# Patient Record
Sex: Female | Born: 1980 | Race: White | Hispanic: No | Smoking: Never smoker
Health system: Southern US, Community
[De-identification: ages and names within clinical notes are randomized; demographics above are authoritative.]

## PROBLEM LIST (undated history)

## (undated) DIAGNOSIS — E785 Hyperlipidemia, unspecified: Secondary | ICD-10-CM

## (undated) DIAGNOSIS — F419 Anxiety disorder, unspecified: Secondary | ICD-10-CM

## (undated) DIAGNOSIS — K469 Unspecified abdominal hernia without obstruction or gangrene: Secondary | ICD-10-CM

## (undated) DIAGNOSIS — G43909 Migraine, unspecified, not intractable, without status migrainosus: Secondary | ICD-10-CM

## (undated) DIAGNOSIS — F32A Depression, unspecified: Secondary | ICD-10-CM

## (undated) DIAGNOSIS — N200 Calculus of kidney: Secondary | ICD-10-CM

## (undated) DIAGNOSIS — F4001 Agoraphobia with panic disorder: Secondary | ICD-10-CM

## (undated) HISTORY — DX: Anxiety disorder, unspecified: F41.9

## (undated) HISTORY — DX: Unspecified abdominal hernia without obstruction or gangrene: K46.9

## (undated) HISTORY — PX: DIAGNOSTIC LAPAROSCOPY: SUR761

## (undated) HISTORY — DX: Calculus of kidney: N20.0

## (undated) HISTORY — DX: Migraine, unspecified, not intractable, without status migrainosus: G43.909

## (undated) HISTORY — DX: Depression, unspecified: F32.A

## (undated) HISTORY — DX: Agoraphobia with panic disorder: F40.01

## (undated) HISTORY — DX: Hyperlipidemia, unspecified: E78.5

---
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
## 2005-05-20 HISTORY — PX: TONSILLECTOMY AND ADENOIDECTOMY: SUR1326

## 2006-12-01 ENCOUNTER — Ambulatory Visit: Payer: Self-pay | Admitting: Urology

## 2010-09-05 ENCOUNTER — Other Ambulatory Visit (HOSPITAL_COMMUNITY): Payer: Self-pay | Admitting: General Surgery

## 2010-09-10 ENCOUNTER — Encounter: Payer: Federal, State, Local not specified - PPO | Attending: General Surgery | Admitting: *Deleted

## 2010-09-10 DIAGNOSIS — Z713 Dietary counseling and surveillance: Secondary | ICD-10-CM | POA: Insufficient documentation

## 2010-09-10 DIAGNOSIS — Z01818 Encounter for other preprocedural examination: Secondary | ICD-10-CM | POA: Insufficient documentation

## 2010-09-17 ENCOUNTER — Ambulatory Visit (HOSPITAL_COMMUNITY)
Admission: RE | Admit: 2010-09-17 | Discharge: 2010-09-17 | Disposition: A | Discharge status: Home / Self Care | Payer: BC Managed Care – PPO | Source: Ambulatory Visit | Attending: General Surgery | Admitting: General Surgery

## 2010-09-17 DIAGNOSIS — K7689 Other specified diseases of liver: Secondary | ICD-10-CM | POA: Insufficient documentation

## 2010-09-17 DIAGNOSIS — Z01810 Encounter for preprocedural cardiovascular examination: Secondary | ICD-10-CM | POA: Insufficient documentation

## 2010-09-17 DIAGNOSIS — Z01818 Encounter for other preprocedural examination: Secondary | ICD-10-CM | POA: Insufficient documentation

## 2010-10-19 ENCOUNTER — Encounter (INDEPENDENT_AMBULATORY_CARE_PROVIDER_SITE_OTHER): Payer: Self-pay | Admitting: General Surgery

## 2010-12-27 ENCOUNTER — Encounter: Payer: BC Managed Care – PPO | Attending: General Surgery

## 2010-12-27 DIAGNOSIS — Z01818 Encounter for other preprocedural examination: Secondary | ICD-10-CM | POA: Insufficient documentation

## 2010-12-27 DIAGNOSIS — Z713 Dietary counseling and surveillance: Secondary | ICD-10-CM | POA: Insufficient documentation

## 2010-12-27 NOTE — Progress Notes (Signed)
  Pre-Operative Nutrition Class  Patient was seen on 12/27/2010 for Pre-Operative Nutrition education at the [facility].   Surgery date: 01/08/11 Surgery type: LAGB  Weight today: 283.2 lbs Weight change: 7.6 lbs gain Total weight lost: N/A BMI: 45.7  Samples given per MNT protocol: Bariatric Advantage Multivitamin Lot # [id number] Exp: 12/12  Bariatric Advantage Calcium Citrate Lot # [id number] Exp: 9/13  Celebrate Vitamins Multivitamin Lot # [id number] Exp: 9/13  Celebrate VitaminsCalcium Citrate Lot #[id number] Exp: 4/13  The following the learning objective met by the patient during this course:   Identifies Pre-Op Dietary Goals and will begin 2 weeks pre-operatively   Identifies appropriate sources of fluids and proteins   States protein recommendations and appropriate sources pre and post-operatively  Identifies Post-Operative Dietary Goals and will follow for 2 weeks post-operatively  Identifies appropriate multivitamin and calcium sources  Describes the need for physical activity post-operatively and will follow MD recommendations  States when to call healthcare provider regarding medication questions or post-operative complications  Follow-Up Plan: Patient will follow-up at [facility] 2 weeks post operatively for diet advancement per MD.

## 2010-12-28 ENCOUNTER — Encounter (INDEPENDENT_AMBULATORY_CARE_PROVIDER_SITE_OTHER): Payer: Self-pay | Admitting: General Surgery

## 2010-12-28 ENCOUNTER — Ambulatory Visit (INDEPENDENT_AMBULATORY_CARE_PROVIDER_SITE_OTHER): Payer: BC Managed Care – PPO | Admitting: General Surgery

## 2010-12-28 VITALS — BP 110/86 | HR 80 | Ht 66.0 in | Wt 285.0 lb

## 2010-12-28 NOTE — Progress Notes (Signed)
Kristen [name] is a 29 y.o. female.    Chief Complaint  Patient presents with  . Bariatric Pre-op    HPI HPI 30 year old morbidly obese Caucasian female comes in today for her preoperative visit for lap band surgery. The patient is currently scheduled for surgery on August 21. I last saw her on September 05, 2010.  She denies any new hospitalizations or surgeries. She did see her urologist and was found to have an additional kidney stone that has not required any additional treatment. She just started her preoperative diet yesterday.  Past medical, surgical, family, and social histories are reviewed and unchanged.  Past Medical History  Diagnosis Date  . Kidney stone   . Hernia   . Wears glasses   . Anxiety   . Obesity, Class III, BMI 40-49.9 (morbid obesity)   . Migraine headache   . Dyslipidemia     not requiring meds  . Fatty liver disease, nonalcoholic     Past Surgical History  Procedure Date  . Hernia repair 1985  . Tonsillectomy and adenoidectomy 2007  . Intrauterine device insertion 2011    Family History  Problem Relation Age of Onset  . Hypertension Father   . Stroke Father   . Hyperlipidemia Father   . Migraines Mother   . Cancer Mother     cervical  . Hyperlipidemia Mother   . Bipolar disorder Sister   . Migraines Sister     Social History History  Substance Use Topics  . Smoking status: Never Smoker   . Smokeless tobacco: Not on file  . Alcohol Use: Yes     "socially"    No Known Allergies  Current Outpatient Prescriptions  Medication Sig Dispense Refill  . ALPRAZolam (XANAX) 1 MG tablet Take 1 mg by mouth at bedtime as needed.        Marland Kitchen amitriptyline (ELAVIL) 25 MG tablet Take 50 mg by mouth at bedtime.       Marland Kitchen levonorgestrel (MIRENA) 20 MCG/24HR IUD 1 each by Intrauterine route once.          Review of Systems Review of Systems  Constitutional: Negative for fever, chills and weight loss.  HENT: Negative for nosebleeds and sore throat.     Eyes: Negative for blurred vision and double vision.  Respiratory: Negative for shortness of breath.   Cardiovascular: Negative for chest pain, orthopnea, leg swelling and PND.  Gastrointestinal: Negative for nausea, vomiting, diarrhea, constipation, blood in stool and melena.  Genitourinary: Negative for dysuria and hematuria.       H/o kidney stones  Musculoskeletal: Positive for joint pain (some occass wrist pain).  Skin: Negative for itching and rash.  Neurological: Positive for headaches (occass migraines).  Endo/Heme/Allergies: Negative.   Psychiatric/Behavioral: Negative.     Physical Exam Physical Exam  Vitals reviewed. Constitutional: She is oriented to person, place, and time. She appears well-developed and well-nourished.       obese  HENT:  Head: Normocephalic and atraumatic.  Eyes: Conjunctivae are normal. No scleral icterus.  Neck: Normal range of motion. Neck supple. No thyromegaly present.  Cardiovascular: Normal rate, regular rhythm and normal heart sounds.   Respiratory: Effort normal and breath sounds normal. She has no wheezes.  GI: Soft. Bowel sounds are normal. She exhibits no distension and no mass. There is no tenderness. There is no guarding.       Obese; waistline below umbilicus  Musculoskeletal: Normal range of motion.  Neurological: She is alert and oriented to person,  place, and time. She exhibits abnormal muscle tone.  Skin: Skin is warm and dry. No rash noted.  Psychiatric: She has a normal mood and affect. Her behavior is normal. Judgment and thought content normal.     Blood pressure 110/86, pulse 80, height 5\' 6"  (1.676 m), weight 285 lb (129.275 kg).  DATA REVIEWED: UPPER GI SERIES WITH KUB 09/05/2010  Technique: Routine upper GI series was performed with thin and  high density barium.  Comparison: None.  Findings: The KUB reveals a normal stool and bowel gas pattern.  There is no gross organomegaly. The osseous structures are   unremarkable.  The esophagus, stomach, duodenal bulb, and remainder of the C-loop  have a normal appearance with no evidence of stricture, fixed  filling defect, ulceration.   IMPRESSION:  Normal upper GI.  ABDOMINAL ULTRASOUND COMPLETE 09/05/10 Comparison: None.  Findings:  Gallbladder: No gallstones, gallbladder wall thickening, or  pericholecystic fluid.  Common Bile Duct: Within normal limits in caliber. Measures 3 mm.  Liver: No focal mass lesion identified. There is diffuse fatty  infiltration of the liver.  IVC: Appears normal.  Pancreas: No abnormality identified.  Spleen: Within normal limits in size and echotexture.  Right kidney: Normal in size and parenchymal echogenicity. No  evidence of mass or hydronephrosis.  Left kidney: Normal in size and parenchymal echogenicity. No  evidence of mass or hydronephrosis.  Abdominal Aorta: No aneurysm identified.   IMPRESSION: There is diffuse fatty infiltration the liver.   LABS from 09/06/10; ldl 138, ast49, alt 95, cholesterol 203 otherwise normal.     Assessment/Plan 30 year old morbidly obese Caucasian female with a BMI of 46 his comorbidities include dyslipidemia not requiring medications, bilateral wrist pain, fatty liver infiltration, and migraine headaches who is currently scheduled for laparoscopic adjustable gastric band placement on August 21.  I reviewed her upper GI and abdominal ultrasound with the patient.  I encouraged her to be compliant with her preoperative diet.  We discussed the risk and benefits of surgery again. We also discussed the typical postoperative course.  Mary Sella. Andrey Campanile, MD  Atilano Ina 12/28/2010, 6:06 PM

## 2010-12-28 NOTE — Patient Instructions (Signed)
Follow your preop diet as outlined by the nutritionist.  Try to exercise over the next week.

## 2011-01-01 ENCOUNTER — Encounter (HOSPITAL_COMMUNITY): Payer: BC Managed Care – PPO

## 2011-01-01 ENCOUNTER — Other Ambulatory Visit (INDEPENDENT_AMBULATORY_CARE_PROVIDER_SITE_OTHER): Payer: Self-pay | Admitting: General Surgery

## 2011-01-01 LAB — CBC
MCH: 31.3 pg (ref 26.0–34.0)
MCHC: 35.5 g/dL (ref 30.0–36.0)
MCV: 88.3 fL (ref 78.0–100.0)
Platelets: 288 10*3/uL (ref 150–400)

## 2011-01-01 LAB — DIFFERENTIAL
Basophils Relative: 0 % (ref 0–1)
Eosinophils Absolute: 0.1 10*3/uL (ref 0.0–0.7)
Eosinophils Relative: 1 % (ref 0–5)
Monocytes Relative: 8 % (ref 3–12)
Neutrophils Relative %: 53 % (ref 43–77)

## 2011-01-01 LAB — SURGICAL PCR SCREEN
MRSA, PCR: NEGATIVE
Staphylococcus aureus: POSITIVE — AB

## 2011-01-01 LAB — COMPREHENSIVE METABOLIC PANEL
ALT: 134 U/L — ABNORMAL HIGH (ref 0–35)
Calcium: 9.7 mg/dL (ref 8.4–10.5)
Creatinine, Ser: 0.48 mg/dL — ABNORMAL LOW (ref 0.50–1.10)
GFR calc Af Amer: >60 mL/min (ref >60)
GFR calc non Af Amer: >60 mL/min (ref >60)
Glucose, Bld: 86 mg/dL (ref 70–99)
Sodium: 137 mEq/L (ref 135–145)
Total Protein: 7.4 g/dL (ref 6.0–8.3)

## 2011-01-01 LAB — HCG, SERUM, QUALITATIVE: Preg, Serum: NEGATIVE

## 2011-01-07 ENCOUNTER — Telehealth (INDEPENDENT_AMBULATORY_CARE_PROVIDER_SITE_OTHER): Payer: Self-pay | Admitting: General Surgery

## 2011-01-07 NOTE — Telephone Encounter (Signed)
Message copied by Liliana Cline on Mon Jan 07, 2011  2:58 PM ------      Message from: Andrey Campanile, ERIC M      Created: Mon Jan 07, 2011  2:29 PM       Labs ok for surgery

## 2011-01-07 NOTE — Telephone Encounter (Signed)
Labs ok for surgery faxed to [facility].

## 2011-01-08 ENCOUNTER — Ambulatory Visit (HOSPITAL_COMMUNITY)
Admission: RE | Admit: 2011-01-08 | Discharge: 2011-01-09 | Disposition: A | Discharge status: Home / Self Care | Payer: BC Managed Care – PPO | Source: Ambulatory Visit | Attending: General Surgery | Admitting: General Surgery

## 2011-01-08 ENCOUNTER — Ambulatory Visit (HOSPITAL_COMMUNITY): Payer: BC Managed Care – PPO

## 2011-01-08 DIAGNOSIS — Z79899 Other long term (current) drug therapy: Secondary | ICD-10-CM | POA: Insufficient documentation

## 2011-01-08 DIAGNOSIS — E785 Hyperlipidemia, unspecified: Secondary | ICD-10-CM | POA: Insufficient documentation

## 2011-01-08 DIAGNOSIS — M25539 Pain in unspecified wrist: Secondary | ICD-10-CM | POA: Insufficient documentation

## 2011-01-08 DIAGNOSIS — Z6841 Body Mass Index (BMI) 40.0 and over, adult: Secondary | ICD-10-CM

## 2011-01-08 DIAGNOSIS — Z01812 Encounter for preprocedural laboratory examination: Secondary | ICD-10-CM | POA: Insufficient documentation

## 2011-01-08 DIAGNOSIS — K7689 Other specified diseases of liver: Secondary | ICD-10-CM | POA: Insufficient documentation

## 2011-01-09 NOTE — Op Note (Signed)
Kristen, [name] NO.:  [id number]  MEDICAL RECORD NO.:  [id number]  LOCATION:  1524                         FACILITY:  [facility]  PHYSICIAN:  Mary Sella. Andrey Campanile, MD     DATE OF BIRTH:  08/17/1980  DATE OF PROCEDURE:  01/08/2011 DATE OF DISCHARGE:                              OPERATIVE REPORT   PREOPERATIVE DIAGNOSES: 1. Morbid obesity, body mass index 46. 2. Fatty liver disease. 3. Dyslipidemia. 4. Bilateral wrist pain.  POSTOPERATIVE DIAGNOSES: 1. Morbid obesity, body mass index 46. 2. Fatty liver disease. 3. Dyslipidemia. 4. Bilateral wrist pain.  PROCEDURE:  Laparoscopic adjustable gastric band placement.  SURGEON:  Gaynelle Adu, MD  ASSISTANT SURGEON:  Sandria Bales. Ezzard Standing, M.D.  ANESTHESIA:  General plus 30 mL Exparel.  FINDINGS:  We used an AP Standard band.  There was no hiatal hernia.  It was placed using the pars flaccida technique.  ESTIMATED BLOOD LOSS:  Minimal.  DRAINS:  None.  INDICATIONS FOR PROCEDURE:  The patient is a 30 year old morbidly obese Caucasian female, who has been unsuccessful at sustained weight loss. Her comorbidities include fatty liver disease, dyslipidemia, bilateral wrist pain, and migraine headaches.  We discussed the risks and benefits of surgery including but not limited to bleeding, infection, injury to surrounding structure, blood clot formation, failure to lose weight, band slippage, band erosion, gastroesophageal reflux disease, nutritional and vitamin deficiencies, and port complications.  She elects to proceed to the surgery.  DESCRIPTION OF PROCEDURE:  After obtaining informed consent and the patient receiving 5000 units of subcutaneous heparin, the patient was taken to the operating room, placed supine on the operating table. Sequential compression device was placed.  General endotracheal anesthesia was established.  Her abdomen was prepped and draped in usual standard surgical fashion.  She received 2 g  of cefoxitin.  A surgical time-out was performed.  Her abdomen was prepped and draped in usual standard surgical fashion. I gained entry to her abdominal cavity using Optiview technique in the left upper quadrant.  A 1 cm incision was made 2 fingerbreadths below the left subcostal margin with a #11 blade and using a 5-mm camera with a 5 mm trocar advanced through all layers of the abdominal wall on direct visualization until I gained entry to the abdominal cavity. Pneumoperitoneum was smoothly established up to a patient pressure of 15 mmHg.  There was no evidence of injury to surrounding structures.  I placed an 11 mm trocar slightly to the left, the umbilicus, 15 mm trocar 2 fingerbreadths below the right subcostal margin, a 5 mm trocar in the subxiphoid position, and a 10 mm trocar in the right midabdomen, all under direct visualization.  The patient was then placed in reverse Trendelenburg.  The subxiphoid trocar was removed and Nathanson liver retractor was placed to lift up the left lobe of the liver and this was secured to the Iron Intern.  We identified the gastroesophageal junction and visualized the stomach.  There was no aberrant anatomy.  Then using Bovie  hook electrocautery, I incised the angle of His.  Then taking the band passer, I had gently dissected downward at the angle of His to expose  some of the left crus.  I did had  Anesthesia, passed the calibration tube into the stomach.  We inflated the balloon with 15 cc of air and pulled back.  On it, there was very good resistance at the GE junction.  The calibration tube was deflated and re-advanced into the stomach, 10 cc of air was placed into the calibration balloon, and the calibration balloon and tube was pulled back and there was very good resistance at the GE junction.  There was no evidence of hiatal hernia. The balloon was let down and the calibration tubing was pulled back in to the esophagus.  I incised the  gastrohepatic ligament with a Bovie electrocautery.  I identified the inferior vena cava and the anterior bridging fat.  A small amount of cautery was used to incise the fat at the level of the bridging fat.  Then using the band passer, I passed the band passer in retrogastric fashion and the pars flaccida technique toward the angle of His.  It passed quite easily.  The AP Standard band had been prepared according to package directions.  The band was inserted into the abdominal cavity.  The band tubing was threaded through the end of the band passer and brought back behind the stomach. We then advanced the calibration tubing back into the stomach.  I threaded the end of the band tubing through the buckle and cinched down the band until it was secured with a black tab locked into position. The calibration tubing was removed from the patient's stomach and discarded.  I then placed 3 gastro-gastric imbrication sutures in order to help prevent band slippage.  This was done with 3 interrupted 2-0 Ethibond sutures.  The band tubing was then brought out to the right midabdominal trocar.  The [facility] liver retractor was removed under direct visualization.  There was no evidence of injury to surrounding structures.  It should be noted that the patient did have gross evidence of fatty replacement of her liver.  The pneumoperitoneum was released and remaining trocars were removed.  The band tubing was trimmed at the end and connected to the port.  A 1 inch piece of Vicryl mesh had been sutured to the back of the port in 4 corners.  Once the tubing and the port were connected, the excess band tubing was threaded back into the abdominal cavity.  I enlarged the skin incision and created a pocket in the subcutaneous tissue out lateral from the incision.  The port was well seated in the subcutaneous tissue.  I then close that wound in 2 layers, first with interrupted inverted 2-0 Vicryl and then a  4-0 Monocryl in a subcuticular fashion.  The remaining skin incisions were closed with 4-0 Monocryl in a subcuticular fashion.  Prior to closing the skin incisions, 30 cc of Exparel was injected around all trocar sites.  Dermabond was applied. The patient was extubated and taken to the recovery room in stable addition.  All needle, instrument, and sponge counts were correct x2. There are no immediate complications.     Mary Sella. Andrey Campanile, MD     EMW/MEDQ  D:  01/08/2011  T:  01/08/2011  Job:  [id number]  cc:   [name], MD Fax: [phone]  Electronically Signed by Gaynelle Adu M.D. on 01/09/2011 09:17:54 AM

## 2011-01-22 ENCOUNTER — Encounter: Payer: BC Managed Care – PPO | Attending: General Surgery

## 2011-01-22 DIAGNOSIS — Z09 Encounter for follow-up examination after completed treatment for conditions other than malignant neoplasm: Secondary | ICD-10-CM | POA: Insufficient documentation

## 2011-01-22 DIAGNOSIS — Z713 Dietary counseling and surveillance: Secondary | ICD-10-CM | POA: Insufficient documentation

## 2011-01-22 DIAGNOSIS — Z9884 Bariatric surgery status: Secondary | ICD-10-CM | POA: Insufficient documentation

## 2011-01-22 NOTE — Progress Notes (Signed)
  2 Week Post-Operative Nutrition Class  Patient was seen on 01/22/2011 for Post-Operative Nutrition education at the [facility].   Surgery date: 01/08/11 Surgery type: LAGB  Weight today: 267.1lb Weight change: 16.1 Total weight lost: 16.1 BMI: 43.1  The following the learning objective met the patient during this course:   Identifies Phase 3A (Soft, High Proteins) Dietary Goals and will begin from 2 weeks post-operatively to 2 months post-operatively   Identifies appropriate sources of fluids and proteins   States protein recommendations and appropriate sources post-operatively  Identifies the need for appropriate texture modifications, mastication, and bite sizes when consuming solids  Identifies appropriate multivitamin and calcium sources post-operatively  Describes the need for physical activity post-operatively and will follow MD recommendations  States when to call healthcare provider regarding medication questions or post-operative complications  Handouts given during class include:  Phase 3A: Soft, High Protein Diet Handout  Band Fill Guidelines Handout  Follow-Up Plan: Patient will follow-up at [facility] in 6 weeks for 2 months post-op nutrition visit for diet advancement per MD.

## 2011-01-22 NOTE — Patient Instructions (Signed)
Patient to follow Phase 3A-Soft, High Protein Diet and follow-up at [facility] in 6 weeks for 2 months post-op nutrition visit for diet advancement. 

## 2011-01-25 ENCOUNTER — Encounter (INDEPENDENT_AMBULATORY_CARE_PROVIDER_SITE_OTHER): Payer: Self-pay | Admitting: General Surgery

## 2011-01-25 ENCOUNTER — Ambulatory Visit (INDEPENDENT_AMBULATORY_CARE_PROVIDER_SITE_OTHER): Payer: BC Managed Care – PPO | Admitting: General Surgery

## 2011-01-25 VITALS — BP 114/76 | Ht 66.0 in | Wt 269.6 lb

## 2011-01-25 DIAGNOSIS — Z9884 Bariatric surgery status: Secondary | ICD-10-CM

## 2011-01-25 DIAGNOSIS — Z09 Encounter for follow-up examination after completed treatment for conditions other than malignant neoplasm: Secondary | ICD-10-CM

## 2011-01-25 NOTE — Progress Notes (Signed)
Chief complaint: Postop  Procedure: Status post laparoscopic adjustable gastric band placement with AP standard band on January 08, 2011  History of Present Ilness: 30 year old female presents today for her first postoperative appointment. She denies any fevers or chills. She states that she has some soreness at her incision sites. She is a longer taking any narcotics. She is returned to work. She denies any nausea, vomiting, regurgitation, or reflux. She has been advanced to solid proteins.  She denies any diarrhea or constipation. She is walking every day for about 27 minutes at a time.  Physical Exam: BP 114/76  Ht 5\' 6"  (1.676 m)  Wt 269 lb 9.6 oz (122.29 kg)  BMI 43.51 kg/m2  Well-developed well-nourished morbidly obese Caucasian female in no apparent distress Pulmonary-lungs are clear to auscultation Cardiac-regular rate and rhythm Abdomen-soft, nondistended, well-healed trocar incisions. No signs of incisional hernia. She has a little bit of bruising around her port trocar site. There is no cellulitis. Skin-no jaundice or edema   Assessment and Plan: Status post laparoscopic adjustable gastric band placement-doing great.  She has lost 15.4 pounds in the 2-1/2 weeks since surgery. I congratulated her on her weight loss. I encouraged her to continue to exercise on a daily basis. I also encouraged her to do high intensity interval fast pace walking.  I will see her in 2 weeks when we will do her first adjustment

## 2011-01-25 NOTE — Patient Instructions (Signed)
Try to increase exercise intensity.  Walk everyday with short bursts of high intensity for 30 sec then resume normal pace for 1-2 minutes & repeat short interval burst of high intensity

## 2011-02-11 ENCOUNTER — Other Ambulatory Visit (INDEPENDENT_AMBULATORY_CARE_PROVIDER_SITE_OTHER): Payer: Self-pay | Admitting: General Surgery

## 2011-02-15 ENCOUNTER — Encounter (INDEPENDENT_AMBULATORY_CARE_PROVIDER_SITE_OTHER): Payer: Self-pay | Admitting: General Surgery

## 2011-02-15 ENCOUNTER — Ambulatory Visit (INDEPENDENT_AMBULATORY_CARE_PROVIDER_SITE_OTHER): Payer: BC Managed Care – PPO | Admitting: General Surgery

## 2011-02-15 ENCOUNTER — Encounter (INDEPENDENT_AMBULATORY_CARE_PROVIDER_SITE_OTHER): Payer: BC Managed Care – PPO | Admitting: General Surgery

## 2011-02-15 VITALS — BP 132/90 | HR 80 | Temp 96.4°F | Ht 66.0 in | Wt 268.6 lb

## 2011-02-15 DIAGNOSIS — Z09 Encounter for follow-up examination after completed treatment for conditions other than malignant neoplasm: Secondary | ICD-10-CM

## 2011-02-15 DIAGNOSIS — Z9884 Bariatric surgery status: Secondary | ICD-10-CM

## 2011-02-15 NOTE — Patient Instructions (Signed)

## 2011-02-15 NOTE — Progress Notes (Signed)
Chief complaint: Postop  Procedure: Status post laparoscopic adjustable gastric band placement with AP standard band on January 08, 2011  History of Present Ilness: 30 year old female presents today for her second postoperative appointment. She denies any fevers or chills.  She denies any nausea, vomiting, regurgitation, or reflux. She has been advanced to solid proteins.  She denies any diarrhea or constipation. She is walking every day for about 27 minutes at a time. She has some stress at [employer] has been bought out.   Physical Exam: BP 132/90  Pulse 80  Temp(Src) 96.4 F (35.8 C) (Temporal)  Ht 5\' 6"  (1.676 m)  Wt 268 lb 9.6 oz (121.836 kg)  BMI 43.35 kg/m2  Well-developed well-nourished morbidly obese Caucasian female in no apparent distress Pulmonary-lungs are clear to auscultation Cardiac-regular rate and rhythm Abdomen-soft, nondistended, well-healed trocar incisions. No signs of incisional hernia. Port in the RUQ slightly above the incision. There is no cellulitis. Skin-no jaundice or edema   Assessment and Plan: Status post laparoscopic adjustable gastric band placement-doing great.  She has lost 16.4 pounds since surgery. . She has lost 1 pound since her last visit. I encouraged her to continue to exercise on a daily basis. I also encouraged her to do high intensity interval fast pace walking.  We did an adjustment today. Her abdominal wall was prepped with ChloraPrep. Then using a 22-gauge Huber needle aspirated her port in the right upper quadrant and injected 1 cc of saline. The patient was able to tolerate sips of water.  She was given a copy of our post-LAP-BAND adjustment instructions

## 2011-03-05 ENCOUNTER — Encounter: Payer: BC Managed Care – PPO | Attending: General Surgery | Admitting: *Deleted

## 2011-03-05 ENCOUNTER — Encounter: Payer: Self-pay | Admitting: *Deleted

## 2011-03-05 DIAGNOSIS — Z01818 Encounter for other preprocedural examination: Secondary | ICD-10-CM | POA: Insufficient documentation

## 2011-03-05 DIAGNOSIS — Z713 Dietary counseling and surveillance: Secondary | ICD-10-CM | POA: Insufficient documentation

## 2011-03-05 NOTE — Progress Notes (Signed)
  Follow-up visit: 8 Weeks Post-Operative LAGB Surgery  Medical Nutrition Therapy:  Appt start time: 1722 end time:  1755.  Assessment:  Primary concerns today: post-operative bariatric surgery nutrition management. Avalyn reports that she has not felt the restriction from her last fill. She is able to eat larger portions and admits to having high carbohydrate meals. Discussed avoiding potato soups and mashed potatoes and focusing on foods higher in protein and fiber.  Weight today: 267.0 lbs Weight change: 0.1 lbs Total weight lost: 16.2 lbs BMI: 43.2% Weight goal: 175 lbs  24-hr recall:  B (AM): Atkin's Shake Snk (10  AM): medium apple OR graham crackers (2 large squares)   L (12-1 PM): Meat (Grilled chicken or hamburger patty, 3 oz) OR Protein shake Snk (PM): N/A  D (6-8 PM): Grilled meat (chicken, beef, fish), 2 cup potatoes or corn OR Potato Soups Snk (PM): N/A  Fluid intake: water, crystal light, protein shake = 64-70oz Estimated total protein intake: 70g  Medications: No Supplementation: Taking regularly  Using straws: No Drinking while eating: No Hair loss: No Carbonated beverages: No N/V/D/C: None report Last Lap-Band fill: 1 fill on 9/28 per Dr. Andrey Campanile  Recent physical activity:  Started exercising 40 minutes, 6 times/week  Progress Towards Goal(s):  In progress.  Handouts given during visit include:  Phase 3B - High Protein + Non-starchy Vegetable   Nutritional Diagnosis:  [place]-3.3 Overweight/obesity As related to recent LAGB surgery.  As evidenced by pt attempting to follow LAGB guidelines for continued weight loss.    Intervention:  Nutrition education.  Monitoring/Evaluation:  Dietary intake, exercise, lap band fills, and body weight. Follow up in 1-2 months for 3-4 month post-op visit.

## 2011-03-08 ENCOUNTER — Other Ambulatory Visit (INDEPENDENT_AMBULATORY_CARE_PROVIDER_SITE_OTHER): Payer: Self-pay | Admitting: General Surgery

## 2011-03-14 ENCOUNTER — Ambulatory Visit (INDEPENDENT_AMBULATORY_CARE_PROVIDER_SITE_OTHER): Payer: BC Managed Care – PPO | Admitting: General Surgery

## 2011-03-14 ENCOUNTER — Encounter (INDEPENDENT_AMBULATORY_CARE_PROVIDER_SITE_OTHER): Payer: Self-pay | Admitting: General Surgery

## 2011-03-14 VITALS — BP 106/78 | HR 60 | Temp 97.2°F | Resp 20 | Ht 66.0 in | Wt 266.2 lb

## 2011-03-14 DIAGNOSIS — Z4651 Encounter for fitting and adjustment of gastric lap band: Secondary | ICD-10-CM

## 2011-03-14 DIAGNOSIS — Z09 Encounter for follow-up examination after completed treatment for conditions other than malignant neoplasm: Secondary | ICD-10-CM

## 2011-03-14 NOTE — Patient Instructions (Signed)

## 2011-04-09 NOTE — Progress Notes (Signed)
Chief complaint: Postop  Procedure: Status post laparoscopic adjustable gastric band placement with AP standard band on January 08, 2011  History of Present Ilness: 30 year old female presents today for her third  postoperative appointment. She denies any fevers or chills.  She denies any nausea, vomiting, regurgitation, or reflux. She has been advanced to solid proteins.  She denies any diarrhea or constipation. She hasn't been exercising that much. She still has some stress at [employer] has been bought out.    Physical Exam: BP 106/78  Pulse 60  Temp(Src) 97.2 F (36.2 C) (Temporal)  Resp 20  Ht 5\' 6"  (1.676 m)  Wt 266 lb 4 oz (120.77 kg)  BMI 42.97 kg/m2  Well-developed well-nourished morbidly obese Caucasian female in no apparent distress Pulmonary-lungs are clear to auscultation Cardiac-regular rate and rhythm Abdomen-soft, nondistended, well-healed trocar incisions. No signs of incisional hernia. Port in the RUQ slightly above the incision. There is no cellulitis. Skin-no jaundice or edema   Assessment and Plan: Status post laparoscopic adjustable gastric band placement-doing great, without much restriction yet.  She has lost 18.6 pounds since surgery. . She has lost 2 pounds since her last visit. I encouraged to increase her exercise on a daily basis. I also encouraged her to do high intensity interval fast pace walking.  We did an adjustment today. Her abdominal wall was prepped with ChloraPrep. Then using a 22-gauge Huber needle aspirated her port in the right upper quadrant and injected 1 cc of saline giving her a total predicted volume of 5 cc. The patient was able to tolerate sips of water.  She was given a copy of our post-LAP-BAND adjustment instructions  F/u 4 weeks.   Mary Sella. Andrey Campanile, MD, FACS General, Bariatric, & Minimally Invasive Surgery [facility], [place]

## 2011-04-17 ENCOUNTER — Ambulatory Visit: Payer: BC Managed Care – PPO | Admitting: *Deleted

## 2011-04-18 ENCOUNTER — Encounter (INDEPENDENT_AMBULATORY_CARE_PROVIDER_SITE_OTHER): Payer: Self-pay | Admitting: General Surgery

## 2011-04-18 ENCOUNTER — Ambulatory Visit (INDEPENDENT_AMBULATORY_CARE_PROVIDER_SITE_OTHER): Payer: BC Managed Care – PPO | Admitting: General Surgery

## 2011-04-18 VITALS — BP 116/86 | HR 80 | Temp 97.6°F | Resp 16 | Ht 66.0 in | Wt 262.0 lb

## 2011-04-18 DIAGNOSIS — E669 Obesity, unspecified: Secondary | ICD-10-CM

## 2011-04-18 NOTE — Progress Notes (Signed)
Chief complaint: Here for adjustment  Procedure: Status post laparoscopic adjustable gastric band placement with AP standard band on January 08, 2011  History of Present Ilness: 30 year old female presents today for her fourth postoperative appointment. She denies any fevers or chills.  She denies any nausea, vomiting, regurgitation, or reflux.  She denies any diarrhea or constipation. She hasn't been exercising that much. Stress at work-is still a significant issue; however, she has been walking a lot more at work. She states she can not eat as much as she previously could.  She can tell a difference in the amount she can eat after her last adjustment  Physical Exam: BP 116/86  Pulse 80  Temp(Src) 97.6 F (36.4 C) (Temporal)  Resp 16  Ht 5\' 6"  (1.676 m)  Wt 262 lb (118.842 kg)  BMI 42.29 kg/m2  See lapband flow sheet  Well-developed well-nourished morbidly obese Caucasian female in no apparent distress Pulmonary-lungs are clear to auscultation Cardiac-regular rate and rhythm Abdomen-soft, nondistended, well-healed trocar incisions. No signs of incisional hernia. Port in the RUQ slightly above the incision. There is no cellulitis. Skin-no jaundice or edema   Assessment and Plan: Status post laparoscopic adjustable gastric band placement-doing great, without much restriction yet.  She has lost 23 pounds since surgery. . She has lost 4.4 pounds since her last visit. I encouraged to increase her exercise on a daily basis. I also encouraged her to do high intensity interval fast pace walking.  After obtaining verbal consent, the abdominal wall was prepped with Chloraprep. The port was accessed with a Huber needle and 0.5 cc of saline was added to give the patient an expected fill volume of 5.5 cc.  The patient was able to tolerate sips of water. Marland Kitchen  She was given a copy of our post-LAP-BAND adjustment instructions  F/u 4 weeks.   Mary Sella. Andrey Campanile, MD, FACS General, Bariatric, &  Minimally Invasive Surgery [facility], [place]

## 2011-04-18 NOTE — Patient Instructions (Signed)

## 2011-05-09 ENCOUNTER — Ambulatory Visit (INDEPENDENT_AMBULATORY_CARE_PROVIDER_SITE_OTHER): Payer: BC Managed Care – PPO | Admitting: General Surgery

## 2011-05-09 ENCOUNTER — Encounter (INDEPENDENT_AMBULATORY_CARE_PROVIDER_SITE_OTHER): Payer: Self-pay | Admitting: General Surgery

## 2011-05-09 VITALS — BP 124/86 | HR 92 | Temp 98.6°F | Resp 18 | Ht 66.0 in | Wt 254.0 lb

## 2011-05-09 DIAGNOSIS — Z4651 Encounter for fitting and adjustment of gastric lap band: Secondary | ICD-10-CM | POA: Insufficient documentation

## 2011-05-09 NOTE — Patient Instructions (Signed)

## 2011-05-09 NOTE — Progress Notes (Signed)
Chief complaint: Here for adjustment  Procedure: Status post laparoscopic adjustable gastric band placement with AP standard band on January 08, 2011  History of Present Ilness: 30 year old female presents today for her fifth postoperative appointment. She denies any fevers or chills.  She denies any nausea, vomiting, or reflux.  She denies any diarrhea or constipation. She has been exercising everyday while at work by walking around the building as a stress reliever. She reports 2 episodes of regurgitation and that was with steak that she didn't chew thoroughly. Stress at work-is still a significant issue; however, she has been walking a lot more at work. She states she can not eat as much as she previously could.  She couldn't tell a big difference in the amount she could eat after her last adjustment.   Physical Exam: BP 124/86  Pulse 92  Temp(Src) 98.6 F (37 C) (Temporal)  Resp 18  Ht 5\' 6"  (1.676 m)  Wt 254 lb (115.214 kg)  BMI 41.00 kg/m2  See lapband flow sheet  Well-developed well-nourished morbidly obese Caucasian female in no apparent distress Pulmonary-lungs are clear to auscultation Cardiac-regular rate and rhythm Abdomen-soft, nondistended, well-healed trocar incisions. No signs of incisional hernia. Port in the RUQ slightly above the incision. There is no cellulitis. Skin-no jaundice or edema   Assessment and Plan: Status post laparoscopic adjustable gastric band placement-doing great, without much restriction yet.  She has lost 31 pounds since surgery. She has lost 8 pounds since her last visit. I congratulated her on increasing her exercise and encouraged her to keep up with it.   After obtaining verbal consent, the abdominal wall was prepped with Chloraprep. The port was accessed with a Huber needle and 0.5 cc of saline was added to give the patient an expected fill volume of 6 cc.  The patient was able to tolerate sips of water. Marland Kitchen  She was given a copy of our  post-LAP-BAND adjustment instructions  F/u 4 weeks.   Mary Sella. Andrey Campanile, MD, FACS General, Bariatric, & Minimally Invasive Surgery [facility], [place]

## 2011-05-27 ENCOUNTER — Encounter (INDEPENDENT_AMBULATORY_CARE_PROVIDER_SITE_OTHER): Payer: Self-pay | Admitting: General Surgery

## 2011-05-27 ENCOUNTER — Ambulatory Visit (INDEPENDENT_AMBULATORY_CARE_PROVIDER_SITE_OTHER): Payer: BC Managed Care – PPO | Admitting: General Surgery

## 2011-05-27 VITALS — BP 126/80 | HR 68 | Temp 97.4°F | Resp 18 | Ht 66.0 in | Wt 246.2 lb

## 2011-05-27 DIAGNOSIS — Z4651 Encounter for fitting and adjustment of gastric lap band: Secondary | ICD-10-CM

## 2011-05-27 NOTE — Patient Instructions (Signed)

## 2011-05-27 NOTE — Progress Notes (Signed)
Chief complaint: I think my band is too tight  History of present illness: 31 year old Caucasian female status post laparoscopic adjustable gastric band placement in August 2012 comes in complaining of regurgitation. I last saw her on December 20. At that time we did an adjustment and added 0.5 cc to give her an expected volume of 6 cc. She states since she was last seen she has only been able to tolerate liquids. She has not been able to solids without regurgitation. At most she can only get down one protein shake a day. She denies any fevers or chills. She denies any lightheadedness or dizziness.  BP 126/80  Pulse 68  Temp(Src) 97.4 F (36.3 C) (Temporal)  Resp 18  Ht 5\' 6"  (1.676 m)  Wt 246 lb 3.2 oz (111.676 kg)  BMI 39.74 kg/m2  See LapBand flowsheet  She has lost 7.8 pounds since her last visit on December 20  Gen: alert, NAD, non-toxic appearing HEENT: normocephalic, atraumatic; pupils equal, no scleral icterus, neck supple, no lymphadenopathy Pulm: Lungs clear to auscultation, symmetric chest rise CV: regular rate and rhythm Abd: soft, nontender, nondistended. Well-healed trocar sites. No incisional hernia. Port is in right mid-abdomen Ext: no edema, normal, symmetric strength Neuro: nonfocal, sensation grossly intact Psych: appropriate, judgment normal  Assessment and plan: 31 year old Caucasian female status post laparoscopic adjustable gastric band-band to tight  After obtaining verbal consent, the abdominal wall was prepped with Chloraprep. The port was accessed with a Huber needle and 0.3 cc of saline was removed to give the patient an expected fill volume of 5.7 cc.  The patient was able to tolerate sips of water.  Keep appt for 1/28.  Kristen [name]. Andrey Campanile, MD, FACS General, Bariatric, & Minimally Invasive Surgery [facility], [place]

## 2011-06-17 ENCOUNTER — Encounter (INDEPENDENT_AMBULATORY_CARE_PROVIDER_SITE_OTHER): Payer: BC Managed Care – PPO | Admitting: General Surgery

## 2011-07-12 ENCOUNTER — Encounter (INDEPENDENT_AMBULATORY_CARE_PROVIDER_SITE_OTHER): Payer: BC Managed Care – PPO | Admitting: General Surgery

## 2011-08-14 ENCOUNTER — Encounter (INDEPENDENT_AMBULATORY_CARE_PROVIDER_SITE_OTHER): Payer: Self-pay | Admitting: General Surgery

## 2011-08-14 ENCOUNTER — Ambulatory Visit (INDEPENDENT_AMBULATORY_CARE_PROVIDER_SITE_OTHER): Payer: BC Managed Care – PPO | Admitting: General Surgery

## 2011-08-14 VITALS — BP 128/88 | HR 92 | Temp 97.0°F | Resp 16 | Ht 66.0 in | Wt 228.0 lb

## 2011-08-14 DIAGNOSIS — E669 Obesity, unspecified: Secondary | ICD-10-CM

## 2011-08-14 DIAGNOSIS — Z9884 Bariatric surgery status: Secondary | ICD-10-CM

## 2011-08-14 NOTE — Progress Notes (Signed)
CC:  Lap band followup  HPI: 31 year old obese Caucasian female comes in for long-term followup. She underwent laparoscopic adjustment gastric band surgery on January 08, 2011. I last saw her on January 7. At that time she was having some significant regurgitation and we removed 0.3 cc from her band. Since that time she reports significant improvement. She is now able to eat solid food. She denies any heartburn or reflux. She denies any wheezing or morning cough. She denies any diarrhea or constipation. She states that she will still have some occasional regurgitation. It will only occur if she eats too rapidly and doesn't chew thoroughly. Since she was last seen, she has quit her job. She states that she has been exercising very little if at all  PMHx, PSHx, SOCHx, FAMHx, ALL reviewed and unchanged  PE: BP 128/88  Pulse 92  Temp(Src) 97 F (36.1 C) (Temporal)  Resp 16  Ht 5\' 6"  (1.676 m)  Wt 228 lb (103.42 kg)  BMI 36.80 kg/m2 BP 128/88  Pulse 92  Temp(Src) 97 F (36.1 C) (Temporal)  Resp 16  Ht 5\' 6"  (1.676 m)  Wt 228 lb (103.42 kg)  BMI 36.80 kg/m2  See LapBand flowsheet  Gen: alert, NAD, non-toxic appearing HEENT: normocephalic, atraumatic; pupils equal, no scleral icterus, neck supple, no lymphadenopathy Pulm: Lungs clear to auscultation, symmetric chest rise CV: regular rate and rhythm Abd: soft, nontender, nondistended. Well-healed trocar sites. No incisional hernia. Port is in right mid-abdomen Ext: no edema, normal, symmetric strength Neuro: nonfocal, sensation grossly intact Psych: appropriate, judgment normal  A/P: 31 year old Caucasian female status post laparoscopic adjustable gastric band surgery  Total weight loss since surgery 57 pounds Weight loss since last visit 18.2 pounds  I congratulated her weight loss. We did not do an adjustment today. I advised her that in order to maximize her weight loss she needed to start exercising more frequently and  regularly. We discussed the belt program. I made a referral to the belt program  Followup 4-6 weeks  Mary Sella. Andrey Campanile, MD, FACS General, Bariatric, & Minimally Invasive Surgery [facility], [place]

## 2011-08-14 NOTE — Patient Instructions (Signed)
Start exercising to help with your weight loss

## 2011-09-18 ENCOUNTER — Encounter (INDEPENDENT_AMBULATORY_CARE_PROVIDER_SITE_OTHER): Payer: BC Managed Care – PPO | Admitting: General Surgery

## 2011-10-21 ENCOUNTER — Emergency Department: Payer: Self-pay | Admitting: *Deleted

## 2011-10-21 ENCOUNTER — Ambulatory Visit: Payer: Self-pay | Admitting: Urology

## 2011-10-21 LAB — CBC
HCT: 40.9 % (ref 35.0–47.0)
MCH: 31.7 pg (ref 26.0–34.0)
MCV: 94 fL (ref 80–100)
RBC: 4.36 10*6/uL (ref 3.80–5.20)
RDW: 12.8 % (ref 11.5–14.5)

## 2011-10-21 LAB — URINALYSIS, COMPLETE
Bilirubin,UR: NEGATIVE
Glucose,UR: NEGATIVE mg/dL (ref 0–75)
Leukocyte Esterase: NEGATIVE
Nitrite: POSITIVE
Ph: 5 (ref 4.5–8.0)
Protein: 100
Specific Gravity: 1.025 (ref 1.003–1.030)
WBC UR: 27 /HPF (ref 0–5)

## 2011-10-21 LAB — BASIC METABOLIC PANEL
BUN: 9 mg/dL (ref 7–18)
Calcium, Total: 9.3 mg/dL (ref 8.5–10.1)
Chloride: 105 mmol/L (ref 98–107)
Glucose: 118 mg/dL — ABNORMAL HIGH (ref 65–99)
Osmolality: 275 (ref 275–301)
Sodium: 138 mmol/L (ref 136–145)

## 2011-10-23 LAB — URINE CULTURE

## 2011-11-29 ENCOUNTER — Encounter (INDEPENDENT_AMBULATORY_CARE_PROVIDER_SITE_OTHER): Payer: Self-pay | Admitting: General Surgery

## 2011-11-29 ENCOUNTER — Ambulatory Visit (INDEPENDENT_AMBULATORY_CARE_PROVIDER_SITE_OTHER): Payer: BC Managed Care – PPO | Admitting: General Surgery

## 2011-11-29 VITALS — BP 98/74 | HR 68 | Temp 98.4°F | Resp 14 | Ht 66.0 in | Wt 191.0 lb

## 2011-11-29 DIAGNOSIS — Z4651 Encounter for fitting and adjustment of gastric lap band: Secondary | ICD-10-CM

## 2011-11-29 NOTE — Progress Notes (Signed)
Subjective:     Patient ID: Kristen [name], female   DOB: 1981/03/22, 31 y.o.   MRN: [id number]  HPI 31 year old Caucasian female comes in for long-term followup after undergoing laparoscopic adjustable gastric band placement in August 2012.  She had an AP standard band placed. She was last seen in the office on March 27. At that time she did not have an adjustment performed. She had not been exercising at that time as well. We referred her to the BELT program. Since then she has completed the on line version of the belt program. She states that she really enjoyed the program and is very pleased that she participated in the program. She states that he's been exercising on regular basis. However for about the past 2 months she has had intolerance to solid foods. She  Has been regurgitating solid foods. She is mainly taking in liquids as well as protein shakes and soups. And thick mashed potatoes also causes a problem. She denies any morning cough or reflux. She also complains of some hair thinning. She reports daily bowel movements.  Review of Systems     Objective:   Physical Exam BP 98/74  Pulse 68  Temp 98.4 F (36.9 C) (Temporal)  Resp 14  Ht 5\' 6"  (1.676 m)  Wt 191 lb (86.637 kg)  BMI 30.83 kg/m2 BP 98/74  Pulse 68  Temp 98.4 F (36.9 C) (Temporal)  Resp 14  Ht 5\' 6"  (1.676 m)  Wt 191 lb (86.637 kg)  BMI 30.83 kg/m2  See LapBand flowsheet  Gen: alert, NAD, non-toxic appearing HEENT: normocephalic, atraumatic; pupils equal, no scleral icterus, neck supple, no lymphadenopathy Pulm: Lungs clear to auscultation, symmetric chest rise CV: regular rate and rhythm Abd: soft, nontender, nondistended. Well-healed trocar sites. No incisional hernia. Port is in right mid-abdomen Ext: no edema, normal, symmetric strength Neuro: nonfocal, sensation grossly intact Psych: appropriate, judgment normal     Assessment:     Obesity Status post laparoscopic adjustable gastric band placement    Plan:     Total weight loss to date - 94 pounds  Since she is having regurgitation to solid foods, I have recommended removing some fluid from her band. She is very concerned about the amount that I plan to withdraw.    After obtaining verbal consent, the abdominal wall was prepped with Chloraprep. The port was accessed with a Huber needle and 0.15 cc of saline was removed to give the patient an expected fill volume of 5.5 cc.  The patient was able to tolerate sips of water.  She was encouraged to increase her protein intake because of hair thinning. She was also encouraged to continue with her daily exercising. I encouraged her to continue with her daily multivitamin. She was instructed to call the office if she has ongoing intolerance to solid foods. Followup 3 months  Mary Sella. Andrey Campanile, MD, FACS General, Bariatric, & Minimally Invasive Surgery [facility], [place]

## 2011-11-29 NOTE — Patient Instructions (Signed)
1. Stay on liquids for the next 1 day as you adapt to your new fill volume.  Then resume your previous diet. 2. Decreasing your carbohydrate intake will hasten you weight loss.  Rely more on proteins for your meals.  Avoid condiments that contain sweets such as Honey Mustard and sugary salad dressings.   3. Stay in the "green zone".  If you are regurgitating with meals, having night time reflux, and find yourself eating soft comfort foods (mashed potatoes, potato chips)...realize that you are developing "maladaptive eating".  You will not lose weight this way and may regain weight.  The GREEN ZONE is eating smaller portions and not regurgitating.  Hence we may need to withdraw fluid from your band. 4. Build exercise into your daily routine.  Walking is the best way to start but do something every day if you can.   5.  CONTINUE WITH YOUR EXERCISE REGIMEN 6. CALL IF YOU ARE STILL REGURGITATING 7. Increase the protein in your diet (protein shakes, chicken, fish, tuna, etc)

## 2012-02-13 ENCOUNTER — Encounter (INDEPENDENT_AMBULATORY_CARE_PROVIDER_SITE_OTHER): Payer: Self-pay | Admitting: General Surgery

## 2012-04-22 ENCOUNTER — Ambulatory Visit (INDEPENDENT_AMBULATORY_CARE_PROVIDER_SITE_OTHER): Payer: BC Managed Care – PPO | Admitting: General Surgery

## 2012-04-22 ENCOUNTER — Encounter (INDEPENDENT_AMBULATORY_CARE_PROVIDER_SITE_OTHER): Payer: Self-pay | Admitting: General Surgery

## 2012-04-22 VITALS — BP 100/56 | HR 92 | Temp 97.4°F | Resp 20 | Ht 66.0 in | Wt 158.2 lb

## 2012-04-22 DIAGNOSIS — Z4651 Encounter for fitting and adjustment of gastric lap band: Secondary | ICD-10-CM

## 2012-04-22 DIAGNOSIS — R1310 Dysphagia, unspecified: Secondary | ICD-10-CM

## 2012-04-22 NOTE — Progress Notes (Signed)
Subjective:     Patient ID: Kristen [name], female   DOB: 1981/05/18, 31 y.o.   MRN: [id number]  HPI 31 year old Caucasian female comes in for long-term followup after undergoing laparoscopic adjustable gastric band placement in August 2012.  She had an AP standard band placed. She was last seen in the office in [place]. At that time she had a small amount removed due to solid food intolerance. Despite her band adjustment, she continues to have intolerance to solids and now liquids. She got admitted to [facility] because of a kidney stone since her last appt and reports issues with taking her pills during that admission. She is walking three times a week and working out about twice a week. 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 She is taking her daily multivitamin She denies any morning cough or reflux. She also complains of some hair thinning. She reports daily bowel movements.  Review of Systems 8 point ROS negative except for above  PMHx, PSHx, SOCHx, FAMHx, ALL reviewed and unchanged     Objective:   Physical Exam BP 100/56  Pulse 92  Temp 97.4 F (36.3 C) (Temporal)  Resp 20  Ht 5\' 6"  (1.676 m)  Wt 158 lb 3.2 oz (71.759 kg)  BMI 25.53 kg/m2  See LapBand flowsheet  Gen: alert, NAD, non-toxic appearing HEENT: normocephalic, atraumatic; pupils equal, no scleral icterus, neck supple, no lymphadenopathy Pulm: Lungs clear to auscultation, symmetric chest rise CV: regular rate and rhythm Abd: soft, nontender, nondistended. Well-healed trocar sites. No incisional hernia. Port is in right mid-abdomen Ext: no edema, normal, symmetric strength Neuro: nonfocal, sensation grossly intact Psych: appropriate, judgment normal     Assessment:     Status post laparoscopic adjustable gastric band placement Food intolerance    Plan:     Total weight loss to date - 126. 4 pounds!  However she is still having regurgitation to solid foods, I have recommended removing some  fluid from her band. She is very concerned about the amount that I plan to withdraw.    After obtaining verbal consent, the abdominal wall was prepped with Chloraprep. The port was accessed with a Huber needle and 0.5 cc of saline was removed to give the patient an expected fill volume of 5 cc.  The patient was able to tolerate sips of water.  She was encouraged to increase her protein intake because of hair thinning. She was also encouraged to continue with her daily exercising. I encouraged her to continue with her daily multivitamin. I advised her to take biotin for hair thinning. She was instructed to call the office if she has ongoing intolerance to solid foods. I explained the importance of [place] getting her tolerate solids - if not it could lead to pouch dilation or esophageal dilation. I also want to check an UGI.  Followup 3 months  Mary Sella. Andrey Campanile, MD, FACS General, Bariatric, & Minimally Invasive Surgery [facility], [place]

## 2012-04-22 NOTE — Patient Instructions (Signed)
1. Stay on liquids for the next 2 days as you adapt to your new fill volume.  Then resume your previous diet. 2. Decreasing your carbohydrate intake will hasten you weight loss.  Rely more on proteins for your meals.  Avoid condiments that contain sweets such as Honey Mustard and sugary salad dressings.   3. Stay in the "green zone".  If you are regurgitating with meals, having night time reflux, and find yourself eating soft comfort foods (mashed potatoes, potato chips)...realize that you are developing "maladaptive eating".  You will not lose weight this way and may regain weight.  The GREEN ZONE is eating smaller portions and not regurgitating.  Hence we may need to withdraw fluid from your band. 4. Build exercise into your daily routine.  Walking is the best way to start but do something every day if you can.   5. Call if you are still having food and liquid intolerance. Also call if you have no restriction

## 2012-04-23 ENCOUNTER — Other Ambulatory Visit (HOSPITAL_COMMUNITY)
Admission: RE | Admit: 2012-04-23 | Discharge: 2012-04-23 | Disposition: A | Discharge status: Home / Self Care | Payer: BC Managed Care – PPO | Source: Ambulatory Visit | Attending: Gynecology | Admitting: Gynecology

## 2012-04-23 ENCOUNTER — Ambulatory Visit (INDEPENDENT_AMBULATORY_CARE_PROVIDER_SITE_OTHER): Payer: BC Managed Care – PPO | Admitting: Gynecology

## 2012-04-23 ENCOUNTER — Encounter: Payer: Self-pay | Admitting: Gynecology

## 2012-04-23 VITALS — Ht 65.25 in | Wt 160.0 lb

## 2012-04-23 DIAGNOSIS — Z01419 Encounter for gynecological examination (general) (routine) without abnormal findings: Secondary | ICD-10-CM

## 2012-04-23 DIAGNOSIS — R350 Frequency of micturition: Secondary | ICD-10-CM

## 2012-04-23 DIAGNOSIS — Z131 Encounter for screening for diabetes mellitus: Secondary | ICD-10-CM

## 2012-04-23 DIAGNOSIS — Z13220 Encounter for screening for lipoid disorders: Secondary | ICD-10-CM

## 2012-04-23 DIAGNOSIS — Z1151 Encounter for screening for human papillomavirus (HPV): Secondary | ICD-10-CM | POA: Insufficient documentation

## 2012-04-23 DIAGNOSIS — T839XXA Unspecified complication of genitourinary prosthetic device, implant and graft, initial encounter: Secondary | ICD-10-CM

## 2012-04-23 LAB — COMPREHENSIVE METABOLIC PANEL
Albumin: 4.4 g/dL (ref 3.5–5.2)
BUN: 10 mg/dL (ref 6–23)
CO2: 29 mEq/L (ref 19–32)
Calcium: 9.9 mg/dL (ref 8.4–10.5)
Glucose, Bld: 82 mg/dL (ref 70–99)
Potassium: 4.4 mEq/L (ref 3.5–5.3)
Sodium: 140 mEq/L (ref 135–145)
Total Protein: 6.8 g/dL (ref 6.0–8.3)

## 2012-04-23 LAB — CBC WITH DIFFERENTIAL/PLATELET
Eosinophils Absolute: 0.2 10*3/uL (ref 0.0–0.7)
HCT: 41.2 % (ref 36.0–46.0)
Hemoglobin: 14.2 g/dL (ref 12.0–15.0)
Lymphs Abs: 2.5 10*3/uL (ref 0.7–4.0)
MCH: 32 pg (ref 26.0–34.0)
Monocytes Absolute: 0.5 10*3/uL (ref 0.1–1.0)
Monocytes Relative: 8 % (ref 3–12)
Neutro Abs: 3.6 10*3/uL (ref 1.7–7.7)
Neutrophils Relative %: 53 % (ref 43–77)
RBC: 4.44 MIL/uL (ref 3.87–5.11)

## 2012-04-23 LAB — URINALYSIS W MICROSCOPIC + REFLEX CULTURE
Bilirubin Urine: NEGATIVE
Leukocytes, UA: NEGATIVE
Protein, ur: NEGATIVE mg/dL
Urobilinogen, UA: >8 mg/dL — ABNORMAL HIGH (ref 0.0–1.0)

## 2012-04-23 LAB — LIPID PANEL
Cholesterol: 174 mg/dL (ref 0–200)
HDL: 45 mg/dL (ref >39)
Triglycerides: 82 mg/dL (ref <150)

## 2012-04-23 NOTE — Patient Instructions (Signed)
Follow up for ultrasound as scheduled 

## 2012-04-23 NOTE — Addendum Note (Signed)
Addended by: Richardson Chiquito on: 04/23/2012 02:54 PM   Modules accepted: Orders

## 2012-04-23 NOTE — Progress Notes (Signed)
Kristen [name] 22-Feb-1981 [id number]        31 y.o.  G0P0 new patient for annual exam.    Past medical history,surgical history, medications, allergies, family history and social history were all reviewed and documented in the EPIC chart. ROS:  Was performed and pertinent positives and negatives are included in the history.  Exam: Sherrilyn Rist assistant Filed Vitals:   04/23/12 1401  Height: 5' 5.25" (1.657 m)  Weight: 160 lb (72.576 kg)   General appearance  Normal Skin grossly normal Head/Neck normal with no cervical or supraclavicular adenopathy thyroid normal Lungs  clear Cardiac RR, without RMG Abdominal  soft, nontender, without masses, organomegaly or hernia. LAP-BAND port site right upper quadrant. Breasts  examined lying and sitting without masses, retractions, discharge or axillary adenopathy. Pelvic  Ext/BUS/vagina  normal mild vaginismus with speculum and digital exam  Cervix  normal IUD string not visualized grossly or with colposcopy and endocervical speculum.  Uterus  anteverted, normal size, shape and contour, midline and mobile nontender   Adnexa  Without masses or tenderness    Anus and perineum  normal   Rectovaginal  normal sphincter tone without palpated masses or tenderness.    Assessment/Plan:  31 y.o. G0P0 female for annual exam.   1. Mirena IUD June 2010. Light every other month menses. IUD string not visualized. Check ultrasound now for placement. 2. Mild vaginismus. Patient knows it is uncomfortable with intercourse and exams. No history of abuse. Various strategies reviewed to include dilators. Patient will try. 3. Pap smear 2011. Pap/HPV done today. No history of abnormal Pap smears previously. Plan every 5 year screening if normal. 4. Mild frequency x2 days. Urinalysis is normal. Recommend pushing fluids. If symptoms persist she'll call me may consider short course of antibiotic. If they clear then we'll follow. 5. Breast health. SBE monthly  reviewed. 6. Health maintenance. CBC comprehensive metabolic panel lipid profile ordered.  Follow up for ultrasound otherwise annually.    Dara Lords MD, 2:37 PM 04/23/2012

## 2012-05-11 ENCOUNTER — Ambulatory Visit
Admission: RE | Admit: 2012-05-11 | Discharge: 2012-05-11 | Disposition: A | Discharge status: Home / Self Care | Payer: BC Managed Care – PPO | Source: Ambulatory Visit | Attending: General Surgery | Admitting: General Surgery

## 2012-05-11 DIAGNOSIS — R1310 Dysphagia, unspecified: Secondary | ICD-10-CM

## 2012-05-15 ENCOUNTER — Ambulatory Visit (INDEPENDENT_AMBULATORY_CARE_PROVIDER_SITE_OTHER): Payer: BC Managed Care – PPO

## 2012-05-15 ENCOUNTER — Ambulatory Visit (INDEPENDENT_AMBULATORY_CARE_PROVIDER_SITE_OTHER): Payer: BC Managed Care – PPO | Admitting: Gynecology

## 2012-05-15 ENCOUNTER — Encounter: Payer: Self-pay | Admitting: Gynecology

## 2012-05-15 DIAGNOSIS — N83209 Unspecified ovarian cyst, unspecified side: Secondary | ICD-10-CM

## 2012-05-15 DIAGNOSIS — Z30431 Encounter for routine checking of intrauterine contraceptive device: Secondary | ICD-10-CM

## 2012-05-15 DIAGNOSIS — IMO0002 Reserved for concepts with insufficient information to code with codable children: Secondary | ICD-10-CM

## 2012-05-15 DIAGNOSIS — T839XXA Unspecified complication of genitourinary prosthetic device, implant and graft, initial encounter: Secondary | ICD-10-CM

## 2012-05-15 NOTE — Progress Notes (Signed)
Patient follows up for ultrasound for IUD placement. String was not visualized her recent annual exam.  Ultrasound shows uterus normal size and echotexture. Endometrial echo 3.8 mm. IUD within the endometrial cavity in the proper location. Right ovaries normal left ovary with echo-free thin-walled vascular cyst with 2 septa overall size 54 x 37 x 43 mm. Possibly smaller cysts side-by-side. Cul-de-sac negative.  Assessment and plan: 1. Ultrasound for IUD placement. IUD in proper location with string within the lower uterine segment. Continue to follow. 2. Left ovarian cyst simple/vascular. Several septum versus several cysts side-by-side. Patient is asymptomatic. Review possibilities to include physiologic versus pathologic. Plan repeat ultrasound in 2-3 months. Various scenarios to include resolution, persistence or enlargement discussed with possible surgery. Patient's comfortable with observation and we ultrasound in 2-3 months.

## 2012-05-15 NOTE — Patient Instructions (Signed)
Follow up for ultrasound in 2-3 months 

## 2012-06-03 ENCOUNTER — Ambulatory Visit (INDEPENDENT_AMBULATORY_CARE_PROVIDER_SITE_OTHER): Payer: BC Managed Care – PPO | Admitting: General Surgery

## 2012-06-03 ENCOUNTER — Encounter (INDEPENDENT_AMBULATORY_CARE_PROVIDER_SITE_OTHER): Payer: Self-pay | Admitting: General Surgery

## 2012-06-03 VITALS — BP 108/62 | HR 72 | Resp 16 | Ht 66.0 in | Wt 159.0 lb

## 2012-06-03 DIAGNOSIS — Z4651 Encounter for fitting and adjustment of gastric lap band: Secondary | ICD-10-CM

## 2012-06-03 NOTE — Progress Notes (Signed)
Subjective:     Patient ID: Kristen [name], female   DOB: 1981/04/14, 32 y.o.   MRN: [id number]  HPI  32 year old Caucasian female comes in for followup after undergoing laparoscopic adjustable gastric band placement in August 2012. I last saw her on December 4. At that time she was having some continued food intolerance. We removed half a cc of fluid from her band.  She states that since that time she is unable to tolerate more solids however she still has an intolerance to rice, steak and fish. She states that she still realizes how important exercise and proper food choices are to maintaining her weight. She denies any reflux or indigestion. She denies any morning cough. She denies any abdominal pain. She states that her job is going well Review of Systems     Objective:   Physical Exam BP 108/62  Pulse 72  Resp 16  Ht 5\' 6"  (1.676 m)  Wt 159 lb (72.122 kg)  BMI 25.66 kg/m2  LMP 04/13/2012  See LapBand flowsheet  Gen: alert, NAD, non-toxic appearing HEENT: normocephalic, atraumatic; pupils equal, no scleral icterus, neck supple, no lymphadenopathy Pulm: Lungs clear to auscultation, symmetric chest rise CV: regular rate and rhythm Abd: soft, nontender, nondistended. Well-healed trocar sites. No incisional hernia. Port is in right mid-abdomen Ext: no edema, normal, symmetric strength Neuro: nonfocal, sensation grossly intact Psych: appropriate, judgment normal     Assessment:     Status post laparoscopic adjustable gastric band placement    Plan:     She has essentially maintained her weight since her last visit. She gained 0.8 pounds. We reviewed her upper GI that we got a few weeks ago. Her upper GI is normal. There is no signs of pouch dilatation, esophageal dilatation or band slippage. However she is still having more intolerance to solid foods than I would like. It is expected for some patients have intolerance to certain foods however she states that she has to eat solids  while standing up. I recommended removing some additional fluid from her band.  After obtaining verbal consent, the abdominal wall was prepped with Chloraprep. The port was accessed with a Huber needle and 0.25cc of saline was removedto give the patient an expected fill volume of 4.75 cc.  The patient was able to tolerate sips of water.She was reminded to stay on a liquid diet for the next 24 hours.  We also rediscussed the importance of daily exercise as well as proper food choices in order to maintain her weight. Her total weight loss to date has been approximately 125.6 pounds since surgery. F/u 3 months.   Mary Sella. Andrey Campanile, MD, FACS General, Bariatric, & Minimally Invasive Surgery [facility], [place]

## 2012-06-03 NOTE — Patient Instructions (Signed)
1. Stay on liquids for the next 1- 2 days as you adapt to your new fill volume.  Then resume your previous diet. 2. Decreasing your carbohydrate intake will hasten you weight loss.  Rely more on proteins for your meals.  Avoid condiments that contain sweets such as Honey Mustard and sugary salad dressings.   3. Stay in the "green zone".  If you are regurgitating with meals, having night time reflux, and find yourself eating soft comfort foods (mashed potatoes, potato chips)...realize that you are developing "maladaptive eating".  You will not lose weight this way and may regain weight.  The GREEN ZONE is eating smaller portions and not regurgitating.  Hence we may need to withdraw fluid from your band. 4. Build exercise into your daily routine.  Walking is the best way to start but do something every day if you can.    

## 2012-07-04 ENCOUNTER — Other Ambulatory Visit: Payer: Self-pay

## 2012-07-22 ENCOUNTER — Ambulatory Visit: Payer: BC Managed Care – PPO | Admitting: Gynecology

## 2012-07-22 ENCOUNTER — Other Ambulatory Visit: Payer: BC Managed Care – PPO

## 2012-08-31 ENCOUNTER — Ambulatory Visit (INDEPENDENT_AMBULATORY_CARE_PROVIDER_SITE_OTHER): Payer: BC Managed Care – PPO | Admitting: Gynecology

## 2012-08-31 ENCOUNTER — Ambulatory Visit (INDEPENDENT_AMBULATORY_CARE_PROVIDER_SITE_OTHER): Payer: BC Managed Care – PPO

## 2012-08-31 ENCOUNTER — Encounter: Payer: Self-pay | Admitting: Gynecology

## 2012-08-31 DIAGNOSIS — Z30431 Encounter for routine checking of intrauterine contraceptive device: Secondary | ICD-10-CM

## 2012-08-31 DIAGNOSIS — R188 Other ascites: Secondary | ICD-10-CM

## 2012-08-31 DIAGNOSIS — N8300 Follicular cyst of ovary, unspecified side: Secondary | ICD-10-CM

## 2012-08-31 DIAGNOSIS — N83209 Unspecified ovarian cyst, unspecified side: Secondary | ICD-10-CM

## 2012-08-31 NOTE — Progress Notes (Signed)
Patient presents for follow up ultrasound.  Had ultrasound in December for IUD location which showed normal location but had a 54 mm echo-free thin-walled vascular cyst on her left ovary. Was asked to come back for followup to make sure this resolves.  Ultrasound shows normal uterus with normal echotexture. Endometrial echo 4.5 mm. IUD in normal location. Right ovary and left ovary are normal with physiologic changes. Prior cystic area gone.   Assessment and plan: Prior left ovarian cyst now resolved. She did have an episode of pain on the left which is now gone. Patient will keep a calendar as long as doing well and will follow up in December for her annual exam. If any issues sooner than will represent at that time.

## 2012-08-31 NOTE — Patient Instructions (Signed)
Follow up in December for annual exam, sooner as needed

## 2012-09-07 ENCOUNTER — Encounter (INDEPENDENT_AMBULATORY_CARE_PROVIDER_SITE_OTHER): Payer: Self-pay | Admitting: General Surgery

## 2012-09-07 ENCOUNTER — Ambulatory Visit (INDEPENDENT_AMBULATORY_CARE_PROVIDER_SITE_OTHER): Payer: BC Managed Care – PPO | Admitting: General Surgery

## 2012-09-07 VITALS — BP 102/60 | HR 72 | Temp 97.2°F | Resp 16 | Ht 66.0 in | Wt 155.8 lb

## 2012-09-07 DIAGNOSIS — Z4651 Encounter for fitting and adjustment of gastric lap band: Secondary | ICD-10-CM

## 2012-09-07 MED ORDER — LIDOCAINE-PRILOCAINE 2.5-2.5 % EX KIT: PACK | Freq: Once | CUTANEOUS | Status: DC

## 2012-09-07 NOTE — Progress Notes (Signed)
Subjective:     Patient ID: Kristen [name], female   DOB: [birth year], 32 y.o.   MRN: [id number]  HPI 32 year old Caucasian female comes in for long-term followup. She underwent placement of an AP standard laparoscopic adjustable gastric band in August 2012. She was last seen in the office in January 2014. At that time she was still having episodes of intolerance to both solids and some liquids. We removed some fluid from her band at that time. She states that she is still having ongoing intolerance to solids and liquids especially she's been down to 90. Sometimes she has no problems and sometimes she does. She states that she is chewing very thoroughly and waiting 30-40 seconds between taking the next bite. She states occasionally the feel like the liquid to service it's in her upper chest. She has had some intermittent regurgitation. She is starting to exercise a little bit more now that the weather has gotten better.  Review of Systems     Objective:   Physical Exam BP 102/60  Pulse 72  Temp(Src) 97.2 F (36.2 C) (Temporal)  Resp 16  Ht 5\' 6"  (1.676 m)  Wt 155 lb 12.8 oz (70.67 kg)  BMI 25.16 kg/m2  See LapBand flowsheet  Gen: alert, NAD, non-toxic appearing HEENT: normocephalic, atraumatic; pupils equal, no scleral icterus, neck supple, no lymphadenopathy Pulm: Lungs clear to auscultation, symmetric chest rise CV: regular rate and rhythm Abd: soft, nontender, nondistended. Well-healed trocar sites. No incisional hernia. Port is in right mid-abdomen Ext: no edema, normal, symmetric strength Neuro: nonfocal, sensation grossly intact Psych: appropriate, judgment normal     Assessment:     S/p LAGB     Plan:     It appears her LapBand is still too tight. I recommended removing some additional fluid today.  After obtaining verbal consent, the abdominal wall was prepped with Chloraprep. The port was accessed with a Huber needle and 0.5 cc of saline was removed to give the patient  an expected fill volume of 4.25 cc.  The patient was able to tolerate sips of water.  She was instructed to stay on liquids for the next 24-48 hours.  She has maintained her weight since her last visit. Her weight today is 155.8 pounds. Her weight at her last appointment in January 2014 was 159 pounds. Her preoperative weight was 285 pounds.  We discussed the importance of incorporating exercise on a daily basis. I encouraged her to get a pedometer and track her daily step count for a few days and then sent weekly goals of increasing her step count as a way of incorporating exercise.  I also encouraged her to get an over-the-counter antireflux medication. I told her to contact the office if she would prefer a prescription.  She is also given a prescription for lidocaine ointment to apply to her skin prior to her future appointment.   F/u 6 weeks  Mary Sella. Andrey Campanile, MD, FACS General, Bariatric, & Minimally Invasive Surgery [facility], [place]

## 2012-09-07 NOTE — Patient Instructions (Addendum)
1. Stay on liquids for the next 1- 2 days as you adapt to your new fill volume.  Then resume your previous diet. 2. Decreasing your carbohydrate intake will hasten you weight loss.  Rely more on proteins for your meals.  Avoid condiments that contain sweets such as Honey Mustard and sugary salad dressings.   3. Stay in the "green zone".  If you are regurgitating with meals, having night time reflux, and find yourself eating soft comfort foods (mashed potatoes, potato chips)...realize that you are developing "maladaptive eating".  You will not lose weight this way and may regain weight.  The GREEN ZONE is eating smaller portions and not regurgitating.  Hence we may need to withdraw fluid from your band. 4. Build exercise into your daily routine.  Walking is the best way to start but do something every day if you can.  5. Consider getting a pedometer and tracking your daily step count for a few days and then setting weekly goals to increase your daily step count 6 Get an anti-reflux medication and start taking on a daily basis - i.e. Prilosec. Call if you would prefer a prescription 7. Apply the skin numbing ointment about 20-30 minutes prior to appt - Rx sent electronically to pharmacy

## 2012-09-09 ENCOUNTER — Encounter (INDEPENDENT_AMBULATORY_CARE_PROVIDER_SITE_OTHER): Payer: BC Managed Care – PPO | Admitting: General Surgery

## 2012-09-22 ENCOUNTER — Ambulatory Visit (INDEPENDENT_AMBULATORY_CARE_PROVIDER_SITE_OTHER): Payer: BC Managed Care – PPO | Admitting: General Surgery

## 2012-09-22 ENCOUNTER — Telehealth (INDEPENDENT_AMBULATORY_CARE_PROVIDER_SITE_OTHER): Payer: Self-pay

## 2012-09-22 ENCOUNTER — Encounter (INDEPENDENT_AMBULATORY_CARE_PROVIDER_SITE_OTHER): Payer: Self-pay | Admitting: General Surgery

## 2012-09-22 VITALS — BP 100/62 | HR 70 | Resp 18 | Ht 66.0 in | Wt 155.6 lb

## 2012-09-22 DIAGNOSIS — Z4651 Encounter for fitting and adjustment of gastric lap band: Secondary | ICD-10-CM

## 2012-09-22 NOTE — Progress Notes (Signed)
Subjective:     Patient ID: Kristen [name], female   DOB: 29-Nov-1980, 32 y.o.   MRN: [id number]  HPI 32 year old Caucasian female comes in for short interval followup. I last saw her on April 21. At that time we removed half a cc of fluid from her lap band because of ongoing intermittent episodes of food intolerance. She states since that time she has gotten significant improvement. However she is still having problems with intermittent episodes of intolerance to solids primarily when sitting down. If she is eating and standing she doesn't really have any problems. She believes that were almost at the correct amount of fluid in her band. She is increasing her exercise. She is now doing jumping jacks and crunches. She was able to order a pedometer through her [employer]. She is having a bowel movement every other day. She is taking her multivitamins.  Review of Systems     Objective:   Physical Exam BP 100/62  Pulse 70  Resp 18  Ht 5\' 6"  (1.676 m)  Wt 155 lb 9.6 oz (70.58 kg)  BMI 25.13 kg/m2  See LapBand flowsheet  Gen: alert, NAD, non-toxic appearing HEENT: normocephalic, atraumatic; pupils equal, no scleral icterus,  Abd: soft, nontender, nondistended. Well-healed trocar sites. No incisional hernia. Port is in right mid-abdomen Psych: appropriate, judgment normal     Assessment:     S/p LAGB     Plan:     We are almost there.   After obtaining verbal consent, the abdominal wall was prepped with Chloraprep. The port was accessed with a Huber needle and 0.25 cc of saline was removed to give the patient an expected fill volume of 4 cc.  The patient was able to tolerate sips of water.  She was instructed to stay on liquids for the next 24 hours. She was instructed to call if she had ongoing intolerance to solids.  She was instructed to keep up with the exercises. She is maintaining her weight loss. She has lost around 130 pounds. Followup 6-8 weeks  Mary Sella.  Andrey Campanile, MD, FACS General, Bariatric, & Minimally Invasive Surgery [facility], [place]

## 2012-09-22 NOTE — Telephone Encounter (Signed)
LMOM giving pt her next appt with Dr Andrey Campanile for 11/11/12 arrive at 11:30 for 11:45.

## 2012-09-22 NOTE — Patient Instructions (Signed)

## 2012-10-22 ENCOUNTER — Encounter (INDEPENDENT_AMBULATORY_CARE_PROVIDER_SITE_OTHER): Payer: BC Managed Care – PPO | Admitting: General Surgery

## 2012-11-11 ENCOUNTER — Encounter (INDEPENDENT_AMBULATORY_CARE_PROVIDER_SITE_OTHER): Payer: Self-pay | Admitting: General Surgery

## 2012-11-11 ENCOUNTER — Ambulatory Visit (INDEPENDENT_AMBULATORY_CARE_PROVIDER_SITE_OTHER): Payer: BC Managed Care – PPO | Admitting: General Surgery

## 2012-11-11 VITALS — BP 128/72 | HR 70 | Temp 97.2°F | Resp 16 | Ht 66.0 in | Wt 149.0 lb

## 2012-11-11 DIAGNOSIS — Z4651 Encounter for fitting and adjustment of gastric lap band: Secondary | ICD-10-CM

## 2012-11-11 NOTE — Progress Notes (Signed)
Subjective:     Patient ID: Kristen [name], female   DOB: 06/22/1980, 32 y.o.   MRN: [id number]  HPI 32 year old Caucasian female comes in for short interval followup. I last saw her on May 6. At that time we removed 0.25 cc of fluid from her lap band because of ongoing intermittent episodes of food intolerance. She states since that time she has gotten significant improvement. However she is still having problems with intermittent episodes of intolerance to solids primarily when sitting down. If she is eating and standing she doesn't really have any problems. She believes that were almost at the correct amount of fluid in her band. She is increasing her exercise. She is now doing jumping jacks and crunches. She denies reflux, regurgitation, or night time cough. She is having a bowel movement every other day. She is taking her multivitamins.  Review of Systems     Objective:   Physical Exam BP 128/72  Pulse 70  Temp(Src) 97.2 F (36.2 C)  Resp 16  Ht 5\' 6"  (1.676 m)  Wt 149 lb (67.586 kg)  BMI 24.06 kg/m2  See LapBand flowsheet  Gen: alert, NAD, non-toxic appearing HEENT: normocephalic, atraumatic; pupils equal, no scleral icterus,  Abd: soft, nontender, nondistended. Well-healed trocar sites. No incisional hernia. Port is in right mid-abdomen Psych: appropriate, judgment normal     Assessment:     S/p LAGB     Plan:     We are almost there. She has lost an additional 6 pounds since her last visit.   After obtaining verbal consent, the abdominal wall was prepped with Chloraprep. The port was accessed with a Huber needle and 0.2 cc of saline was removed to give the patient an expected fill volume of 3.8 cc.  The patient was able to tolerate sips of water.  She was instructed to stay on liquids for the next 24 hours. She was instructed to call if she had ongoing intolerance to solids.  She was instructed to keep up with the exercises. She is maintaining her weight loss. She has  lost around 136 pounds. Followup 12 weeks  Mary Sella. Andrey Campanile, MD, FACS General, Bariatric, & Minimally Invasive Surgery [facility], [place]

## 2012-11-11 NOTE — Patient Instructions (Signed)
1. Stay on liquids for the next 1- 2 days as you adapt to your new fill volume.  Then resume your previous diet. 2. Decreasing your carbohydrate intake will hasten you weight loss.  Rely more on proteins for your meals.  Avoid condiments that contain sweets such as Honey Mustard and sugary salad dressings.   3. Stay in the "green zone".  If you are regurgitating with meals, having night time reflux, and find yourself eating soft comfort foods (mashed potatoes, potato chips)...realize that you are developing "maladaptive eating".  You will not lose weight this way and may regain weight.  The GREEN ZONE is eating smaller portions and not regurgitating.  Hence we may need to withdraw fluid from your band. 4. Build exercise into your daily routine.  Walking is the best way to start but do something every day if you can.    

## 2012-12-18 ENCOUNTER — Encounter (INDEPENDENT_AMBULATORY_CARE_PROVIDER_SITE_OTHER): Payer: Self-pay | Admitting: General Surgery

## 2012-12-18 ENCOUNTER — Ambulatory Visit (INDEPENDENT_AMBULATORY_CARE_PROVIDER_SITE_OTHER): Payer: BC Managed Care – PPO | Admitting: General Surgery

## 2012-12-18 ENCOUNTER — Telehealth (INDEPENDENT_AMBULATORY_CARE_PROVIDER_SITE_OTHER): Payer: Self-pay

## 2012-12-18 VITALS — BP 108/64 | HR 64 | Temp 97.6°F | Resp 14 | Ht 66.0 in | Wt 142.4 lb

## 2012-12-18 DIAGNOSIS — Z8639 Personal history of other endocrine, nutritional and metabolic disease: Secondary | ICD-10-CM

## 2012-12-18 DIAGNOSIS — Z87898 Personal history of other specified conditions: Secondary | ICD-10-CM

## 2012-12-18 DIAGNOSIS — Z4651 Encounter for fitting and adjustment of gastric lap band: Secondary | ICD-10-CM

## 2012-12-18 MED ORDER — ESOMEPRAZOLE MAGNESIUM 40 MG PO PACK: 40.0000 mg | PACK | Freq: Every day | ORAL | Status: DC

## 2012-12-18 NOTE — Patient Instructions (Signed)

## 2012-12-18 NOTE — Progress Notes (Signed)
Patient was seen earlier today in the clinic in 0.5 cc of saline was removed from her laparoscopic adjustable gastric band. She  Called back later this afternoon complaining of the same symptoms she had prior to appointment specifically of difficulty with solids and liquids while sitting down despite being on liquids Since her appointment this morning. She was advised to come back to the office.  Her symptoms are unchanged from her appointment earlier today.  After obtaining verbal consent, the abdominal wall was prepped with Chloraprep. The port was accessed with a Huber needle and 1.2 cc of saline was removed to give the patient an expected fill volume of 1.4 cc.  The patient was able to tolerate sips of water.  She was instructed To stay on liquids for the next 48 hours. Followup 4-6 weeks. Continue Nexium. Call with any additional problems or concerns   Mary Sella. Andrey Campanile, MD, FACS General, Bariatric, & Minimally Invasive Surgery [facility], [place]

## 2012-12-18 NOTE — Telephone Encounter (Signed)
Patient notified to come over to office now.

## 2012-12-18 NOTE — Telephone Encounter (Signed)
The pt called in unable to swallow pills and liquids.  She wants to be seen again today.

## 2012-12-18 NOTE — Progress Notes (Signed)
Subjective:     Patient ID: Kristen [name], female   DOB: [birth year], 32 y.o.   MRN: [id number]  HPI 32 year old Caucasian female comes in for short interval followup. I last saw her on June 25th. At that time we removed 0.2 cc of fluid from her lap band because of ongoing intermittent episodes of food intolerance. She states since that time she has not had significant improvement. She is still having problems with intermittent episodes of intolerance to solids primarily when sitting down. If she is eating and standing she doesn't really have any problems. She believes that were almost at the correct amount of fluid in her band. She is increasing her exercise.  She complains of reflux but denies regurgitation, or night time cough. She is having a bowel movement every other day. She is taking her multivitamins.  Review of Systems     Objective:   Physical Exam BP 108/64  Pulse 64  Temp(Src) 97.6 F (36.4 C) (Temporal)  Resp 14  Ht 5\' 6"  (1.676 m)  Wt 142 lb 6.4 oz (64.592 kg)  BMI 22.99 kg/m2  See LapBand flowsheet  Gen: alert, NAD, non-toxic appearing HEENT: normocephalic, atraumatic; pupils equal, no scleral icterus,  Abd: soft, nontender, nondistended. Well-healed trocar sites. No incisional hernia. Port is in right mid-abdomen Psych: appropriate, judgment normal     Assessment:     S/p LAGB     Plan:     She has lost an additional 6.6 pounds since her last visit. Total weight loss 142.6 pounds. Last UGI 12/13 - normal  Because of on-going po intolerance I recommended an adjustment. After obtaining verbal consent, the abdominal wall was prepped with Chloraprep. The port was accessed with a Huber needle and 0.5 cc of saline was removed to give the patient an expected fill volume of 3.3 cc.  The patient was able to tolerate sips of water.  She was instructed to stay on liquids for the next 24 hours. She was instructed to call if she had ongoing intolerance to solids.  She was  instructed to keep up with the exercises.. Followup 8 weeks or sooner if symptoms persist.   Mary Sella. Andrey Campanile, MD, FACS General, Bariatric, & Minimally Invasive Surgery [facility], [place]

## 2012-12-18 NOTE — Patient Instructions (Signed)
1. Stay on liquids for the next 2 days as you adapt to your new fill volume.  Then resume your previous diet. 2. Decreasing your carbohydrate intake will hasten you weight loss.  Rely more on proteins for your meals.  Avoid condiments that contain sweets such as Honey Mustard and sugary salad dressings.   3. Stay in the "green zone".  If you are regurgitating with meals, having night time reflux, and find yourself eating soft comfort foods (mashed potatoes, potato chips)...realize that you are developing "maladaptive eating".  You will not lose weight this way and may regain weight.  The GREEN ZONE is eating smaller portions and not regurgitating.  Hence we may need to withdraw fluid from your band. 4. Build exercise into your daily routine.  Walking is the best way to start but do something every day if you can.    Eating techniques 20-20-20 (30-30-30) 20 chews, 20 seconds between bites of food, 20 minutes to eat; sometimes you may need 30 chews, 30 seconds etc Use your nondominant hand to eat with Use a child/infant size utensil Try not to eat while watching TV  If still having problems let me know

## 2012-12-22 ENCOUNTER — Telehealth (INDEPENDENT_AMBULATORY_CARE_PROVIDER_SITE_OTHER): Payer: Self-pay | Admitting: *Deleted

## 2012-12-22 NOTE — Telephone Encounter (Signed)
Message left on patients voicemail with appt time for this Friday.  Encouraged patient to give [place] a call back if her symptoms become worse between now and then.

## 2012-12-22 NOTE — Telephone Encounter (Signed)
Friday AM 8:45 AM - be at clinic at 0830 for check in. Advise pt that if gets worse needs to go to [facility] ED or come in to urgent office or my clinic in AM

## 2012-12-22 NOTE — Telephone Encounter (Signed)
Patient called in to report that she is still having difficulty keeping down liquids and asking for an appt on Friday.  Patient states she is unable to come in any other day.  Explained to patient that at this time Dr. Andrey Campanile doesn't have any appts open on Friday but this RN will send him a message to see if he can fit her into the Friday schedule.  Explained we will give her a call back as soon as we know.  Patient states understanding and agreeable at this time.

## 2012-12-23 ENCOUNTER — Encounter (INDEPENDENT_AMBULATORY_CARE_PROVIDER_SITE_OTHER): Payer: Self-pay | Admitting: General Surgery

## 2012-12-25 ENCOUNTER — Ambulatory Visit
Admission: RE | Admit: 2012-12-25 | Discharge: 2012-12-25 | Disposition: A | Discharge status: Home / Self Care | Payer: BC Managed Care – PPO | Source: Ambulatory Visit | Attending: General Surgery | Admitting: General Surgery

## 2012-12-25 ENCOUNTER — Encounter (HOSPITAL_COMMUNITY): Payer: Self-pay | Admitting: *Deleted

## 2012-12-25 ENCOUNTER — Ambulatory Visit (INDEPENDENT_AMBULATORY_CARE_PROVIDER_SITE_OTHER): Payer: BC Managed Care – PPO | Admitting: General Surgery

## 2012-12-25 ENCOUNTER — Inpatient Hospital Stay (HOSPITAL_COMMUNITY)
Admission: AD | Admit: 2012-12-25 | Discharge: 2012-12-27 | DRG: 477 | Disposition: A | Discharge status: Home / Self Care | Payer: BC Managed Care – PPO | Source: Ambulatory Visit | Attending: General Surgery | Admitting: General Surgery

## 2012-12-25 VITALS — BP 122/76 | HR 105 | Temp 99.0°F | Resp 18 | Ht 66.0 in | Wt 144.8 lb

## 2012-12-25 DIAGNOSIS — Y838 Other surgical procedures as the cause of abnormal reaction of the patient, or of later complication, without mention of misadventure at the time of the procedure: Secondary | ICD-10-CM | POA: Diagnosis present

## 2012-12-25 DIAGNOSIS — IMO0001 Reserved for inherently not codable concepts without codable children: Secondary | ICD-10-CM

## 2012-12-25 DIAGNOSIS — R6883 Chills (without fever): Secondary | ICD-10-CM

## 2012-12-25 DIAGNOSIS — F411 Generalized anxiety disorder: Secondary | ICD-10-CM | POA: Diagnosis present

## 2012-12-25 DIAGNOSIS — D72829 Elevated white blood cell count, unspecified: Secondary | ICD-10-CM | POA: Diagnosis present

## 2012-12-25 DIAGNOSIS — Z9884 Bariatric surgery status: Secondary | ICD-10-CM

## 2012-12-25 DIAGNOSIS — K7689 Other specified diseases of liver: Secondary | ICD-10-CM | POA: Diagnosis present

## 2012-12-25 DIAGNOSIS — R1110 Vomiting, unspecified: Secondary | ICD-10-CM

## 2012-12-25 DIAGNOSIS — K9509 Other complications of gastric band procedure: Principal | ICD-10-CM | POA: Diagnosis present

## 2012-12-25 DIAGNOSIS — N760 Acute vaginitis: Secondary | ICD-10-CM | POA: Diagnosis present

## 2012-12-25 DIAGNOSIS — N200 Calculus of kidney: Secondary | ICD-10-CM | POA: Diagnosis present

## 2012-12-25 LAB — COMPREHENSIVE METABOLIC PANEL
ALT: 33 U/L (ref 0–35)
BUN: 8 mg/dL (ref 6–23)
CO2: 28 mEq/L (ref 19–32)
Calcium: 10.1 mg/dL (ref 8.4–10.5)
Chloride: 105 mEq/L (ref 96–112)
Creat: 0.5 mg/dL (ref 0.50–1.10)
Glucose, Bld: 76 mg/dL (ref 70–99)

## 2012-12-25 LAB — CBC WITH DIFFERENTIAL/PLATELET
HCT: 39.8 % (ref 36.0–46.0)
Hemoglobin: 13.4 g/dL (ref 12.0–15.0)
Lymphocytes Relative: 15 % (ref 12–46)
Lymphs Abs: 2.1 10*3/uL (ref 0.7–4.0)
Monocytes Absolute: 0.6 10*3/uL (ref 0.1–1.0)
Neutro Abs: 11.6 10*3/uL — ABNORMAL HIGH (ref 1.7–7.7)
RBC: 4.19 MIL/uL (ref 3.87–5.11)
RDW: 12 % (ref 11.5–15.5)
WBC: 14.4 10*3/uL — ABNORMAL HIGH (ref 4.0–10.5)

## 2012-12-25 MED ORDER — KCL IN DEXTROSE-NACL 20-5-0.45 MEQ/L-%-% IV SOLN
INTRAVENOUS | Status: DC
  Administered 2012-12-25 – 2012-12-26 (×2): via INTRAVENOUS
  Filled 2012-12-25 (×4): qty 1000

## 2012-12-25 MED ORDER — HEPARIN SODIUM (PORCINE) 5000 UNIT/ML IJ SOLN
5000.0000 [IU] | Freq: Once | INTRAMUSCULAR | Status: AC
  Administered 2012-12-25: 5000 [IU] via SUBCUTANEOUS
  Filled 2012-12-25: qty 1

## 2012-12-25 MED ORDER — PROMETHAZINE HCL 25 MG/ML IJ SOLN: 12.5000 mg | Freq: Four times a day (QID) | INTRAMUSCULAR | Status: DC | PRN

## 2012-12-25 MED ORDER — ONDANSETRON HCL 4 MG/2ML IJ SOLN: 4.0000 mg | Freq: Four times a day (QID) | INTRAMUSCULAR | Status: DC | PRN

## 2012-12-25 MED ORDER — LACTATED RINGERS IV BOLUS (SEPSIS)
1000.0000 mL | Freq: Once | INTRAVENOUS | Status: AC
  Administered 2012-12-25: 1000 mL via INTRAVENOUS

## 2012-12-25 MED ORDER — PANTOPRAZOLE SODIUM 40 MG IV SOLR
40.0000 mg | Freq: Every day | INTRAVENOUS | Status: DC
  Administered 2012-12-25 – 2012-12-26 (×2): 40 mg via INTRAVENOUS
  Filled 2012-12-25 (×3): qty 40

## 2012-12-25 MED ORDER — MORPHINE SULFATE 2 MG/ML IJ SOLN
1.0000 mg | INTRAMUSCULAR | Status: DC | PRN
  Administered 2012-12-25 – 2012-12-26 (×3): 2 mg via INTRAVENOUS
  Filled 2012-12-25 (×3): qty 1

## 2012-12-25 NOTE — Addendum Note (Signed)
Addended by: Andrey Campanile, Alexee Delsanto M on: 12/25/2012 01:22 PM   Modules accepted: Orders

## 2012-12-25 NOTE — H&P (Signed)
Kristen [name] is an 32 y.o. female.      THIS NOTE WAS COMPLETED BASED ON MY EXAM AND HISTORY OF THE PT PERFORMED THIS AM IN THE OFFICE.   Chief Complaint: abd pain HPI: 32 year old Caucasian female status post laparoscopic adjustable gastric band in August 2012 who was last seen in the office just on August 1 comes in with continued complaints of intolerance to solids and now liquids. We removed some fluid at her last appointment and she did well with liquids. She states a few days ago she started having some intermittent trouble with liquids even while standing. She states last night she developed chills and some right upper quadrant pain. She denies any cough, melena, hematochezia, hematemesis. She has only been regurgitating what she has been taking in by mouth. She also complains of some epigastric pain as well when she takes a deep breath in. She denies any dysuria she denies any sputum production. She denies any fevers.   Past Medical History  Diagnosis Date  . Kidney stone   . Hernia   . Wears glasses   . Anxiety   . Obesity, Class III, BMI 40-49.9 (morbid obesity)   . Migraine headache   . Dyslipidemia     not requiring meds  . Fatty liver disease, nonalcoholic     Past Surgical History  Procedure Laterality Date  . Hernia repair  1985  . Tonsillectomy and adenoidectomy  2007  . Intrauterine device insertion  10/2008    Mirena  . Laparoscopic gastric banding  01/08/11    Dr Andrey Campanile, AP standard    Family History  Problem Relation Age of Onset  . Hypertension Father   . Stroke Father   . Hyperlipidemia Father   . Migraines Mother   . Cancer Mother     cervical  . Hyperlipidemia Mother   . Bipolar disorder Sister   . Migraines Sister   . Breast cancer Paternal Grandmother 79   Social History:  reports that she has never smoked. She has never used smokeless tobacco. She reports that  drinks alcohol. She reports that she does not use illicit drugs.  Allergies: No Known  Allergies   (Not in a hospital admission)  Results for orders placed in visit on 12/25/12 (from the past 48 hour(s))  COMPREHENSIVE METABOLIC PANEL     Status: Abnormal   Collection Time    12/25/12  9:44 AM      Result Value Range   Sodium 141  135 - 145 mEq/L   Potassium 3.8  3.5 - 5.3 mEq/L   Chloride 105  96 - 112 mEq/L   CO2 28  19 - 32 mEq/L   Glucose, Bld 76  70 - 99 mg/dL   BUN 8  6 - 23 mg/dL   Creat 8.11  9.14 - 7.82 mg/dL   Total Bilirubin 1.4 (*) 0.3 - 1.2 mg/dL   Alkaline Phosphatase 56  39 - 117 U/L   AST 28  0 - 37 U/L   ALT 33  0 - 35 U/L   Total Protein 6.7  6.0 - 8.3 g/dL   Albumin 4.2  3.5 - 5.2 g/dL   Calcium 95.6  8.4 - 21.3 mg/dL  CBC WITH DIFFERENTIAL     Status: Abnormal   Collection Time    12/25/12  9:44 AM      Result Value Range   WBC 14.4 (*) 4.0 - 10.5 K/uL   RBC 4.19  3.87 - 5.11  MIL/uL   Hemoglobin 13.4  12.0 - 15.0 g/dL   HCT 40.9  81.1 - 91.4 %   MCV 95.0  78.0 - 100.0 fL   MCH 32.0  26.0 - 34.0 pg   MCHC 33.7  30.0 - 36.0 g/dL   RDW 78.2  95.6 - 21.3 %   Platelets 241  150 - 400 K/uL   Neutrophils Relative % 81 (*) 43 - 77 %   Neutro Abs 11.6 (*) 1.7 - 7.7 K/uL   Lymphocytes Relative 15  12 - 46 %   Lymphs Abs 2.1  0.7 - 4.0 K/uL   Monocytes Relative 4  3 - 12 %   Monocytes Absolute 0.6  0.1 - 1.0 K/uL   Smear Review Criteria for review not met     Dg Abd Acute W/chest  12/25/2012   *RADIOLOGY REPORT*  Clinical Data: Abdominal pain, vomiting.  ACUTE ABDOMEN SERIES (ABDOMEN 2 VIEW & CHEST 1 VIEW)  Comparison: Upper GI performed 05/11/2012  Findings: The lap band has changed orientation in the left upper quadrant.  This is more inferiorly located now and is oriented in the 10 o'clock to 4 o'clock position.  This likely has slipped inferiorly.  No evidence of bowel obstruction.  Moderate stool burden throughout the colon.  No free air.  IUD is noted in the pelvis.  Small calcifications projects over the right kidney, likely right renal  stones. The largest is in the mid pole and measures 5 mm.  No other suspicious calcifications.  No acute bony abnormality.  IMPRESSION: Change in orientation of the lap band device which is now inferiorly located in the left upper quadrant and oriented in the 10 o'clock to 4 o'clock position.  This is likely slipped inferiorly.  No evidence of bowel obstruction or free air.  Right nephrolithiasis.   Original Report Authenticated By: Charlett Nose, M.D.    Review of Systems  Constitutional: Positive for chills and malaise/fatigue. Negative for fever.  Respiratory: Positive for cough. Negative for shortness of breath.   Cardiovascular: Negative for chest pain, palpitations and orthopnea.  Gastrointestinal: Positive for nausea and abdominal pain. Negative for heartburn, diarrhea, constipation, blood in stool and melena.  Genitourinary: Negative for dysuria and urgency.  Musculoskeletal: Positive for back pain.  Neurological: Negative for tingling, tremors and loss of consciousness.  Psychiatric/Behavioral: Negative for substance abuse.    Blood pressure 122/76, pulse 105, temperature 99 F (37.2 C), resp. rate 18, height 5\' 6"  (1.676 m), weight 144 lb 12.8 oz (65.681 kg). Physical Exam  Vitals reviewed. Constitutional: She is oriented to person, place, and time. She appears well-developed. She has a sickly appearance.  HENT:  Head: Normocephalic and atraumatic.  Right Ear: External ear normal.  Left Ear: External ear normal.  Eyes: Conjunctivae are normal. No scleral icterus.  Neck: Normal range of motion. Neck supple. No tracheal deviation present. No thyromegaly present.  Cardiovascular: Normal rate, regular rhythm and normal heart sounds.   Respiratory: Effort normal and breath sounds normal. No respiratory distress.  GI: Soft. She exhibits no distension. There is no tenderness. There is no rebound.  Port in good position. Well healed trocar sites  Musculoskeletal: Normal range of motion.  She exhibits no edema and no tenderness.  Neurological: She is alert and oriented to person, place, and time.  Skin: Skin is warm and dry. She is not diaphoretic.  Psychiatric: She has a normal mood and affect. Her behavior is normal. Judgment and thought content normal.  Assessment/Plan S/P LAGB 12/2010 - now with acute anterior slippage.  Abdominal pain/chills/po intolerance  The patient was seen earlier in the office which prompted the workup consisting of x-rays and labs. After reviewing the x-rays it is clear that the patient has had slip of her LapBand. Based on her symptoms, chills and mild leukocytosis I recommended that she come to the hospital to be direct admitted. I contacted the patient via the phone and discussed with her the x-ray findings and my recommendations. I told her that I elective surgery was not an option.   We discussed the diagnosis and the typical treatment. We discussed laparoscopy with lap band revision versus removal of her lap band. We discussed the pros and cons of each. Right now she is going to think about it. In the interim she was instructed to have her husband drive her to the hospital to the admitting desk. She will be admitted. IV fluid resuscitation. The pain and nausea medication.  Dr Ezzard Standing will assess her after she arrives. As long as she appears clinically stable the plan will be to go to the operating room Saturday morning. The patient has agreed with the plan.  Mary Sella. Andrey Campanile, MD, FACS General, Bariatric, & Minimally Invasive Surgery [facility], [place]   [facility] M 12/25/2012, 1:15 PM

## 2012-12-25 NOTE — Progress Notes (Addendum)
Subjective:     Patient ID: Kristen [name], female   DOB: 26-Jun-1980, 32 y.o.   MRN: [id number]  HPI 32 year old Caucasian female status post laparoscopic adjustable gastric band in August 2012 who was last seen in the office just on August 1 comes in with continued complaints of intolerance to solids and now liquids. We removed some fluid at her last appointment and she did well with liquids. She states a few days ago she started having some intermittent trouble with liquids even while standing. She states last night she developed chills and some right upper quadrant pain. She denies any cough, melena, hematochezia, hematemesis. She has only been regurgitating what she has been taking in by mouth. She also complains of some epigastric pain as well when she takes a deep breath in. She denies any dysuria she denies any sputum production. She denies any fevers.  PMHx, PSHx, SOCHx, FAMHx, ALL reviewed and unchanged  Review of Systems 8 point ROS performed and negative except for above    Objective:   Physical Exam BP 122/76  Pulse 105  Temp(Src) 99 F (37.2 C)  Resp 18  Ht 5\' 6"  (1.676 m)  Wt 144 lb 12.8 oz (65.681 kg)  BMI 23.38 kg/m2  See LapBand flowsheet  Gen: alert, non-toxic appearing, not her normal happy self HEENT: normocephalic, atraumatic; pupils equal, no scleral icterus, neck supple, Pulm: Lungs clear to auscultation, symmetric chest rise CV: regular rate and rhythm Abd: soft, nontender, nondistended. Well-healed trocar sites. No incisional hernia. Port is in right mid-abdomen Ext:  normal, Neuro: nonfocal, sensation grossly intact Psych: appropriate, judgment normal     Assessment:     S/p LAGB Persistent intermittent intolerance to PO Chills RUQ pain     Plan:     Not exactly sure what is going on. For the past several appointments she has been reporting intermittent intolerance to solids and sometimes liquids primarily when she is sitting down but able to  tolerate it when she is standing. We have been slowly removing some fluid from her band. However today she looks different. She is not as bubbly as she normally does. I believe we need to completely empty her band today and get her a band vacation. The differential includes a too tight LapBand, slipped band, gallbladder issues (she has lost about 140 pounds) or other process  After obtaining verbal consent, the abdominal wall was prepped with Chloraprep. The port was accessed with a Huber needle and 1.6 cc of saline was removed to give the patient an expected fill volume of 0 cc.  The patient was able to tolerate sips of water.  We will also check stat CMET and CBC this am and get an acute abdominal series to look at band position. If band appears to be in good position and pt still symptomatic will get an abdominal ultrasound to evaluate gallbladder.  If labs are abnormal that will also spur additional evaluation.   She was also instructed to call for worsening symptoms like fever, abdominal pain, intolerance to solids and liquids or any questions.  I informed the patient that Im  Starting vacation tomorrow however I would followup on her labs while in the office today as well as her x-ray. I also told her I would inform one of my bariatric partners.  F/u 4 weeks or sooner depending on results  1:21 PM-update- Abdominal x-ray reveals acute anterior LapBand slippage. The patient was contacted. She is being admitted to the hospital. Please see  my separate H&P Mary Sella. Andrey Campanile, MD, FACS General, Bariatric, & Minimally Invasive Surgery [facility], [place]

## 2012-12-25 NOTE — Progress Notes (Signed)
Utilkization review completed.

## 2012-12-25 NOTE — Patient Instructions (Addendum)
Get your blood work and xray done today If symptoms worsen - fever >101, chills, abdominal pain, intolerance to food, nausea - call the office [phone] Drink plenty of liquids Continue to take reflux medication

## 2012-12-26 ENCOUNTER — Encounter (HOSPITAL_COMMUNITY): Payer: Self-pay | Admitting: Anesthesiology

## 2012-12-26 ENCOUNTER — Encounter (HOSPITAL_COMMUNITY)
Admission: AD | Disposition: A | Discharge status: Home / Self Care | Payer: Self-pay | Source: Ambulatory Visit | Attending: General Surgery

## 2012-12-26 ENCOUNTER — Inpatient Hospital Stay (HOSPITAL_COMMUNITY): Payer: BC Managed Care – PPO | Admitting: Anesthesiology

## 2012-12-26 ENCOUNTER — Inpatient Hospital Stay: Admit: 2012-12-26 | Payer: Self-pay | Admitting: General Surgery

## 2012-12-26 ENCOUNTER — Encounter (HOSPITAL_COMMUNITY): Payer: Self-pay | Admitting: Registered Nurse

## 2012-12-26 DIAGNOSIS — T85898A Other specified complication of other internal prosthetic devices, implants and grafts, initial encounter: Secondary | ICD-10-CM

## 2012-12-26 HISTORY — PX: LAPAROSCOPIC GASTRIC BANDING: SHX1100

## 2012-12-26 LAB — COMPREHENSIVE METABOLIC PANEL
CO2: 25 mEq/L (ref 19–32)
Calcium: 8.9 mg/dL (ref 8.4–10.5)
Creatinine, Ser: 0.49 mg/dL — ABNORMAL LOW (ref 0.50–1.10)
GFR calc Af Amer: >90 mL/min (ref >90)
GFR calc non Af Amer: >90 mL/min (ref >90)
Glucose, Bld: 106 mg/dL — ABNORMAL HIGH (ref 70–99)

## 2012-12-26 LAB — CBC
HCT: 32.4 % — ABNORMAL LOW (ref 36.0–46.0)
Hemoglobin: 11.3 g/dL — ABNORMAL LOW (ref 12.0–15.0)
MCH: 31.6 pg (ref 26.0–34.0)
MCV: 90.5 fL (ref 78.0–100.0)
RBC: 3.58 MIL/uL — ABNORMAL LOW (ref 3.87–5.11)

## 2012-12-26 SURGERY — GASTRIC BANDING, LAPAROSCOPIC
Anesthesia: General | Site: Abdomen | Wound class: Clean

## 2012-12-26 MED ORDER — ONDANSETRON HCL 4 MG/2ML IJ SOLN: 4.0000 mg | INTRAMUSCULAR | Status: DC | PRN

## 2012-12-26 MED ORDER — PROPOFOL INFUSION 10 MG/ML OPTIME
INTRAVENOUS | Status: DC | PRN
  Administered 2012-12-26: 120 ug/kg/min via INTRAVENOUS

## 2012-12-26 MED ORDER — MIDAZOLAM HCL 2 MG/2ML IJ SOLN: 0.5000 mg | Freq: Once | INTRAMUSCULAR | Status: DC | PRN

## 2012-12-26 MED ORDER — ONDANSETRON HCL 4 MG/2ML IJ SOLN
INTRAMUSCULAR | Status: DC | PRN
  Administered 2012-12-26: 4 mg via INTRAVENOUS

## 2012-12-26 MED ORDER — MIDAZOLAM HCL 5 MG/5ML IJ SOLN
INTRAMUSCULAR | Status: DC | PRN
  Administered 2012-12-26: 2 mg via INTRAVENOUS

## 2012-12-26 MED ORDER — PROPOFOL 10 MG/ML IV BOLUS
INTRAVENOUS | Status: DC | PRN
  Administered 2012-12-26: 200 mg via INTRAVENOUS

## 2012-12-26 MED ORDER — MIDAZOLAM HCL 5 MG/5ML IJ SOLN: INTRAMUSCULAR | Status: DC | PRN

## 2012-12-26 MED ORDER — NEOSTIGMINE METHYLSULFATE 1 MG/ML IJ SOLN
INTRAMUSCULAR | Status: DC | PRN
  Administered 2012-12-26: 3 mg via INTRAVENOUS

## 2012-12-26 MED ORDER — CEFAZOLIN SODIUM-DEXTROSE 2-3 GM-% IV SOLR: 2.0000 g | Freq: Once | INTRAVENOUS | Status: DC

## 2012-12-26 MED ORDER — OXYCODONE-ACETAMINOPHEN 5-325 MG/5ML PO SOLN: 5.0000 mL | ORAL | Status: DC | PRN

## 2012-12-26 MED ORDER — SCOPOLAMINE 1 MG/3DAYS TD PT72
MEDICATED_PATCH | TRANSDERMAL | Status: DC | PRN
  Administered 2012-12-26: 1 via TRANSDERMAL

## 2012-12-26 MED ORDER — LACTATED RINGERS IV SOLN
INTRAVENOUS | Status: DC | PRN
  Administered 2012-12-26 (×2): via INTRAVENOUS

## 2012-12-26 MED ORDER — LIDOCAINE HCL (CARDIAC) 20 MG/ML IV SOLN
INTRAVENOUS | Status: DC | PRN
  Administered 2012-12-26: 75 mg via INTRAVENOUS

## 2012-12-26 MED ORDER — UNJURY CHICKEN SOUP POWDER
2.0000 [oz_av] | Freq: Four times a day (QID) | ORAL | Status: DC
  Administered 2012-12-27: 2 [oz_av] via ORAL
  Filled 2012-12-26 (×4): qty 27

## 2012-12-26 MED ORDER — UNJURY VANILLA POWDER
2.0000 [oz_av] | Freq: Four times a day (QID) | ORAL | Status: DC
  Filled 2012-12-26 (×4): qty 27

## 2012-12-26 MED ORDER — ROCURONIUM BROMIDE 100 MG/10ML IV SOLN
INTRAVENOUS | Status: DC | PRN
  Administered 2012-12-26: 5 mg via INTRAVENOUS
  Administered 2012-12-26: 10 mg via INTRAVENOUS
  Administered 2012-12-26: 35 mg via INTRAVENOUS
  Administered 2012-12-26: 10 mg via INTRAVENOUS

## 2012-12-26 MED ORDER — FLUCONAZOLE 150 MG PO TABS
150.0000 mg | ORAL_TABLET | Freq: Once | ORAL | Status: AC
  Administered 2012-12-26: 150 mg via ORAL
  Filled 2012-12-26: qty 1

## 2012-12-26 MED ORDER — BUPIVACAINE-EPINEPHRINE 0.25% -1:200000 IJ SOLN
INTRAMUSCULAR | Status: DC | PRN
  Administered 2012-12-26: 30 mL

## 2012-12-26 MED ORDER — LACTATED RINGERS IR SOLN
Status: DC | PRN
  Administered 2012-12-26: 1

## 2012-12-26 MED ORDER — CEFAZOLIN SODIUM-DEXTROSE 2-3 GM-% IV SOLR
INTRAVENOUS | Status: DC | PRN
  Administered 2012-12-26: 2 g via INTRAVENOUS

## 2012-12-26 MED ORDER — KETOROLAC TROMETHAMINE 30 MG/ML IJ SOLN: 15.0000 mg | Freq: Once | INTRAMUSCULAR | Status: DC | PRN

## 2012-12-26 MED ORDER — GLYCOPYRROLATE 0.2 MG/ML IJ SOLN
INTRAMUSCULAR | Status: DC | PRN
  Administered 2012-12-26: 0.4 mg via INTRAVENOUS

## 2012-12-26 MED ORDER — FENTANYL CITRATE 0.05 MG/ML IJ SOLN: 25.0000 ug | INTRAMUSCULAR | Status: DC | PRN

## 2012-12-26 MED ORDER — PROMETHAZINE HCL 25 MG/ML IJ SOLN
6.2500 mg | INTRAMUSCULAR | Status: DC | PRN
  Administered 2012-12-26: 12.5 mg via INTRAVENOUS

## 2012-12-26 MED ORDER — UNJURY CHOCOLATE CLASSIC POWDER
2.0000 [oz_av] | Freq: Four times a day (QID) | ORAL | Status: DC
  Administered 2012-12-27: 2 [oz_av] via ORAL
  Filled 2012-12-26 (×4): qty 27

## 2012-12-26 MED ORDER — MORPHINE SULFATE 2 MG/ML IJ SOLN
1.0000 mg | INTRAMUSCULAR | Status: DC | PRN
  Administered 2012-12-26: 2 mg via INTRAVENOUS
  Administered 2012-12-26 (×3): 4 mg via INTRAVENOUS
  Administered 2012-12-27 (×3): 2 mg via INTRAVENOUS
  Administered 2012-12-27: 4 mg via INTRAVENOUS
  Filled 2012-12-26: qty 1
  Filled 2012-12-26: qty 2
  Filled 2012-12-26: qty 1
  Filled 2012-12-26 (×2): qty 2
  Filled 2012-12-26: qty 1
  Filled 2012-12-26: qty 2
  Filled 2012-12-26: qty 1

## 2012-12-26 MED ORDER — ACETAMINOPHEN 10 MG/ML IV SOLN
1000.0000 mg | Freq: Four times a day (QID) | INTRAVENOUS | Status: AC
  Administered 2012-12-26 – 2012-12-27 (×4): 1000 mg via INTRAVENOUS
  Filled 2012-12-26 (×4): qty 100

## 2012-12-26 MED ORDER — SUCCINYLCHOLINE CHLORIDE 20 MG/ML IJ SOLN
INTRAMUSCULAR | Status: DC | PRN
  Administered 2012-12-26: 100 mg via INTRAVENOUS

## 2012-12-26 MED ORDER — DEXAMETHASONE SODIUM PHOSPHATE 10 MG/ML IJ SOLN
INTRAMUSCULAR | Status: DC | PRN
  Administered 2012-12-26: 10 mg via INTRAVENOUS

## 2012-12-26 MED ORDER — SUFENTANIL CITRATE 50 MCG/ML IV SOLN
INTRAVENOUS | Status: DC | PRN
  Administered 2012-12-26: 10 ug via INTRAVENOUS
  Administered 2012-12-26: 5 ug via INTRAVENOUS
  Administered 2012-12-26 (×3): 10 ug via INTRAVENOUS
  Administered 2012-12-26 (×2): 5 ug via INTRAVENOUS
  Administered 2012-12-26: 10 ug via INTRAVENOUS

## 2012-12-26 MED ORDER — OXYCODONE-ACETAMINOPHEN 5-325 MG/5ML PO SOLN
5.0000 mL | ORAL | Status: DC | PRN
  Administered 2012-12-27 (×2): 10 mL via ORAL
  Filled 2012-12-26 (×2): qty 10

## 2012-12-26 MED ORDER — MORPHINE SULFATE 2 MG/ML IJ SOLN
INTRAMUSCULAR | Status: AC
  Filled 2012-12-26: qty 1

## 2012-12-26 MED ORDER — MEPERIDINE HCL 50 MG/ML IJ SOLN: 6.2500 mg | INTRAMUSCULAR | Status: DC | PRN

## 2012-12-26 MED ORDER — HEPARIN SODIUM (PORCINE) 5000 UNIT/ML IJ SOLN
5000.0000 [IU] | Freq: Three times a day (TID) | INTRAMUSCULAR | Status: DC
  Administered 2012-12-27 (×2): 5000 [IU] via SUBCUTANEOUS
  Filled 2012-12-26 (×4): qty 1

## 2012-12-26 MED ORDER — KCL IN DEXTROSE-NACL 20-5-0.45 MEQ/L-%-% IV SOLN
INTRAVENOUS | Status: DC
  Administered 2012-12-26 – 2012-12-27 (×2): via INTRAVENOUS
  Filled 2012-12-26 (×4): qty 1000

## 2012-12-26 MED ORDER — ACETAMINOPHEN 160 MG/5ML PO SOLN
650.0000 mg | ORAL | Status: DC | PRN
  Filled 2012-12-26: qty 20.3

## 2012-12-26 MED ORDER — METOCLOPRAMIDE HCL 5 MG/ML IJ SOLN
INTRAMUSCULAR | Status: DC | PRN
  Administered 2012-12-26: 10 mg via INTRAVENOUS

## 2012-12-26 SURGICAL SUPPLY — 53 items
BENZOIN TINCTURE PRP APPL 2/3 (GAUZE/BANDAGES/DRESSINGS) IMPLANT
BLADE HEX COATED 2.75 (ELECTRODE) ×2 IMPLANT
BLADE SURG 15 STRL LF DISP TIS (BLADE) ×1 IMPLANT
BLADE SURG 15 STRL SS (BLADE) ×1
BLADE SURG SZ11 CARB STEEL (BLADE) ×2 IMPLANT
CANISTER SUCTION 2500CC (MISCELLANEOUS) IMPLANT
CHLORAPREP W/TINT 26ML (MISCELLANEOUS) ×2 IMPLANT
CLOTH BEACON ORANGE TIMEOUT ST (SAFETY) ×2 IMPLANT
DECANTER SPIKE VIAL GLASS SM (MISCELLANEOUS) ×2 IMPLANT
DERMABOND ADVANCED (GAUZE/BANDAGES/DRESSINGS) ×1
DERMABOND ADVANCED .7 DNX12 (GAUZE/BANDAGES/DRESSINGS) ×1 IMPLANT
DEVICE SUT QUICK LOAD TK 5 (STAPLE) ×10 IMPLANT
DEVICE SUT TI-KNOT TK 5X26 (MISCELLANEOUS) ×2 IMPLANT
DEVICE SUTURE ENDOST 10MM (ENDOMECHANICALS) IMPLANT
DEVICE TROCAR PUNCTURE CLOSURE (ENDOMECHANICALS) ×2 IMPLANT
DISSECTOR BLUNT TIP ENDO 5MM (MISCELLANEOUS) IMPLANT
DRAPE CAMERA CLOSED 9X96 (DRAPES) ×2 IMPLANT
DRAPE UTILITY XL STRL (DRAPES) ×6 IMPLANT
ELECT REM PT RETURN 9FT ADLT (ELECTROSURGICAL) ×2
ELECTRODE REM PT RTRN 9FT ADLT (ELECTROSURGICAL) ×1 IMPLANT
GLOVE BIO SURGEON STRL SZ7.5 (GLOVE) ×2 IMPLANT
GLOVE BIOGEL M STRL SZ7.5 (GLOVE) IMPLANT
GLOVE INDICATOR 8.0 STRL GRN (GLOVE) ×2 IMPLANT
GOWN STRL NON-REIN LRG LVL3 (GOWN DISPOSABLE) ×2 IMPLANT
GOWN STRL REIN XL XLG (GOWN DISPOSABLE) ×4 IMPLANT
HOVERMATT SINGLE USE (MISCELLANEOUS) IMPLANT
KIT BASIN OR (CUSTOM PROCEDURE TRAY) ×2 IMPLANT
NEEDLE INSUFFLATION 14GA 150MM (NEEDLE) ×2 IMPLANT
NEEDLE SPNL 22GX3.5 QUINCKE BK (NEEDLE) ×2 IMPLANT
NS IRRIG 1000ML POUR BTL (IV SOLUTION) ×2 IMPLANT
PACK UNIVERSAL I (CUSTOM PROCEDURE TRAY) ×2 IMPLANT
PENCIL BUTTON HOLSTER BLD 10FT (ELECTRODE) ×2 IMPLANT
SCALPEL HARMONIC ACE (MISCELLANEOUS) IMPLANT
SET IRRIG TUBING LAPAROSCOPIC (IRRIGATION / IRRIGATOR) ×2 IMPLANT
SOLUTION ANTI FOG 6CC (MISCELLANEOUS) ×2 IMPLANT
SPONGE LAP 18X18 X RAY DECT (DISPOSABLE) ×2 IMPLANT
STAPLER VISISTAT 35W (STAPLE) IMPLANT
STRIP CLOSURE SKIN 1/2X4 (GAUZE/BANDAGES/DRESSINGS) IMPLANT
SUT ETHIBOND 2 0 SH (SUTURE) ×5
SUT ETHIBOND 2 0 SH 36X2 (SUTURE) ×5 IMPLANT
SUT MNCRL AB 4-0 PS2 18 (SUTURE) ×2 IMPLANT
SUT PROLENE 2 0 CT2 30 (SUTURE) ×2 IMPLANT
SUT SILK 0 (SUTURE) ×1
SUT SILK 0 30XBRD TIE 6 (SUTURE) ×1 IMPLANT
SUT SURGIDAC NAB ES-9 0 48 120 (SUTURE) IMPLANT
SUT VIC AB 2-0 SH 27 (SUTURE) ×1
SUT VIC AB 2-0 SH 27X BRD (SUTURE) ×1 IMPLANT
SYR 20CC LL (SYRINGE) ×2 IMPLANT
SYR CONTROL 10ML LL (SYRINGE) ×2 IMPLANT
TOWEL OR 17X26 10 PK STRL BLUE (TOWEL DISPOSABLE) ×2 IMPLANT
TROCAR BLADELESS OPT 5 100 (ENDOMECHANICALS) ×6 IMPLANT
TUBE CALIBRATION LAPBAND (TUBING) ×2 IMPLANT
TUBING INSUFFLATION 10FT LAP (TUBING) ×2 IMPLANT

## 2012-12-26 NOTE — Transfer of Care (Signed)
Immediate Anesthesia Transfer of Care Note  Patient: Kristen [name]  Procedure(s) Performed: Procedure(s): LAPAROSCOPIC GASTRIC BANDING REVISION  (N/A)  Patient Location: PACU  Anesthesia Type:General  Level of Consciousness: awake, alert , oriented and patient cooperative  Airway & Oxygen Therapy: Patient Spontanous Breathing and Patient connected to face mask oxygen  Post-op Assessment: Report given to PACU RN, Post -op Vital signs reviewed and stable and Patient moving all extremities X 4  Post vital signs: stable  Complications: No apparent anesthesia complications

## 2012-12-26 NOTE — Interval H&P Note (Signed)
History and Physical Interval Note:  12/26/2012 7:53 AM  Harvin Hazel B Foley  has presented today for surgery, with the diagnosis of slipped laparoscopic band  The various methods of treatment have been discussed with the patient and family. After consideration of risks, benefits and other options for treatment, the patient has consented to  Procedure(s): LAPAROSCOPIC GASTRIC BANDING REVISION  (N/A) as a surgical intervention .  The patient's history has been reviewed, patient examined, no change in status, stable for surgery.  I have reviewed the patient's chart and labs.  Questions were answered to the patient's satisfaction.    Afebrile. Not tachy. No abd pain. Wbc normalized this am - elevated prob secondary to dehydration.  Nontender.   We discussed the risk and benefits of surgery including but not limited to bleeding, infection, injury to surrounding structures, blood clot formation such as deep venous thrombosis or pulmonary embolism, need to convert to an open procedure, recurrent band slippage, possible resection of stomach, band erosion, port complications (leak or flippage), potential need for reoperative surgery, esophageal dilatation, worsening reflux, and vitamin deficiencies. We discussed the typical post operative recovery course. We discussed that their postoperative diet will be modified for several weeks.   Mary Sella. Andrey Campanile, MD, FACS General, Bariatric, & Minimally Invasive Surgery [facility], [place]    [facility]

## 2012-12-26 NOTE — Anesthesia Postprocedure Evaluation (Signed)
  Anesthesia Post Note  Patient: Kristen [name]  Procedure(s) Performed: Procedure(s) (LRB): LAPAROSCOPIC GASTRIC BANDING REVISION  (N/A)  Anesthesia type: GA  Patient location: PACU  Post pain: Pain level controlled  Post assessment: Post-op Vital signs reviewed  Last Vitals:  Filed Vitals:   12/26/12 1130  BP: 95/59  Pulse: 61  Temp: 36.9 C  Resp: 10    Post vital signs: Reviewed  Level of consciousness: sedated  Complications: No apparent anesthesia complications

## 2012-12-26 NOTE — Progress Notes (Signed)
Patient complaining of yeast in vaginal area and ? If kidney stones.  Notified Dr Daphine Deutscher. Order given for Diflucan and send a UA to check urine.  Notified patient of plan of care, patient verbalizes understanding.

## 2012-12-26 NOTE — H&P (View-Only) (Signed)
Kristen [name] is an 31 y.o. female.      THIS NOTE WAS COMPLETED BASED ON MY EXAM AND HISTORY OF THE PT PERFORMED THIS AM IN THE OFFICE.   Chief Complaint: abd pain HPI: 31-year-old Caucasian female status post laparoscopic adjustable gastric band in August 2012 who was last seen in the office just on August 1 comes in with continued complaints of intolerance to solids and now liquids. We removed some fluid at her last appointment and she did well with liquids. She states a few days ago she started having some intermittent trouble with liquids even while standing. She states last night she developed chills and some right upper quadrant pain. She denies any cough, melena, hematochezia, hematemesis. She has only been regurgitating what she has been taking in by mouth. She also complains of some epigastric pain as well when she takes a deep breath in. She denies any dysuria she denies any sputum production. She denies any fevers.   Past Medical History  Diagnosis Date  . Kidney stone   . Hernia   . Wears glasses   . Anxiety   . Obesity, Class III, BMI 40-49.9 (morbid obesity)   . Migraine headache   . Dyslipidemia     not requiring meds  . Fatty liver disease, nonalcoholic     Past Surgical History  Procedure Laterality Date  . Hernia repair  1985  . Tonsillectomy and adenoidectomy  2007  . Intrauterine device insertion  10/2008    Mirena  . Laparoscopic gastric banding  01/08/11    Dr Airika Alkhatib, AP standard    Family History  Problem Relation Age of Onset  . Hypertension Father   . Stroke Father   . Hyperlipidemia Father   . Migraines Mother   . Cancer Mother     cervical  . Hyperlipidemia Mother   . Bipolar disorder Sister   . Migraines Sister   . Breast cancer Paternal Grandmother 62   Social History:  reports that she has never smoked. She has never used smokeless tobacco. She reports that  drinks alcohol. She reports that she does not use illicit drugs.  Allergies: No Known  Allergies   (Not in a hospital admission)  Results for orders placed in visit on 12/25/12 (from the past 48 hour(s))  COMPREHENSIVE METABOLIC PANEL     Status: Abnormal   Collection Time    12/25/12  9:44 AM      Result Value Range   Sodium 141  135 - 145 mEq/L   Potassium 3.8  3.5 - 5.3 mEq/L   Chloride 105  96 - 112 mEq/L   CO2 28  19 - 32 mEq/L   Glucose, Bld 76  70 - 99 mg/dL   BUN 8  6 - 23 mg/dL   Creat 0.50  0.50 - 1.10 mg/dL   Total Bilirubin 1.4 (*) 0.3 - 1.2 mg/dL   Alkaline Phosphatase 56  39 - 117 U/L   AST 28  0 - 37 U/L   ALT 33  0 - 35 U/L   Total Protein 6.7  6.0 - 8.3 g/dL   Albumin 4.2  3.5 - 5.2 g/dL   Calcium 10.1  8.4 - 10.5 mg/dL  CBC WITH DIFFERENTIAL     Status: Abnormal   Collection Time    12/25/12  9:44 AM      Result Value Range   WBC 14.4 (*) 4.0 - 10.5 K/uL   RBC 4.19  3.87 - 5.11   MIL/uL   Hemoglobin 13.4  12.0 - 15.0 g/dL   HCT 39.8  36.0 - 46.0 %   MCV 95.0  78.0 - 100.0 fL   MCH 32.0  26.0 - 34.0 pg   MCHC 33.7  30.0 - 36.0 g/dL   RDW 12.0  11.5 - 15.5 %   Platelets 241  150 - 400 K/uL   Neutrophils Relative % 81 (*) 43 - 77 %   Neutro Abs 11.6 (*) 1.7 - 7.7 K/uL   Lymphocytes Relative 15  12 - 46 %   Lymphs Abs 2.1  0.7 - 4.0 K/uL   Monocytes Relative 4  3 - 12 %   Monocytes Absolute 0.6  0.1 - 1.0 K/uL   Smear Review Criteria for review not met     Dg Abd Acute W/chest  12/25/2012   *RADIOLOGY REPORT*  Clinical Data: Abdominal pain, vomiting.  ACUTE ABDOMEN SERIES (ABDOMEN 2 VIEW & CHEST 1 VIEW)  Comparison: Upper GI performed 05/11/2012  Findings: The lap band has changed orientation in the left upper quadrant.  This is more inferiorly located now and is oriented in the 10 o'clock to 4 o'clock position.  This likely has slipped inferiorly.  No evidence of bowel obstruction.  Moderate stool burden throughout the colon.  No free air.  IUD is noted in the pelvis.  Small calcifications projects over the right kidney, likely right renal  stones. The largest is in the mid pole and measures 5 mm.  No other suspicious calcifications.  No acute bony abnormality.  IMPRESSION: Change in orientation of the lap band device which is now inferiorly located in the left upper quadrant and oriented in the 10 o'clock to 4 o'clock position.  This is likely slipped inferiorly.  No evidence of bowel obstruction or free air.  Right nephrolithiasis.   Original Report Authenticated By: Kevin Dover, [name].D.    Review of Systems  Constitutional: Positive for chills and malaise/fatigue. Negative for fever.  Respiratory: Positive for cough. Negative for shortness of breath.   Cardiovascular: Negative for chest pain, palpitations and orthopnea.  Gastrointestinal: Positive for nausea and abdominal pain. Negative for heartburn, diarrhea, constipation, blood in stool and melena.  Genitourinary: Negative for dysuria and urgency.  Musculoskeletal: Positive for back pain.  Neurological: Negative for tingling, tremors and loss of consciousness.  Psychiatric/Behavioral: Negative for substance abuse.    Blood pressure 122/76, pulse 105, temperature 99 F (37.2 C), resp. rate 18, height 5' 6" (1.676 [name]), weight 144 lb 12.8 oz (65.681 kg). Physical Exam  Vitals reviewed. Constitutional: She is oriented to person, place, and time. She appears well-developed. She has a sickly appearance.  HENT:  Head: Normocephalic and atraumatic.  Right Ear: External ear normal.  Left Ear: External ear normal.  Eyes: Conjunctivae are normal. No scleral icterus.  Neck: Normal range of motion. Neck supple. No tracheal deviation present. No thyromegaly present.  Cardiovascular: Normal rate, regular rhythm and normal heart sounds.   Respiratory: Effort normal and breath sounds normal. No respiratory distress.  GI: Soft. She exhibits no distension. There is no tenderness. There is no rebound.  Port in good position. Well healed trocar sites  Musculoskeletal: Normal range of motion.  She exhibits no edema and no tenderness.  Neurological: She is alert and oriented to person, place, and time.  Skin: Skin is warm and dry. She is not diaphoretic.  Psychiatric: She has a normal mood and affect. Her behavior is normal. Judgment and thought content normal.       Assessment/Plan S/P LAGB 12/2010 - now with acute anterior slippage.  Abdominal pain/chills/po intolerance  The patient was seen earlier in the office which prompted the workup consisting of x-rays and labs. After reviewing the x-rays it is clear that the patient has had slip of her LapBand. Based on her symptoms, chills and mild leukocytosis I recommended that she come to the hospital to be direct admitted. I contacted the patient via the phone and discussed with her the x-ray findings and my recommendations. I told her that I elective surgery was not an option.   We discussed the diagnosis and the typical treatment. We discussed laparoscopy with lap band revision versus removal of her lap band. We discussed the pros and cons of each. Right now she is going to think about it. In the interim she was instructed to have her husband drive her to the hospital to the admitting desk. She will be admitted. IV fluid resuscitation. The pain and nausea medication.  Dr Newman will assess her after she arrives. As long as she appears clinically stable the plan will be to go to the operating room Saturday morning. The patient has agreed with the plan.  Kristen Coate [name]. Victorino Fatzinger, MD, FACS General, Bariatric, & Minimally Invasive Surgery [facility] [place]   Kristen [name] 12/25/2012, 1:15 PM    

## 2012-12-26 NOTE — Anesthesia Preprocedure Evaluation (Addendum)
Anesthesia Evaluation  Patient identified by MRN, date of birth, ID band Patient awake    Reviewed: Allergy & Precautions, H&P , Patient's Chart, lab work & pertinent test results, reviewed documented beta blocker date and time   History of Anesthesia Complications Negative for: history of anesthetic complications  Airway Mallampati: II TM Distance: >3 FB Neck ROM: full    Dental no notable dental hx.    Pulmonary neg pulmonary ROS,  breath sounds clear to auscultation  Pulmonary exam normal       Cardiovascular Exercise Tolerance: Good negative cardio ROS  Rhythm:regular Rate:Normal     Neuro/Psych negative neurological ROS  negative psych ROS   GI/Hepatic negative GI ROS, Neg liver ROS,   Endo/Other  negative endocrine ROS  Renal/GU Renal diseasenegative Renal ROS     Musculoskeletal   Abdominal   Peds  Hematology negative hematology ROS (+)   Anesthesia Other Findings   Reproductive/Obstetrics negative OB ROS                           Anesthesia Physical Anesthesia Plan  ASA: II  Anesthesia Plan: General ETT   Post-op Pain Management:    Induction: Rapid sequence, Cricoid pressure planned and Intravenous  Airway Management Planned: Oral ETT  Additional Equipment:   Intra-op Plan:   Post-operative Plan:   Informed Consent: I have reviewed the patients History and Physical, chart, labs and discussed the procedure including the risks, benefits and alternatives for the proposed anesthesia with the patient or authorized representative who has indicated his/her understanding and acceptance.   Dental Advisory Given  Plan Discussed with: CRNA and Surgeon  Anesthesia Plan Comments:        Anesthesia Quick Evaluation

## 2012-12-26 NOTE — Op Note (Signed)
12/25/2012 - 12/26/2012 12/26/2012  10:48 AM  PATIENT:  Kristen [name]  32 y.o. female [id number]  PRE-OPERATIVE DIAGNOSIS:  slipped laparoscopic adjustable gastric band  POST-OPERATIVE DIAGNOSIS:  Anterior slippage of Laparoscopic adjustable gastric band  PROCEDURE:  Procedure(s): LAPAROSCOPIC ADJUSTABLE GASTRIC BANDING REVISION   SURGEON:  Surgeon(s): Atilano Ina, MD  ASSISTANTS: Luretha Murphy, MD    ANESTHESIA:   local and general  DRAINS: none   LOCAL MEDICATIONS USED:  MARCAINE     SPECIMEN:  No Specimen  DISPOSITION OF SPECIMEN:  N/A  COUNTS:  YES  INDICATION FOR PROCEDURE: 32 year old Caucasian female who underwent laparoscopic adjustable gastric band placement in August 2012 for morbid obesity. Her body mass index at that time was 46. The patient has had tremendous success with her weight loss surgery. Her weight loss has been more than 140 pounds since surgery. Of late she has been having some intermittent intolerance to oral intake. We have been reducing the fluid in her lap band. However she came into the clinic on early Friday morning with complaints of epigastric and right upper quadrant pain along with back pain. She appeared ill appearing. Her vital signs are stable. We drained her lap band at that time. She was sent for labs as well as an abdominal x-ray series. Her x-ray revealed findings consistent with a slipped lap band. She had a leukocytosis of 14,000. Based on her discomfort, elevated white blood cell count and the LAP-BAND slip I recommended admission to the hospital for IV fluid resuscitation and operative revision of her slipped lap band. We discussed the risk and benefits of surgery including but not limited to bleeding, infection, injury to surrounding structures, conversion to an open procedure, blood clot formation, possible need for gastric resection, band slippage, band erosion, anesthesia complications, reflux, inability to reposition the band requiring  additional procedures. We discussed the typical postoperative course. She elected to proceed to the operating room  PROCEDURE: After obtaining informed consent, the patient was taken to operating room one at [facility]. She was placed supine on the operating table. General endotracheal anesthesia was established. Sequential compression devices were placed. A [name] catheter was placed. She received Ancef prior to skin incision. A surgical timeout was performed. Using her old left subcostal incision, local was infiltrated. A small 1 cm incision was made through her old scar in that area. I then advanced the Veress needle through all layers of the abdominal wall and safely entered the abdominal cavity. The saline drop test was easily. Pneumoperitoneum was smoothly established the patient pressure of 15 mm mercury. The Veress needle was removed and a 5 mm trocar with a 5 mm camera was advanced are all layers of the abdominal wall using the Optiview technique. The abdominal cavity was surveilled. There is no evidence of injury to surrounding structures. A 5 mm trocar was placed through an old trocar site slightly to the left of the umbilicus under direct visualization. A 5 mm trocar was placed in the right lateral abdominal wall through an old incision. An 11 mm trocar was placed slightly below the level of the port in the right midabdomen. The trochars were placed under direct visualization after local been infiltrated. The patient was placed in reverse Trendelenburg. A 5 mm trocar was placed through her old incision in the subxiphoid position. The [facility] liver retractor was used to lift up the left lobe of the liver. There were some adhesions between the liver and the lesser curvature of the  stomach. Visualizing the band it appeared that there is been an anterior slippage of stomach through the band. We began by taking down the adhesions to the undersurface the left lobe of the liver. This was done with  electrocautery as well as with scissors without electrocautery. The liver retractor was repositioned to further expose the proximal stomach and GE junction. I then sent down the old plication sutures and the capsule That was directly on top of the band. This was done with scissors without heat. This was done in a tedious fashion to make sure that we were not entering The stomach but rather just incising the fibrous capsule surrounding the lap band. Once this was done I am unbuckled the LapBand. Once the capsule surrounding the anterior portion of the lap band was divided, we were able to reduce the herniated stomach from below underneath the lap band. The band was freely mobile at this point remaining it to rotate it clockwise and counterclockwise. I was able to reposition the band in the correct position. At this point we had anesthesia place the calibration tubing through the oropharynx down the esophagus into the stomach. The anatomy was visualized and it appeared that the herniated stomach had been completely reduced. At this point I buckle the band over the calibration tubing. The calibration tubing was then removed and discarded. I then placed 4 gastrogastric plication sutures Using 0 Ethibond sutures and tying each with a titanium,Tie knot. The band was well imbricated. I also placed a anti-slip stitch. There were 1-2 adhesive bands around the band tubing in the mid abdomen. These were lysed with Endo Shears. We visualized the abdominal cavity. There is no evidence of injury in the left upper quadrant where I entered the abdominal cavity. The 11 mm trocar was removed and the fascia was closed with an interrupted 0 Vicryl with a suture passer. Additional local was placed in this region. Pneumoperitoneum was released and the remaining trochars were removed. All skin incisions were closed with a 4-0 Monocryl in a subcuticular fashion followed by Dermabond. All needle, instrument, and sponge counts were correct  x2. There were no immediate complications. The patient was extubated and taken to the recovery room in stable condition.  PLAN OF CARE: Admit to inpatient   PATIENT DISPOSITION:  PACU - hemodynamically stable.   Delay start of Pharmacological VTE agent (>24hrs) due to surgical blood loss or risk of bleeding:  no  Mary Sella. Andrey Campanile, MD, FACS General, Bariatric, & Minimally Invasive Surgery [facility], [place]

## 2012-12-26 NOTE — Addendum Note (Signed)
Addendum created 12/26/12 1746 by Illene Silver, CRNA   Modules edited: Anesthesia Medication Administration

## 2012-12-27 ENCOUNTER — Inpatient Hospital Stay (HOSPITAL_COMMUNITY): Payer: BC Managed Care – PPO

## 2012-12-27 LAB — URINALYSIS, ROUTINE W REFLEX MICROSCOPIC
Bilirubin Urine: NEGATIVE
Glucose, UA: NEGATIVE mg/dL
Ketones, ur: >80 mg/dL — AB
Protein, ur: NEGATIVE mg/dL
pH: 6 (ref 5.0–8.0)

## 2012-12-27 MED ORDER — OXYCODONE-ACETAMINOPHEN 5-325 MG/5ML PO SOLN: 5.0000 mL | ORAL | Status: DC | PRN

## 2012-12-27 MED ORDER — IOHEXOL 300 MG/ML  SOLN
50.0000 mL | Freq: Once | INTRAMUSCULAR | Status: AC | PRN
  Administered 2012-12-27: 50 mL via ORAL

## 2012-12-27 MED ORDER — FLUCONAZOLE 150 MG PO TABS
150.0000 mg | ORAL_TABLET | Freq: Once | ORAL | Status: AC
  Administered 2012-12-27: 150 mg via ORAL
  Filled 2012-12-27: qty 1

## 2012-12-27 NOTE — Discharge Summary (Signed)
Physician Discharge Summary  Patient ID: Kristen [name] MRN: [id number] DOB/AGE: Jun 07, 1980 31 y.o.  Admit date: 12/25/2012 Discharge date: 12/27/2012  Admission Diagnoses:  Anterior slip of lapband and successful weight loss  Discharge Diagnoses:  Same; post laparoscopic reduction of slip and replication  Active Problems:   * No active hospital problems. *   Surgery:  Laparoscopic takedown of anterior slip with re siting and replication  Discharged Condition: improved  Hospital Course:   Admitted and rehydrated.  Taken to surgey on Saturday.  UGI on Sunday which looked OK.  Tolerating liquids.  Ready for discharge later today.  Symptoms suggested possible vaginitis and Diflucan given.  Patient also has underlying kidney stones.  Followup with Dr. Andrey Campanile.   Consults: none  Significant Diagnostic Studies: UGI    Discharge Exam: Blood pressure 97/62, pulse 50, temperature 97.7 F (36.5 C), temperature source Oral, resp. rate 15, height 5\' 6"  (1.676 m), weight 153 lb 14.1 oz (69.8 kg), SpO2 100.00%. Has incisional soreness and discomfort in her shoulders.    Disposition: 01-Home or Self Care  Discharge Orders   Future Appointments Provider Department Dept Phone   01/28/2013 11:30 AM Atilano Ina, MD [facility], [place] [phone]   Future Orders Complete By Expires     Diet - low sodium heart healthy  As directed     Discharge instructions  As directed     Comments:      May shower Follow diet as per first lapband surgery..stay on protein shakes for a week and when advance diet slowly    Increase activity slowly  As directed     No wound care  As directed         Medication List         amitriptyline 25 MG tablet  Commonly known as:  ELAVIL  Take 50 mg by mouth at bedtime.     CALCIUM 500 +D 500-400 MG-UNIT Tabs  Generic drug:  Calcium Carb-Cholecalciferol  Take 1 tablet by mouth daily.     esomeprazole 40 MG packet  Commonly known as:  NEXIUM  Take 40  mg by mouth daily before breakfast.     levonorgestrel 20 MCG/24HR IUD  Commonly known as:  MIRENA  1 each by Intrauterine route once.     multivitamin with minerals tablet  Take 1 tablet by mouth daily.     oxyCODONE-acetaminophen 5-325 MG/5ML solution  Commonly known as:  ROXICET  Take 5-10 mLs by mouth every 4 (four) hours as needed for pain.     oxyCODONE-acetaminophen 5-325 MG/5ML solution  Commonly known as:  ROXICET  Take 5-10 mLs by mouth every 4 (four) hours as needed.           Follow-up Information   Follow up with Atilano Ina, MD. Schedule an appointment as soon as possible for a visit in 2 weeks.   Contact information:   [address] [place] [address] [phone]       Signed: Valarie Merino 12/27/2012, 9:30 AM

## 2012-12-27 NOTE — Progress Notes (Signed)
Pt stable, scripts, and d/c instructions given with no questions/concerns voiced by pt or husband.  Pt transported via wheelchair to private vehicle with NT and husband.

## 2012-12-28 ENCOUNTER — Encounter (HOSPITAL_COMMUNITY): Payer: Self-pay | Admitting: General Surgery

## 2012-12-28 ENCOUNTER — Telehealth (HOSPITAL_COMMUNITY): Payer: Self-pay

## 2012-12-28 NOTE — Progress Notes (Signed)
Utilization review completed.  

## 2012-12-28 NOTE — Telephone Encounter (Signed)
Called patient to check on status post dc.  Patient had lap band slip and second surgery 12/26/12, MD requested followup from Bariatric Nurse Coordinator and Nutrition to ensure patient has good understand of diet for next few weeks.  Patient did not answer.  Left message for patient to return call.

## 2012-12-30 ENCOUNTER — Encounter (INDEPENDENT_AMBULATORY_CARE_PROVIDER_SITE_OTHER): Payer: Self-pay | Admitting: *Deleted

## 2012-12-30 ENCOUNTER — Telehealth (INDEPENDENT_AMBULATORY_CARE_PROVIDER_SITE_OTHER): Payer: Self-pay | Admitting: *Deleted

## 2012-12-30 NOTE — Telephone Encounter (Signed)
Patient called to ask for her return to work note be extended to 01/11/13 which it appears Dr. Andrey Campanile was agreeable with.  New letter being done and mailed to patient today per her request.

## 2012-12-31 ENCOUNTER — Encounter (INDEPENDENT_AMBULATORY_CARE_PROVIDER_SITE_OTHER): Payer: Self-pay | Admitting: General Surgery

## 2013-01-01 ENCOUNTER — Telehealth (INDEPENDENT_AMBULATORY_CARE_PROVIDER_SITE_OTHER): Payer: Self-pay | Admitting: General Surgery

## 2013-01-01 NOTE — Telephone Encounter (Signed)
-----   Message ----- From: Lanell Persons Sent: 12/31/2012 9:05 PM To: [facility] Clinical Pool Subject: Visit Follow-Up Question Dr. Andrey Campanile, I trust your mini vacay with the family was restful.... sorry to bombard you with a question upon your return. Since the surgery on Saturday I have felt the "port" has been turning. I understand that the port wasn't adjusted during the surgery but feel and am noticing that it is protruding more than usual. If looking at my stomach then left side of the port is sticking out much more than usual. I am trying to "smooth" it back down but it continues to protrude more to one side than the other. Should this be a concern OR is this due to the surgery and may go back down once the swelling heals? Thanks for any advice you may bestow. Harvin Hazel Foley [phone]

## 2013-01-01 NOTE — Telephone Encounter (Signed)
LMOM [id number]@3 :25 informing patient of EW verbal orders to the question from her email...he stated that it wasn't anything to worry about and that is was probably from losing weight due to the slip before surgery being dehydrated and unable to keep anything down...told patient that she could call me back should she have anymore questions or concerns

## 2013-01-06 ENCOUNTER — Telehealth (INDEPENDENT_AMBULATORY_CARE_PROVIDER_SITE_OTHER): Payer: Self-pay | Admitting: General Surgery

## 2013-01-06 NOTE — Telephone Encounter (Signed)
Message copied by June Leap on Wed Jan 06, 2013  3:16 PM ------      Message from: Gaynelle Adu M      Created: Sat Dec 26, 2012 11:15 AM       Lawson Fiscal,            Ms foley did well. However if she needs more than 1 week off from work- i'm fine with giving her more time.             wilson ------

## 2013-01-28 ENCOUNTER — Encounter (INDEPENDENT_AMBULATORY_CARE_PROVIDER_SITE_OTHER): Payer: Self-pay | Admitting: General Surgery

## 2013-01-28 ENCOUNTER — Ambulatory Visit (INDEPENDENT_AMBULATORY_CARE_PROVIDER_SITE_OTHER): Payer: BC Managed Care – PPO | Admitting: General Surgery

## 2013-01-28 VITALS — BP 110/64 | HR 72 | Resp 14 | Ht 66.0 in | Wt 156.8 lb

## 2013-01-28 DIAGNOSIS — Z09 Encounter for follow-up examination after completed treatment for conditions other than malignant neoplasm: Secondary | ICD-10-CM

## 2013-01-28 NOTE — Patient Instructions (Signed)
1. Stay on liquids for the next 1- 2 days as you adapt to your new fill volume.  Then resume your previous diet. 2. Decreasing your carbohydrate intake will hasten you weight loss.  Rely more on proteins for your meals.  Avoid condiments that contain sweets such as Honey Mustard and sugary salad dressings.   3. Stay in the "green zone".  If you are regurgitating with meals, having night time reflux, and find yourself eating soft comfort foods (mashed potatoes, potato chips)...realize that you are developing "maladaptive eating".  You will not lose weight this way and may regain weight.  The GREEN ZONE is eating smaller portions and not regurgitating.  Hence we may need to withdraw fluid from your band. 4. Build exercise into your daily routine.  Walking is the best way to start but do something every day if you can.    Eating techniques 20-20-20 (30-30-30) 20 chews, 20 seconds between bites of food, 20 minutes to eat; sometimes you may need 30 chews, 30 seconds etc Use your nondominant hand to eat with Use a child/infant size utensil Try not to eat while watching TV   

## 2013-01-28 NOTE — Progress Notes (Signed)
Subjective:     Patient ID: Kristen [name], female   DOB: Jul 31, 1980, 32 y.o.   MRN: [id number]  HPI 32 year old female comes in today for her first postoperative appointment after undergoing laparoscopic revision for her slipped lap adjustable gastric band on August 8. She states that she has been doing well. However she states that she has had no restriction. She is able to eat large portions of food. She denies any regurgitation, reflux, nausea, vomiting. She denies any diarrhea or constipation. She states that she reverted to eating breads over the past few weeks. She states that she is still exercising regularly. She can eat large portions of food.  Review of Systems     Objective:   Physical Exam BP 110/64  Pulse 72  Resp 14  Ht 5\' 6"  (1.676 m)  Wt 156 lb 12.8 oz (71.124 kg)  BMI 25.32 kg/m2  See LapBand flowsheet  Gen: alert, NAD, non-toxic appearing HEENT: normocephalic, atraumatic; pupils equal, no scleral icterus, Pulm: Lungs clear to auscultation, symmetric chest rise CV: regular rate and rhythm Abd: soft, nontender, nondistended. Well-healed trocar sites. No incisional hernia. Port is in right mid-abdomen Ext: no edema, normal,  Neuro: nonfocal,  Psych: appropriate, judgment normal     Assessment:     Status post laparoscopic revision for a slipped laparoscopic adjustable gastric band 12/25/2012     Plan:     It appears that she is healed from her revisional surgery. She has gained 12 pounds since she was last seen in the office on August 8. I recommended an adjustment since she has large amounts of hunger and large portion sizes  After obtaining verbal consent, the abdominal wall was prepped with Chloraprep. The port was accessed with a Huber needle and 1.5 cc of saline was added to give the patient an expected fill volume of 2.8 cc.  The patient was able to tolerate sips of water.  She was instructed to stay on a liquid diet for at least 24 hours. We rediscussed  proper eating techniques and behaviors. She was instructed on what to call for. She was reminded to get a regular exercise. Followup in 4 weeks  Mary Sella. Andrey Campanile, MD, FACS General, Bariatric, & Minimally Invasive Surgery [facility], [place]

## 2013-02-17 ENCOUNTER — Encounter (INDEPENDENT_AMBULATORY_CARE_PROVIDER_SITE_OTHER): Payer: BC Managed Care – PPO | Admitting: General Surgery

## 2013-02-25 ENCOUNTER — Encounter (INDEPENDENT_AMBULATORY_CARE_PROVIDER_SITE_OTHER): Payer: Self-pay | Admitting: General Surgery

## 2013-02-25 ENCOUNTER — Ambulatory Visit (INDEPENDENT_AMBULATORY_CARE_PROVIDER_SITE_OTHER): Payer: BC Managed Care – PPO | Admitting: General Surgery

## 2013-02-25 VITALS — BP 106/72 | HR 64 | Temp 97.4°F | Resp 16 | Ht 66.0 in | Wt 168.6 lb

## 2013-02-25 DIAGNOSIS — Z9884 Bariatric surgery status: Secondary | ICD-10-CM

## 2013-02-25 NOTE — Patient Instructions (Signed)
1. Stay on liquids for the next 2 days as you adapt to your new fill volume.  Then resume your previous diet. 2. Decreasing your carbohydrate intake will hasten you weight loss.  Rely more on proteins for your meals.  Avoid condiments that contain sweets such as Honey Mustard and sugary salad dressings.   3. Stay in the "green zone".  If you are regurgitating with meals, having night time reflux, and find yourself eating soft comfort foods (mashed potatoes, potato chips)...realize that you are developing "maladaptive eating".  You will not lose weight this way and may regain weight.  The GREEN ZONE is eating smaller portions and not regurgitating.  Hence we may need to withdraw fluid from your band. 4. Build exercise into your daily routine.  Walking is the best way to start but do something every day if you can.    Eating techniques 20-20-20 (30-30-30) 20 chews, 20 seconds between bites of food, 20 minutes to eat; sometimes you may need 30 chews, 30 seconds etc Use your nondominant hand to eat with Use a child/infant size utensil Try not to eat while watching TV  

## 2013-02-25 NOTE — Progress Notes (Signed)
Subjective:     Patient ID: Kristen [name], female   DOB: January 23, 1981, 32 y.o.   MRN: [id number]  HPI 32 year old Caucasian female comes in today for her second postoperative appointment after undergoing revision for a slipped lap band on August 9. I last saw her in the office on September 11. At that time she gained about 12 pounds since surgery. We put fluid back in her band. However she states that she is still able to eat large portions of food. She also states that she is constantly hungry. She denies any fever, chills, nausea, vomiting, regurgitation or reflux. She denies any nighttime cough. She states that she is still exercising on a very frequent basis. She is doing yoga 2 times a week and playing softball 3 times a week. She is also walking every day at the end of work.  Review of Systems     Objective:   Physical Exam BP 106/72  Pulse 64  Temp(Src) 97.4 F (36.3 C) (Temporal)  Resp 16  Ht 5\' 6"  (1.676 m)  Wt 168 lb 9.6 oz (76.476 kg)  BMI 27.23 kg/m2  See LapBand flowsheet  Gen: alert, NAD, non-toxic appearing HEENT: normocephalic, atraumatic; pupils equal, no scleral icterus, neck supple, no lymphadenopathy Pulm: Lungs clear to auscultation, symmetric chest rise CV: regular rate and rhythm Abd: soft, nontender, nondistended. Well-healed trocar sites. No incisional hernia. Port is in right mid-abdomen Ext: no edema, normal, symmetric strength Neuro: nonfocal, sensation grossly intact Psych: appropriate, judgment normal     Assessment:     Status post revision for a slipped LapBand     Plan:     Appears that she still doesn't have adequate restriction. I recommended an adjustment.  After obtaining verbal consent, the abdominal wall was prepped with Chloraprep. The port was accessed with a Huber needle and 2 cc of saline was added to give the patient an expected fill volume of 4.8 cc.  The patient was able to tolerate sips of water.   She was instructed to stay on  liquids for at least 24 hour's. We rediscussed proper eating techniques and behaviors. I encouraged her to continue with the exercise that she is getting. Followup 3-4 week  Mary Sella. Andrey Campanile, MD, FACS General, Bariatric, & Minimally Invasive Surgery [facility], [place]

## 2013-03-19 ENCOUNTER — Ambulatory Visit (INDEPENDENT_AMBULATORY_CARE_PROVIDER_SITE_OTHER): Payer: BC Managed Care – PPO | Admitting: General Surgery

## 2013-03-19 ENCOUNTER — Encounter (INDEPENDENT_AMBULATORY_CARE_PROVIDER_SITE_OTHER): Payer: Self-pay | Admitting: General Surgery

## 2013-03-19 VITALS — BP 110/68 | HR 64 | Temp 97.2°F | Resp 14 | Ht 66.0 in | Wt 168.8 lb

## 2013-03-19 DIAGNOSIS — Z9884 Bariatric surgery status: Secondary | ICD-10-CM

## 2013-03-19 DIAGNOSIS — E663 Overweight: Secondary | ICD-10-CM

## 2013-03-19 NOTE — Progress Notes (Signed)
Subjective:     Patient ID: Kristen [name], female   DOB: Sep 10, 1980, 32 y.o.   MRN: [id number]  HPI 32 year old Caucasian female comes in today for her third postoperative appointment after undergoing revision for a slipped lap band on August 9. I last saw her in the office in early October. We have been adding back fluid since surgery.  However she states that she is still able to eat large portions of food. She also states that she is constantly hungry. She denies any fever, chills, nausea, vomiting, regurgitation or reflux. She denies any nighttime cough. She states that she is still exercising on a very frequent basis. She is doing yoga 2 times a week and recently rejoined a gym since softball is finishing. She is also walking every day at the end of work.  Review of Systems     Objective:   Physical Exam BP 110/68  Pulse 64  Temp(Src) 97.2 F (36.2 C) (Temporal)  Resp 14  Ht 5\' 6"  (1.676 m)  Wt 168 lb 12.8 oz (76.567 kg)  BMI 27.26 kg/m2  See LapBand flowsheet  Gen: alert, NAD, non-toxic appearing HEENT: normocephalic, atraumatic; pupils equal, no scleral icterus, neck supple, no lymphadenopathy Pulm: Lungs clear to auscultation, symmetric chest rise CV: regular rate and rhythm Abd: soft, nontender, nondistended. Well-healed trocar sites. No incisional hernia. Port is in right mid-abdomen Ext: no edema, normal, symmetric strength Neuro: nonfocal, sensation grossly intact Psych: appropriate, judgment normal     Assessment:     Status post revision for a slipped LapBand  Overweight     Plan:     Appears that she still doesn't have adequate restriction. I recommended an adjustment.  After obtaining verbal consent, the abdominal wall was prepped with Chloraprep. The port was accessed with a Huber needle and 0.5 cc of saline was added to give the patient an expected fill volume of 5.3 cc.  The patient was able to tolerate sips of water.   She was instructed to stay on  liquids for at least 24 hour's. We rediscussed proper eating techniques and behaviors. I encouraged her to continue with the exercise that she is getting. Followup 3-4 week  Mary Sella. Andrey Campanile, MD, FACS General, Bariatric, & Minimally Invasive Surgery [facility], [place]

## 2013-03-19 NOTE — Patient Instructions (Signed)
1. Stay on liquids for the next 2 days as you adapt to your new fill volume.  Then resume your previous diet. 2. Decreasing your carbohydrate intake will hasten you weight loss.  Rely more on proteins for your meals.  Avoid condiments that contain sweets such as Honey Mustard and sugary salad dressings.   3. Stay in the "green zone".  If you are regurgitating with meals, having night time reflux, and find yourself eating soft comfort foods (mashed potatoes, potato chips)...realize that you are developing "maladaptive eating".  You will not lose weight this way and may regain weight.  The GREEN ZONE is eating smaller portions and not regurgitating.  Hence we may need to withdraw fluid from your band. 4. Build exercise into your daily routine.  Walking is the best way to start but do something every day if you can.    Eating techniques 20-20-20 (30-30-30) 20 chews, 20 seconds between bites of food, 20 minutes to eat; sometimes you may need 30 chews, 30 seconds etc Use your nondominant hand to eat with Use a child/infant size utensil Try not to eat while watching TV  

## 2013-03-25 ENCOUNTER — Other Ambulatory Visit: Payer: Self-pay

## 2013-03-26 ENCOUNTER — Other Ambulatory Visit (INDEPENDENT_AMBULATORY_CARE_PROVIDER_SITE_OTHER): Payer: Self-pay | Admitting: General Surgery

## 2013-03-26 NOTE — Telephone Encounter (Signed)
ok 

## 2013-03-26 NOTE — Telephone Encounter (Signed)
Please advise if okay to refill. 

## 2013-04-14 ENCOUNTER — Ambulatory Visit (INDEPENDENT_AMBULATORY_CARE_PROVIDER_SITE_OTHER): Payer: BC Managed Care – PPO | Admitting: General Surgery

## 2013-04-14 ENCOUNTER — Encounter (INDEPENDENT_AMBULATORY_CARE_PROVIDER_SITE_OTHER): Payer: Self-pay | Admitting: General Surgery

## 2013-04-14 VITALS — BP 104/66 | HR 72 | Temp 98.1°F | Resp 14 | Ht 66.0 in | Wt 175.6 lb

## 2013-04-14 DIAGNOSIS — Z6825 Body mass index (BMI) 25.0-25.9, adult: Secondary | ICD-10-CM

## 2013-04-14 DIAGNOSIS — E663 Overweight: Secondary | ICD-10-CM | POA: Insufficient documentation

## 2013-04-14 DIAGNOSIS — Z4651 Encounter for fitting and adjustment of gastric lap band: Secondary | ICD-10-CM

## 2013-04-14 DIAGNOSIS — E669 Obesity, unspecified: Secondary | ICD-10-CM | POA: Insufficient documentation

## 2013-04-14 NOTE — Progress Notes (Signed)
Subjective:     Patient ID: Kristen [name], female   DOB: 07-Oct-1980, 32 y.o.   MRN: [id number]  HPI 32 year old Caucasian female comes in for long-term followup after undergoing laparoscopic adjustable gastric band procedure in August 2012. She underwent revision for a slipped band on August 9. Since that time we have been slowly adding fluid Back to her band to get her back to the green zone.She was last seen on October 31. Since then she states that she can still be large portions of food if she eats slowly. She denies any regurgitation, reflux or nighttime cough. She is going to the gym regularly. She denies any abdominal pain.  PMHx, PSHx, SOCHx, FAMHx, ALL reviewed and unchanged  Review of Systems 10 point review of systems is negative except for what is mentioned in the history of present illness    Objective:   Physical Exam BP 104/66  Pulse 72  Temp(Src) 98.1 F (36.7 C) (Temporal)  Resp 14  Ht 5\' 6"  (1.676 m)  Wt 175 lb 9.6 oz (79.652 kg)  BMI 28.36 kg/m2  See LapBand flowsheet  Gen: alert, NAD, non-toxic appearing HEENT: normocephalic, atraumatic; pupils equal, no scleral icterus,  Pulm: Lungs clear to auscultation, symmetric chest rise CV: regular rate and rhythm Abd: soft, nontender, nondistended. Well-healed trocar sites. No incisional hernia. Port is in right mid-abdomen Ext: no edema, normal, Neuro: nonfocal,  Psych: appropriate, judgment normal     Assessment:     Status post revision of slipped lap band Overweight     Plan:     She continues to gain some weight. Her weight is up 6.8 pounds since October 31. We discussed the importance of routine regular exercise. Nonetheless with her increased portion sizes and hunger I recommended an adjustment.  After obtaining verbal consent, the abdominal wall was prepped with Chloraprep. The port was accessed with a Huber needle and completely emptied. There was only about 4.2 cc of fluid in her band. Based on my  previous calculations I thought her fluid status in her band should be around 5.3cc of saline. I retested the volume of fluid in the band and it was only around 4.2 cc. I added 1 cc of saline to her band to give her an expected fill volume of 5.2 cc.  The patient was able to tolerate sips of water.  We rediscussed proper eating techniques and behaviors. Followup for 4 weeks  Mary Sella. Andrey Campanile, MD, FACS General, Bariatric, & Minimally Invasive Surgery [facility], [place]

## 2013-04-14 NOTE — Patient Instructions (Signed)
1. Stay on liquids for the next 2 days as you adapt to your new fill volume.  Then resume your previous diet. 2. Decreasing your carbohydrate intake will hasten you weight loss.  Rely more on proteins for your meals.  Avoid condiments that contain sweets such as Honey Mustard and sugary salad dressings.   3. Stay in the "green zone".  If you are regurgitating with meals, having night time reflux, and find yourself eating soft comfort foods (mashed potatoes, potato chips)...realize that you are developing "maladaptive eating".  You will not lose weight this way and may regain weight.  The GREEN ZONE is eating smaller portions and not regurgitating.  Hence we may need to withdraw fluid from your band. 4. Build exercise into your daily routine.  Walking is the best way to start but do something every day if you can.    Eating techniques 20-20-20 (30-30-30) 20 chews, 20 seconds between bites of food, 20 minutes to eat; sometimes you may need 30 chews, 30 seconds etc Use your nondominant hand to eat with Use a child/infant size utensil Try not to eat while watching TV  

## 2013-05-06 ENCOUNTER — Ambulatory Visit (INDEPENDENT_AMBULATORY_CARE_PROVIDER_SITE_OTHER): Payer: BC Managed Care – PPO | Admitting: General Surgery

## 2013-05-06 ENCOUNTER — Encounter (INDEPENDENT_AMBULATORY_CARE_PROVIDER_SITE_OTHER): Payer: Self-pay | Admitting: General Surgery

## 2013-05-06 VITALS — BP 108/72 | HR 84 | Temp 97.4°F | Resp 16 | Ht 66.0 in | Wt 181.0 lb

## 2013-05-06 DIAGNOSIS — Z4651 Encounter for fitting and adjustment of gastric lap band: Secondary | ICD-10-CM

## 2013-05-06 DIAGNOSIS — E663 Overweight: Secondary | ICD-10-CM

## 2013-05-06 NOTE — Patient Instructions (Signed)
1. Stay on liquids for the next 2 days as you adapt to your new fill volume.  Then resume your previous diet. 2. Decreasing your carbohydrate intake will hasten you weight loss.  Rely more on proteins for your meals.  Avoid condiments that contain sweets such as Honey Mustard and sugary salad dressings.   3. Stay in the "green zone".  If you are regurgitating with meals, having night time reflux, and find yourself eating soft comfort foods (mashed potatoes, potato chips)...realize that you are developing "maladaptive eating".  You will not lose weight this way and may regain weight.  The GREEN ZONE is eating smaller portions and not regurgitating.  Hence we may need to withdraw fluid from your band. 4. Build exercise into your daily routine.  Walking is the best way to start but do something every day if you can.    Eating techniques 20-20-20 (30-30-30) 20 chews, 20 seconds between bites of food, 20 minutes to eat; sometimes you may need 30 chews, 30 seconds etc Use your nondominant hand to eat with Use a child/infant size utensil Try not to eat while watching TV  

## 2013-05-06 NOTE — Progress Notes (Signed)
Subjective:     Patient ID: Kristen [name], female   DOB: 07/01/1980, 32 y.o.   MRN: [id number]  HPI 32 year old Caucasian female comes in for long-term followup after undergoing laparoscopic adjustable gastric band placement in 2012. She had developed a slip in August of this year and we have been trying to get her back to the green zone but it has been a long process. I last saw her in the office on November 6 32. At that time we added 1 cc of fluid were band together and expected to volume of 33 cc. She states that she is doing better. She is no longer able to tolerate breads. She denies any heartburn, reflux, nighttime cough, or regurgitation. She denies any abdominal pain. However she states that she is still able to eat large portions of food. She is hungry throughout the daytime. She is still exercising at the gym at least every other day and alternating what she is doing. She states that she still working on making good food choices.  She is concerned she may have a leak in her band  PMHx, PSHx, SOCHx, FAMHx, ALL reviewed and unchanged  Review of Systems 10 point ROS performed and negative except for HPI    Objective:   Physical Exam BP 108/72  Pulse 84  Temp(Src) 97.4 F (36.3 C) (Temporal)  Resp 16  Ht 5\' 6"  (1.676 m)  Wt 181 lb (82.101 kg)  BMI 29.23 kg/m2  See LapBand flowsheet  Gen: alert, NAD, non-toxic appearing HEENT: normocephalic, atraumatic; pupils equal, no scleral icterus, neck supple, no lymphadenopathy Pulm: Lungs clear to auscultation, symmetric chest rise CV: regular rate and rhythm Abd: soft, nontender, nondistended. Well-healed trocar sites. No incisional hernia. Port is in right mid-abdomen Ext: no edema, normal Psych: appropriate, judgment normal     Assessment:     Status post laparoscopic adjustable gastric band placement with subsequent revision for slip Overweight     Plan:     She continues to gain weight. She has gained 5.4 pounds since her  last visit. With her able to large portions of food and having ongoing hunger between meals I recommended adjustment  After obtaining verbal consent, the abdominal wall was prepped with Chloraprep. The port was accessed with a Huber needle And aspirated. I was able to withdraw about 5 cc of saline.and 0.75 cc of saline was added to give the patient an expected fill volume of 5.75 cc.  The patient was able to tolerate sips of water.   She was instructed to stay on liquids for at least 24-36 hours then transitioned to soft foods. She was instructed on what to call for. Hopefully we are getting closer to the green zone. If she has ongoing issues we may need to check an upper GI. Followup 6-8 weeks. We discussed importance of activity and proper food choices  Mary Sella. Andrey Campanile, MD, FACS General, Bariatric, & Minimally Invasive Surgery [facility], [place]

## 2013-05-16 ENCOUNTER — Encounter (INDEPENDENT_AMBULATORY_CARE_PROVIDER_SITE_OTHER): Payer: Self-pay | Admitting: General Surgery

## 2013-06-10 ENCOUNTER — Ambulatory Visit (INDEPENDENT_AMBULATORY_CARE_PROVIDER_SITE_OTHER): Payer: BC Managed Care – PPO | Admitting: General Surgery

## 2013-06-10 ENCOUNTER — Encounter (INDEPENDENT_AMBULATORY_CARE_PROVIDER_SITE_OTHER): Payer: Self-pay | Admitting: General Surgery

## 2013-06-10 VITALS — BP 112/74 | HR 76 | Temp 98.1°F | Resp 14 | Ht 66.0 in | Wt 186.8 lb

## 2013-06-10 DIAGNOSIS — E663 Overweight: Secondary | ICD-10-CM

## 2013-06-10 DIAGNOSIS — Z4651 Encounter for fitting and adjustment of gastric lap band: Secondary | ICD-10-CM

## 2013-06-10 NOTE — Patient Instructions (Signed)
1. Stay on liquids for the next 2 days as you adapt to your new fill volume.  Then resume your previous diet. 2. Decreasing your carbohydrate intake will hasten you weight loss.  Rely more on proteins for your meals.  Avoid condiments that contain sweets such as Honey Mustard and sugary salad dressings.   3. Stay in the "green zone".  If you are regurgitating with meals, having night time reflux, and find yourself eating soft comfort foods (mashed potatoes, potato chips)...realize that you are developing "maladaptive eating".  You will not lose weight this way and may regain weight.  The GREEN ZONE is eating smaller portions and not regurgitating.  Hence we may need to withdraw fluid from your band. 4. Build exercise into your daily routine.  Walking is the best way to start but do something every day if you can.    Eating techniques 20-20-20 (30-30-30) 20 chews, 20 seconds between bites of food, 20 minutes to eat; sometimes you may need 30 chews, 30 seconds etc Use your nondominant hand to eat with Use a child/infant size utensil Put fork down between meals Try not to eat while watching TV

## 2013-06-10 NOTE — Progress Notes (Signed)
Subjective:     Patient ID: Kristen PersonsKelli B [name], female   DOB: 10/19/1980, 33 y.o.   MRN: [id number]  HPI 33 year old Caucasian female comes in for followup after undergoing laparoscopic tesla gastric band revision in August 2014. We have been struggling to get her back to the green zone. She is still able to eat large portion sizes. She is also hungry between meals. She states that she noticed a small improvement after her most recent adjustment on December 18. However she is still a weight large portions of food and is still gaining weight. She is exercising 3 times a week. She also states that she is making excellent food choices. She was at the gym doing some sit-ups and she felt a pop but had no pain or discomfort. She denies any reflux or regurgitation. She denies any nighttime cough.  PMHx, PSHx, SOCHx, FAMHx, ALL reviewed and unchanged  Review of Systems 8 point ROS performed and negative except for below    Objective:   Physical Exam BP 112/74  Pulse 76  Temp(Src) 98.1 F (36.7 C) (Temporal)  Resp 14  Ht 5\' 6"  (1.676 m)  Wt 186 lb 12.8 oz (84.732 kg)  BMI 30.16 kg/m2  See LapBand flowsheet  Gen: alert, NAD, non-toxic appearing HEENT: normocephalic, atraumatic; pupils equal, no scleral icterus, neck supple, no lymphadenopathy Pulm: Lungs clear to auscultation, symmetric chest rise CV: regular rate and rhythm Abd: soft, nontender, nondistended. Well-healed trocar sites. No incisional hernia. Port is in right mid-abdomen Ext: no edema, normal, symmetric strength Neuro: nonfocal, sensation grossly intact Psych: appropriate, judgment normal     Assessment:     Status post laparoscopic adjustable gastric band revision Obesity     Plan:     Her weight now is 186.8. She's gained an additional 5.8 pounds since her last visit about a month ago. Her lowest weight was around 142.   It is possible she may have a leak in her band system. After obtaining verbal consent and prepping her  abdomen with ChloraPrep I was able to withdraw about 5.2 cc of fluid. Technically she is supposed to have around 5.75. I think the small differences minute.  Because she has ongoing hunger and able to eat large portions I recommended adjustment. 0.5 cc of fluid was added to give her an expected fill volume was 6.25 cc. She was reminded to stay on liquids for the next 24-36 hours. She states that she can tell a difference from drinking the water.  I instructed her to call the office within the next 2 weeks to let us know how she's doing. If she still able a large portion sizes I think the safest thing to do is to get an upper GI.  Mary SellaEric M. Andrey CampanileWilson, MD, FACS General, Bariatric, & Minimally Invasive Surgery [facility] [place]

## 2013-06-15 ENCOUNTER — Encounter (INDEPENDENT_AMBULATORY_CARE_PROVIDER_SITE_OTHER): Payer: Self-pay | Admitting: General Surgery

## 2013-06-18 ENCOUNTER — Other Ambulatory Visit (INDEPENDENT_AMBULATORY_CARE_PROVIDER_SITE_OTHER): Payer: Self-pay | Admitting: General Surgery

## 2013-06-18 ENCOUNTER — Telehealth (INDEPENDENT_AMBULATORY_CARE_PROVIDER_SITE_OTHER): Payer: Self-pay | Admitting: General Surgery

## 2013-06-18 NOTE — Telephone Encounter (Addendum)
Called [facility] and refill her potonix 40 mg and with 2 more refills on her meds

## 2013-06-18 NOTE — Telephone Encounter (Signed)
Can this patient have this Rx 

## 2013-06-18 NOTE — Telephone Encounter (Signed)
Yes 2 refills

## 2013-06-21 ENCOUNTER — Other Ambulatory Visit (INDEPENDENT_AMBULATORY_CARE_PROVIDER_SITE_OTHER): Payer: Self-pay | Admitting: *Deleted

## 2013-06-21 ENCOUNTER — Telehealth (INDEPENDENT_AMBULATORY_CARE_PROVIDER_SITE_OTHER): Payer: Self-pay | Admitting: *Deleted

## 2013-06-21 DIAGNOSIS — Z9884 Bariatric surgery status: Secondary | ICD-10-CM

## 2013-06-21 NOTE — Telephone Encounter (Signed)
Pt returned call and appt information below was provided.  She is agreeable with this appt.

## 2013-06-21 NOTE — Telephone Encounter (Signed)
LMOM for pt to return my call.  I was calling to notify pt of her appt for the [facility]GI at [facility] on 06/24/13 with an arrival time of 9:45am.  Pt is to be NPO after midnight.  Please advise pt of her appt with Mardelle MatteAndy on 07/08/13 at 3:30pm.

## 2013-06-24 ENCOUNTER — Ambulatory Visit
Admission: RE | Admit: 2013-06-24 | Discharge: 2013-06-24 | Disposition: A | Discharge status: Home / Self Care | Payer: BC Managed Care – PPO | Source: Ambulatory Visit | Attending: General Surgery | Admitting: General Surgery

## 2013-06-24 ENCOUNTER — Other Ambulatory Visit (INDEPENDENT_AMBULATORY_CARE_PROVIDER_SITE_OTHER): Payer: Self-pay | Admitting: General Surgery

## 2013-06-24 ENCOUNTER — Encounter (INDEPENDENT_AMBULATORY_CARE_PROVIDER_SITE_OTHER): Payer: Self-pay | Admitting: General Surgery

## 2013-06-24 DIAGNOSIS — Z9884 Bariatric surgery status: Secondary | ICD-10-CM

## 2013-06-25 ENCOUNTER — Telehealth (INDEPENDENT_AMBULATORY_CARE_PROVIDER_SITE_OTHER): Payer: Self-pay | Admitting: General Surgery

## 2013-06-25 ENCOUNTER — Encounter (INDEPENDENT_AMBULATORY_CARE_PROVIDER_SITE_OTHER): Payer: Self-pay | Admitting: General Surgery

## 2013-06-25 NOTE — Telephone Encounter (Signed)
Called patient to discuss upper GI results. Informed her on voice mail I would send her an Epic message

## 2013-07-08 ENCOUNTER — Ambulatory Visit (INDEPENDENT_AMBULATORY_CARE_PROVIDER_SITE_OTHER): Payer: BC Managed Care – PPO | Admitting: Physician Assistant

## 2013-07-08 ENCOUNTER — Encounter (INDEPENDENT_AMBULATORY_CARE_PROVIDER_SITE_OTHER): Payer: Self-pay

## 2013-07-08 VITALS — BP 118/76 | HR 64 | Temp 98.0°F | Resp 14 | Ht 66.0 in | Wt 192.4 lb

## 2013-07-08 DIAGNOSIS — Z4651 Encounter for fitting and adjustment of gastric lap band: Secondary | ICD-10-CM

## 2013-07-08 NOTE — Patient Instructions (Signed)

## 2013-07-08 NOTE — Progress Notes (Signed)
  HISTORY: Kristen PersonsKelli B [name] is a 33 y.o.female who received an AP-Standard lap-band in August 2012 with a revision in August 2014 by Dr. Andrey CampanileWilson. She comes in with close to 6 lbs weight gain since her last appointment. She has felt very little change since that last fill. She has no regurgitation or reflux symptoms but her hunger and portion sizes remain significant.  VITAL SIGNS: Filed Vitals:   07/08/13 1512  BP: 118/76  Pulse: 64  Temp: 98 F (36.7 C)  Resp: 14    PHYSICAL EXAM: Physical exam reveals a very well-appearing 33 y.o.female in no apparent distress Neurologic: Awake, alert, oriented Psych: Bright affect, conversant Respiratory: Breathing even and unlabored. No stridor or wheezing Abdomen: Soft, nontender, nondistended to palpation. Incisions well-healed. No incisional hernias. Port easily palpated. Extremities: Atraumatic, good range of motion.  ASSESMENT: 33 y.o.  female  s/p AP-Standard lap-band.   PLAN: The patient's port was accessed with a 20G Huber needle without difficulty. Clear fluid was aspirated and 0.5 mL saline was added to the port to give a total predicted volume of 6.75 mL. The patient was able to swallow water without difficulty following the procedure and was instructed to take clear liquids for the next 24-48 hours and advance slowly as tolerated. We discussed her upper GI results. She has no evidence of a recurrent slip either by clinical or radiographic signs.

## 2013-07-13 ENCOUNTER — Encounter (INDEPENDENT_AMBULATORY_CARE_PROVIDER_SITE_OTHER): Payer: BC Managed Care – PPO | Admitting: General Surgery

## 2013-07-29 ENCOUNTER — Encounter (INDEPENDENT_AMBULATORY_CARE_PROVIDER_SITE_OTHER): Payer: BC Managed Care – PPO | Admitting: General Surgery

## 2013-08-12 ENCOUNTER — Ambulatory Visit (INDEPENDENT_AMBULATORY_CARE_PROVIDER_SITE_OTHER): Payer: BC Managed Care – PPO | Admitting: Physician Assistant

## 2013-08-12 ENCOUNTER — Encounter (INDEPENDENT_AMBULATORY_CARE_PROVIDER_SITE_OTHER): Payer: Self-pay

## 2013-08-12 VITALS — BP 118/70 | HR 80 | Temp 98.6°F | Resp 14 | Ht 66.0 in | Wt 186.2 lb

## 2013-08-12 DIAGNOSIS — Z9884 Bariatric surgery status: Secondary | ICD-10-CM

## 2013-08-12 NOTE — Progress Notes (Signed)
  HISTORY: Kristen PersonsKelli B [name] is a 33 y.o.female who received an AP-Standard lap-band in August 2012 by Dr. Andrey CampanileWilson. She comes in today with six lbs weight loss since her last visit. She's lost 99 lbs since surgery. She is concerned that she may have a slip due to a recent GI illness that resulted in some vomiting. She reports being able to eat solids without difficulty. She has no complaints of regurgitation or reflux since her illness resolved. She is happy with her current situation but would certainly like to lose more weight. She is exercising regularly and has started playing softball again.  VITAL SIGNS: Filed Vitals:   08/12/13 0829  BP: 118/70  Pulse: 80  Temp: 98.6 F (37 C)  Resp: 14    PHYSICAL EXAM: Physical exam reveals a very well-appearing 33 y.o.female in no apparent distress Neurologic: Awake, alert, oriented Psych: Bright affect, conversant Respiratory: Breathing even and unlabored. No stridor or wheezing Extremities: Atraumatic, good range of motion. Skin: Warm, Dry, no rashes Musculoskeletal: Normal gait, Joints normal  ASSESMENT: 33 y.o.  female  s/p AP-Standard lap-band.   PLAN: As she's having no obstructive symptoms, my suspicion of a slip is very low. We discussed the option of obtaining a KUB to check band position but as she's doing fine with no dysphagia, we opted to defer the x-ray. We'll have her back in three months unless she needs to be seen sooner.

## 2013-08-12 NOTE — Patient Instructions (Signed)
Return in three months. Focus on good food choices as well as physical activity. Return sooner if you have an increase in hunger, portion sizes or weight. Return also for difficulty swallowing, night cough, reflux.   

## 2013-08-25 ENCOUNTER — Encounter (INDEPENDENT_AMBULATORY_CARE_PROVIDER_SITE_OTHER): Payer: Self-pay | Admitting: General Surgery

## 2013-08-25 ENCOUNTER — Other Ambulatory Visit (INDEPENDENT_AMBULATORY_CARE_PROVIDER_SITE_OTHER): Payer: Self-pay | Admitting: General Surgery

## 2013-08-26 ENCOUNTER — Telehealth (INDEPENDENT_AMBULATORY_CARE_PROVIDER_SITE_OTHER): Payer: Self-pay | Admitting: General Surgery

## 2013-08-26 ENCOUNTER — Ambulatory Visit
Admission: RE | Admit: 2013-08-26 | Discharge: 2013-08-26 | Disposition: A | Discharge status: Home / Self Care | Payer: BC Managed Care – PPO | Source: Ambulatory Visit | Attending: General Surgery | Admitting: General Surgery

## 2013-08-26 ENCOUNTER — Other Ambulatory Visit (INDEPENDENT_AMBULATORY_CARE_PROVIDER_SITE_OTHER): Payer: Self-pay | Admitting: General Surgery

## 2013-08-26 DIAGNOSIS — R1110 Vomiting, unspecified: Secondary | ICD-10-CM

## 2013-08-26 DIAGNOSIS — K9509 Other complications of gastric band procedure: Secondary | ICD-10-CM

## 2013-08-26 NOTE — Telephone Encounter (Signed)
LMOM for patient to call back and ask for Kristen [name] 

## 2013-08-26 NOTE — Telephone Encounter (Signed)
Patient called back and she will walk in today in the [address] 08-26-2013 around lunch time

## 2013-10-28 ENCOUNTER — Other Ambulatory Visit (INDEPENDENT_AMBULATORY_CARE_PROVIDER_SITE_OTHER): Payer: Self-pay | Admitting: General Surgery

## 2013-10-28 ENCOUNTER — Ambulatory Visit (INDEPENDENT_AMBULATORY_CARE_PROVIDER_SITE_OTHER): Payer: BC Managed Care – PPO | Admitting: Physician Assistant

## 2013-10-28 ENCOUNTER — Encounter (INDEPENDENT_AMBULATORY_CARE_PROVIDER_SITE_OTHER): Payer: Self-pay

## 2013-10-28 VITALS — BP 126/78 | HR 75 | Ht 66.0 in | Wt 176.8 lb

## 2013-10-28 DIAGNOSIS — K219 Gastro-esophageal reflux disease without esophagitis: Secondary | ICD-10-CM

## 2013-10-28 DIAGNOSIS — Z4651 Encounter for fitting and adjustment of gastric lap band: Secondary | ICD-10-CM

## 2013-10-28 NOTE — Telephone Encounter (Signed)
Yes 3 refills 

## 2013-10-28 NOTE — Telephone Encounter (Signed)
Can this patient have this Rx 

## 2013-10-28 NOTE — Patient Instructions (Signed)
Return in three months. Focus on good food choices as well as physical activity. Return sooner if you have an increase in hunger, portion sizes or weight. Return also for difficulty swallowing, night cough, reflux.   

## 2013-10-28 NOTE — Progress Notes (Signed)
  HISTORY: Kristen [name] is a 33 y.o.female who received an AP-Standard lap-band in August 2012 with a revision in August 2014 by Dr. Andrey Campanile. She comes in with a three-week history of increasing difficulty swallowing some solids, primarily fish. She does OK with salads. Liquids pose no problem. She recalls no inciting factors. She has lost 10 lbs since her last visit in March when we obtained a KUB to check band position, which was normal. She had suffered a GI illness prior to this and was concerned about slippage. She has lost a total of 108 lbs since surgery.  VITAL SIGNS: Filed Vitals:   10/28/13 1323  BP: 126/78  Pulse: 75    PHYSICAL EXAM: Physical exam reveals a very well-appearing 33 y.o.female in no apparent distress Neurologic: Awake, alert, oriented Psych: Bright affect, conversant Respiratory: Breathing even and unlabored. No stridor or wheezing Abdomen: Soft, nontender, nondistended to palpation. Incisions well-healed. No incisional hernias. Port easily palpated. Extremities: Atraumatic, good range of motion.  ASSESMENT: 33 y.o.  female  s/p AP-Standard lap-band.   PLAN: The patient's port was accessed with a 20G Huber needle without difficulty. Clear fluid was aspirated and 0.25 mL saline was removed from the port to give a total predicted volume of 6.5 mL. The patient was advised to concentrate on healthy food choices and to avoid slider foods high in fats and carbohydrates. I advised her to take liquids for the remainder of the day to help alleviate inflammation then to carefully advance her diet. If today's measures don't alleviate her symptoms, I asked her to return next week.

## 2013-11-11 ENCOUNTER — Encounter (INDEPENDENT_AMBULATORY_CARE_PROVIDER_SITE_OTHER): Payer: BC Managed Care – PPO

## 2014-01-27 ENCOUNTER — Encounter (INDEPENDENT_AMBULATORY_CARE_PROVIDER_SITE_OTHER): Payer: BC Managed Care – PPO

## 2014-07-14 ENCOUNTER — Encounter: Payer: Self-pay | Admitting: Gynecology

## 2014-07-14 ENCOUNTER — Ambulatory Visit (INDEPENDENT_AMBULATORY_CARE_PROVIDER_SITE_OTHER): Payer: BC Managed Care – PPO | Admitting: Gynecology

## 2014-07-14 VITALS — BP 116/74 | Ht 66.0 in | Wt 186.0 lb

## 2014-07-14 DIAGNOSIS — Z01419 Encounter for gynecological examination (general) (routine) without abnormal findings: Secondary | ICD-10-CM

## 2014-07-14 DIAGNOSIS — Z30431 Encounter for routine checking of intrauterine contraceptive device: Secondary | ICD-10-CM

## 2014-07-14 LAB — COMPREHENSIVE METABOLIC PANEL
ALBUMIN: 4.4 g/dL (ref 3.5–5.2)
ALT: 23 U/L (ref 0–35)
AST: 21 U/L (ref 0–37)
Alkaline Phosphatase: 55 U/L (ref 39–117)
BUN: 11 mg/dL (ref 6–23)
CALCIUM: 9.3 mg/dL (ref 8.4–10.5)
CHLORIDE: 103 meq/L (ref 96–112)
CO2: 27 meq/L (ref 19–32)
Creat: 0.63 mg/dL (ref 0.50–1.10)
GLUCOSE: 83 mg/dL (ref 70–99)
POTASSIUM: 4.3 meq/L (ref 3.5–5.3)
SODIUM: 137 meq/L (ref 135–145)
TOTAL PROTEIN: 6.6 g/dL (ref 6.0–8.3)
Total Bilirubin: 0.6 mg/dL (ref 0.2–1.2)

## 2014-07-14 LAB — CBC WITH DIFFERENTIAL/PLATELET
BASOS ABS: 0 10*3/uL (ref 0.0–0.1)
BASOS PCT: 0 % (ref 0–1)
EOS ABS: 0.1 10*3/uL (ref 0.0–0.7)
Eosinophils Relative: 2 % (ref 0–5)
HCT: 40.9 % (ref 36.0–46.0)
Hemoglobin: 13.7 g/dL (ref 12.0–15.0)
Lymphocytes Relative: 48 % — ABNORMAL HIGH (ref 12–46)
Lymphs Abs: 2.3 10*3/uL (ref 0.7–4.0)
MCH: 31.1 pg (ref 26.0–34.0)
MCHC: 33.5 g/dL (ref 30.0–36.0)
MCV: 93 fL (ref 78.0–100.0)
MONOS PCT: 7 % (ref 3–12)
MPV: 8.9 fL (ref 8.6–12.4)
Monocytes Absolute: 0.3 10*3/uL (ref 0.1–1.0)
NEUTROS PCT: 43 % (ref 43–77)
Neutro Abs: 2.1 10*3/uL (ref 1.7–7.7)
PLATELETS: 312 10*3/uL (ref 150–400)
RBC: 4.4 MIL/uL (ref 3.87–5.11)
RDW: 12.6 % (ref 11.5–15.5)
WBC: 4.8 10*3/uL (ref 4.0–10.5)

## 2014-07-14 LAB — LIPID PANEL
CHOL/HDL RATIO: 3.4 ratio
CHOLESTEROL: 193 mg/dL (ref 0–200)
HDL: 56 mg/dL (ref >=46)
LDL Cholesterol: 125 mg/dL — ABNORMAL HIGH (ref 0–99)
TRIGLYCERIDES: 62 mg/dL (ref <150)
VLDL: 12 mg/dL (ref 0–40)

## 2014-07-14 NOTE — Progress Notes (Signed)
Kristen NaegeliKelli B [name] 02/16/1981 [id number]        34 y.o.  G0P0 for annual exam.  Doing well without complaints.  Past medical history,surgical history, problem list, medications, allergies, family history and social history were all reviewed and documented as reviewed in the EPIC chart.  ROS:  Performed with pertinent positives and negatives included in the history, assessment and plan.   Additional significant findings :  none   Exam: [facility] person[facility]istant Filed Vitals:   07/14/14 0928  BP: 116/74  Height: 5\' 6"  (1.676 m)  Weight: 186 lb (84.369 kg)   General appearance:  Normal affect, orientation and appearance. Skin: Grossly normal HEENT: Without gross lesions.  No cervical or supraclavicular adenopathy. Thyroid normal.  Lungs:  Clear without wheezing, rales or rhonchi Cardiac: RR, without RMG Abdominal:  Soft, nontender, without masses, guarding, rebound, organomegaly or hernia Breasts:  Examined lying and sitting without masses, retractions, discharge or axillary adenopathy. Pelvic:  Ext/BUS/vagina normal  Cervix normal. IUD string not visualized  Uterus anteverted, normal size, shape and contour, midline and mobile nontender   Adnexa  Without masses or tenderness    Anus and perineum  Normal   Rectovaginal  Normal sphincter tone without palpated masses or tenderness.    Assessment/Plan:  34 y.o. G0P0 female for annual exam without menses, Mirena IUD.   1. Mirena IUD 01/2010. Due to have replaced this coming September and I reminded her to schedule this appointment.  String not visualized as in the past.  Ultrasound 08/2012 documented intrauterine placement. Having scant to absent menses. Continue to monitor and follow up for replacement appointment. 2. Pap smear/HPV negative 04/2012. No Pap smear done today. No history of significant abnormal Pap smears. Plan repeat Pap smear at 3-5 year interval per current screening guidelines. 3. Breast health. SBE monthly reviewed plan screening  mammography closer to 40. No strong family history of breast cancer. 4. Health maintenance. Baseline CBC, comprehensive metabolic panel, lipid profile, urinalysis ordered. Follow up for IUD replacement in September otherwise 1 year for annual exam.     [name],Kristen Sockwell P MD, 9:58 AM 07/14/2014

## 2014-07-14 NOTE — Patient Instructions (Signed)
Follow up this coming September to have your Mirena IUD replaced.  You may obtain a copy of any labs that were done today by logging onto MyChart as outlined in the instructions provided with your AVS (after visit summary). The office will not call with normal lab results but certainly if there are any significant abnormalities then we will contact you.   Health Maintenance, Female A healthy lifestyle and preventative care can promote health and wellness.  Maintain regular health, dental, and eye exams.  Eat a healthy diet. Foods like vegetables, fruits, whole grains, low-fat dairy products, and lean protein foods contain the nutrients you need without too many calories. Decrease your intake of foods high in solid fats, added sugars, and salt. Get information about a proper diet from your caregiver, if necessary.  Regular physical exercise is one of the most important things you can do for your health. Most adults should get at least 150 minutes of moderate-intensity exercise (any activity that increases your heart rate and causes you to sweat) each week. In addition, most adults need muscle-strengthening exercises on 2 or more days a week.   Maintain a healthy weight. The body mass index (BMI) is a screening tool to identify possible weight problems. It provides an estimate of body fat based on height and weight. Your caregiver can help determine your BMI, and can help you achieve or maintain a healthy weight. For adults 20 years and older:  A BMI below 18.5 is considered underweight.  A BMI of 18.5 to 24.9 is normal.  A BMI of 25 to 29.9 is considered overweight.  A BMI of 30 and above is considered obese.  Maintain normal blood lipids and cholesterol by exercising and minimizing your intake of saturated fat. Eat a balanced diet with plenty of fruits and vegetables. Blood tests for lipids and cholesterol should begin at age 12 and be repeated every 5 years. If your lipid or cholesterol  levels are high, you are over 50, or you are a high risk for heart disease, you may need your cholesterol levels checked more frequently.Ongoing high lipid and cholesterol levels should be treated with medicines if diet and exercise are not effective.  If you smoke, find out from your caregiver how to quit. If you do not use tobacco, do not start.  Lung cancer screening is recommended for adults aged 55 80 years who are at high risk for developing lung cancer because of a history of smoking. Yearly low-dose computed tomography (CT) is recommended for people who have at least a 30-pack-year history of smoking and are a current smoker or have quit within the past 15 years. A pack year of smoking is smoking an average of 1 pack of cigarettes a day for 1 year (for example: 1 pack a day for 30 years or 2 packs a day for 15 years). Yearly screening should continue until the smoker has stopped smoking for at least 15 years. Yearly screening should also be stopped for people who develop a health problem that would prevent them from having lung cancer treatment.  If you are pregnant, do not drink alcohol. If you are breastfeeding, be very cautious about drinking alcohol. If you are not pregnant and choose to drink alcohol, do not exceed 1 drink per day. One drink is considered to be 12 ounces (355 mL) of beer, 5 ounces (148 mL) of wine, or 1.5 ounces (44 mL) of liquor.  Avoid use of street drugs. Do not share needles with  anyone. Ask for help if you need support or instructions about stopping the use of drugs.  High blood pressure causes heart disease and increases the risk of stroke. Blood pressure should be checked at least every 1 to 2 years. Ongoing high blood pressure should be treated with medicines, if weight loss and exercise are not effective.  If you are 55 to 35 years old, ask your caregiver if you should take aspirin to prevent strokes.  Diabetes screening involves taking a blood sample to check  your fasting blood sugar level. This should be done once every 3 years, after age 45, if you are within normal weight and without risk factors for diabetes. Testing should be considered at a younger age or be carried out more frequently if you are overweight and have at least 1 risk factor for diabetes.  Breast cancer screening is essential preventative care for women. You should practice "breast self-awareness." This means understanding the normal appearance and feel of your breasts and may include breast self-examination. Any changes detected, no matter how small, should be reported to a caregiver. Women in their 20s and 30s should have a clinical breast exam (CBE) by a caregiver as part of a regular health exam every 1 to 3 years. After age 40, women should have a CBE every year. Starting at age 40, women should consider having a mammogram (breast X-ray) every year. Women who have a family history of breast cancer should talk to their caregiver about genetic screening. Women at a high risk of breast cancer should talk to their caregiver about having an MRI and a mammogram every year.  Breast cancer gene (BRCA)-related cancer risk assessment is recommended for women who have family members with BRCA-related cancers. BRCA-related cancers include breast, ovarian, tubal, and peritoneal cancers. Having family members with these cancers may be associated with an increased risk for harmful changes (mutations) in the breast cancer genes BRCA1 and BRCA2. Results of the assessment will determine the need for genetic counseling and BRCA1 and BRCA2 testing.  The Pap test is a screening test for cervical cancer. Women should have a Pap test starting at age 21. Between ages 21 and 29, Pap tests should be repeated every 2 years. Beginning at age 30, you should have a Pap test every 3 years as long as the past 3 Pap tests have been normal. If you had a hysterectomy for a problem that was not cancer or a condition that  could lead to cancer, then you no longer need Pap tests. If you are between ages 65 and 70, and you have had normal Pap tests going back 10 years, you no longer need Pap tests. If you have had past treatment for cervical cancer or a condition that could lead to cancer, you need Pap tests and screening for cancer for at least 20 years after your treatment. If Pap tests have been discontinued, risk factors (such as a new sexual partner) need to be reassessed to determine if screening should be resumed. Some women have medical problems that increase the chance of getting cervical cancer. In these cases, your caregiver may recommend more frequent screening and Pap tests.  The human papillomavirus (HPV) test is an additional test that may be used for cervical cancer screening. The HPV test looks for the virus that can cause the cell changes on the cervix. The cells collected during the Pap test can be tested for HPV. The HPV test could be used to screen women aged 30   years and older, and should be used in women of any age who have unclear Pap test results. After the age of 30, women should have HPV testing at the same frequency as a Pap test.  Colorectal cancer can be detected and often prevented. Most routine colorectal cancer screening begins at the age of 50 and continues through age 75. However, your caregiver may recommend screening at an earlier age if you have risk factors for colon cancer. On a yearly basis, your caregiver may provide home test kits to check for hidden blood in the stool. Use of a small camera at the end of a tube, to directly examine the colon (sigmoidoscopy or colonoscopy), can detect the earliest forms of colorectal cancer. Talk to your caregiver about this at age 50, when routine screening begins. Direct examination of the colon should be repeated every 5 to 10 years through age 75, unless early forms of pre-cancerous polyps or small growths are found.  Hepatitis C blood testing is  recommended for all people born from 1945 through 1965 and any individual with known risks for hepatitis C.  Practice safe sex. Use condoms and avoid high-risk sexual practices to reduce the spread of sexually transmitted infections (STIs). Sexually active women aged 25 and younger should be checked for Chlamydia, which is a common sexually transmitted infection. Older women with new or multiple partners should also be tested for Chlamydia. Testing for other STIs is recommended if you are sexually active and at increased risk.  Osteoporosis is a disease in which the bones lose minerals and strength with aging. This can result in serious bone fractures. The risk of osteoporosis can be identified using a bone density scan. Women ages 65 and over and women at risk for fractures or osteoporosis should discuss screening with their caregivers. Ask your caregiver whether you should be taking a calcium supplement or vitamin D to reduce the rate of osteoporosis.  Menopause can be associated with physical symptoms and risks. Hormone replacement therapy is available to decrease symptoms and risks. You should talk to your caregiver about whether hormone replacement therapy is right for you.  Use sunscreen. Apply sunscreen liberally and repeatedly throughout the day. You should seek shade when your shadow is shorter than you. Protect yourself by wearing long sleeves, pants, a wide-brimmed hat, and sunglasses year round, whenever you are outdoors.  Notify your caregiver of new moles or changes in moles, especially if there is a change in shape or color. Also notify your caregiver if a mole is larger than the size of a pencil eraser.  Stay current with your immunizations. Document Released: 11/19/2010 Document Revised: 08/31/2012 Document Reviewed: 11/19/2010 ExitCare Patient Information 2014 [employer].   

## 2014-07-15 LAB — URINALYSIS W MICROSCOPIC + REFLEX CULTURE
Bilirubin Urine: NEGATIVE
Casts: NONE SEEN
Crystals: NONE SEEN
GLUCOSE, UA: NEGATIVE mg/dL
KETONES UR: NEGATIVE mg/dL
LEUKOCYTES UA: NEGATIVE
Nitrite: NEGATIVE
PH: 6.5 (ref 5.0–8.0)
Protein, ur: NEGATIVE mg/dL
Specific Gravity, Urine: 1.021 (ref 1.005–1.030)
Urobilinogen, UA: 1 mg/dL (ref 0.0–1.0)

## 2014-07-19 ENCOUNTER — Other Ambulatory Visit: Payer: Self-pay | Admitting: Gynecology

## 2014-07-19 DIAGNOSIS — E7800 Pure hypercholesterolemia, unspecified: Secondary | ICD-10-CM

## 2014-09-28 ENCOUNTER — Telehealth: Payer: Self-pay | Admitting: Gynecology

## 2014-09-28 NOTE — Telephone Encounter (Signed)
09/28/14-Pt [facility] today [facility] existing [facility]UD and insertion of new Mirena IUD for contraception at 100%, no copay. She already has appt scheduled in September with TF/wl--BC Ref#[id number]

## 2015-01-27 ENCOUNTER — Ambulatory Visit (INDEPENDENT_AMBULATORY_CARE_PROVIDER_SITE_OTHER): Payer: BC Managed Care – PPO | Admitting: Gynecology

## 2015-01-27 ENCOUNTER — Encounter: Payer: Self-pay | Admitting: Gynecology

## 2015-01-27 VITALS — BP 122/74 | Ht 66.0 in | Wt 186.0 lb

## 2015-01-27 DIAGNOSIS — Z30433 Encounter for removal and reinsertion of intrauterine contraceptive device: Secondary | ICD-10-CM | POA: Diagnosis not present

## 2015-01-27 HISTORY — PX: INTRAUTERINE DEVICE INSERTION: SHX323

## 2015-01-27 NOTE — Progress Notes (Signed)
Patient presents for Mirena IUD Removal and replacement. She has read through the booklet, has no contraindications and signed the consent form.  I reviewed the removal and insertional process with her as well as the risks to include infection, either immediate or long-term, uterine perforation or migration requiring surgery to remove, other complications such as pain, hormonal side effects and possibility of failure with subsequent pregnancy.   Exam with Bari Mantis assistant Pelvic: External BUS vagina normal. Cervix normal with IUD string not visualized. Uterus anteverted normal size shape contour midline mobile nontender. Adnexa without masses or tenderness.  Procedure: The cervix was visualized with the speculum and the external os was probed with the [facility] forcep and the Mirena IUD string was grasped and the Mirena IUD was removed, shown to the patient and discarded.  The cervix was then cleansed with Betadine, anterior lip grasped with a single-tooth tenaculum, the uterus was sounded and a Mirena IUD was placed according to manufacturer's recommendations without difficulty. The strings were trimmed. The patient tolerated well and will follow up in one month for a postinsertional check.  Lot number:  [id number]    Dara Lords MD, 4:14 PM 01/27/2015

## 2015-01-27 NOTE — Patient Instructions (Signed)
Intrauterine Device Insertion Most often, an intrauterine device (IUD) is inserted into the uterus to prevent pregnancy. There are 2 types of IUDs available:  Copper IUD--This type of IUD creates an environment that is not favorable to sperm survival. The mechanism of action of the copper IUD is not known for certain. It can stay in place for 10 years.  Hormone IUD--This type of IUD contains the hormone progestin (synthetic progesterone). The progestin thickens the cervical mucus and prevents sperm from entering the uterus, and it also thins the uterine lining. There is no evidence that the hormone IUD prevents implantation. One hormone IUD can stay in place for up to 5 years, and a different hormone IUD can stay in place for up to 3 years. An IUD is the most cost-effective birth control if left in place for the full duration. It may be removed at any time. LET YOUR HEALTH CARE PROVIDER KNOW ABOUT:  Any allergies you have.  All medicines you are taking, including vitamins, herbs, eye drops, creams, and over-the-counter medicines.  Previous problems you or members of your family have had with the use of anesthetics.  Any blood disorders you have.  Previous surgeries you have had.  Possibility of pregnancy.  Medical conditions you have. RISKS AND COMPLICATIONS  Generally, intrauterine device insertion is a safe procedure. However, as with any procedure, complications can occur. Possible complications include:  Accidental puncture (perforation) of the uterus.  Accidental placement of the IUD either in the muscle layer of the uterus (myometrium) or outside the uterus. If this happens, the IUD can be found essentially floating around the bowels and must be taken out surgically.  The IUD may fall out of the uterus (expulsion). This is more common in women who have recently had a child.   Pregnancy in the fallopian tube (ectopic).  Pelvic inflammatory disease (PID), which is infection of  the uterus and fallopian tubes. The risk of PID is slightly increased in the first 20 days after the IUD is placed, but the overall risk is still very low. BEFORE THE PROCEDURE  Schedule the IUD insertion for when you will have your menstrual period or right after, to make sure you are not pregnant. Placement of the IUD is better tolerated shortly after a menstrual cycle.  You may need to take tests or be examined to make sure you are not pregnant.  You may be required to take a pregnancy test.  You may be required to get checked for sexually transmitted infections (STIs) prior to placement. Placing an IUD in someone who has an infection can make the infection worse.  You may be given a pain reliever to take 1 or 2 hours before the procedure.  An exam will be performed to determine the size and position of your uterus.  Ask your health care provider about changing or stopping your regular medicines. PROCEDURE   A tool (speculum) is placed in the vagina. This allows your health care provider to see the lower part of the uterus (cervix).  The cervix is prepped with a medicine that lowers the risk of infection.  You may be given a medicine to numb each side of the cervix (intracervical or paracervical block). This is used to block and control any discomfort with insertion.  A tool (uterine sound) is inserted into the uterus to determine the length of the uterine cavity and the direction the uterus may be tilted.  A slim instrument (IUD inserter) is inserted through the cervical   canal and into your uterus.  The IUD is placed in the uterine cavity and the insertion device is removed.  The nylon string that is attached to the IUD and used for eventual IUD removal is trimmed. It is trimmed so that it lays high in the vagina, just outside the cervix. AFTER THE PROCEDURE  You may have bleeding after the procedure. This is normal. It varies from light spotting for a few days to menstrual-like  bleeding.  You may have mild cramping. Document Released: 01/02/2011 Document Revised: 02/24/2013 Document Reviewed: 10/25/2012 ExitCare Patient Information 2015 [employer]. This information is not intended to replace advice given to you by your health care provider. Make sure you discuss any questions you have with your health care provider.  

## 2015-01-30 ENCOUNTER — Encounter: Payer: Self-pay | Admitting: Gynecology

## 2015-03-03 ENCOUNTER — Ambulatory Visit (INDEPENDENT_AMBULATORY_CARE_PROVIDER_SITE_OTHER): Payer: BC Managed Care – PPO | Admitting: Gynecology

## 2015-03-03 ENCOUNTER — Encounter: Payer: Self-pay | Admitting: Gynecology

## 2015-03-03 VITALS — BP 120/70

## 2015-03-03 DIAGNOSIS — R300 Dysuria: Secondary | ICD-10-CM | POA: Diagnosis not present

## 2015-03-03 DIAGNOSIS — B373 Candidiasis of vulva and vagina: Secondary | ICD-10-CM | POA: Diagnosis not present

## 2015-03-03 DIAGNOSIS — Z30431 Encounter for routine checking of intrauterine contraceptive device: Secondary | ICD-10-CM

## 2015-03-03 DIAGNOSIS — B3731 Acute candidiasis of vulva and vagina: Secondary | ICD-10-CM

## 2015-03-03 LAB — URINALYSIS W MICROSCOPIC + REFLEX CULTURE
Bilirubin Urine: NEGATIVE
CASTS: NONE SEEN [LPF]
Crystals: NONE SEEN [HPF]
Glucose, UA: NEGATIVE
NITRITE: NEGATIVE
PH: 6 (ref 5.0–8.0)

## 2015-03-03 MED ORDER — FLUCONAZOLE 150 MG PO TABS: 150.0000 mg | ORAL_TABLET | Freq: Once | ORAL | Status: DC

## 2015-03-03 MED ORDER — CIPROFLOXACIN HCL 250 MG PO TABS: 250.0000 mg | ORAL_TABLET | Freq: Two times a day (BID) | ORAL | Status: DC

## 2015-03-03 NOTE — Progress Notes (Signed)
Kristen [name] 07/20/1980 [id number]        34 y.o.  G0P0 Presents for IUD follow up exam. Also notes since yesterday some end stream dysuria. No real discharge or irritation. No fever or chills low back pain, urgency or frequency.  Past medical history,surgical history, problem list, medications, allergies, family history and social history were all reviewed and documented in the EPIC chart.  Directed ROS with pertinent positives and negatives documented in the history of present illness/assessment and plan.  Exam: Kim assistant Filed Vitals:   03/03/15 1444  BP: 120/70   General appearance:  Normal Spine without CVA tenderness Abdomen soft nontender without masses guarding rebound Pelvic external BUS vagina normal. Cervix normal with IUD string visualized an appropriate length. Uterus anteverted normal size midline mobile nontender. Adnexa without masses or tenderness.  Assessment/Plan:  34 y.o. G0P0 with normal IUD check. Urinalysis consistent with UTI with 20-40 WBC and many bacteria. Also shows some yeast. Will cover with ciprofloxacin 250 mg twice a day 3 days and Diflucan 150 mg 1 dose. Follow up if symptoms persist, worsen or recur.    [name],Jullian Clayson P MD, 3:07 PM 03/03/2015

## 2015-03-03 NOTE — Patient Instructions (Signed)
Take the antibiotic pill twice daily for 3 days for the urinary tract infection. Take the Diflucan pill once for the vaginal yeast infection. Follow up if your symptoms persist, worsen or recur. Follow up when you are due for your annual exam the end of February 2017

## 2015-03-07 LAB — URINE CULTURE

## 2015-03-15 ENCOUNTER — Encounter: Payer: Self-pay | Admitting: Gynecology

## 2015-03-16 MED ORDER — FLUCONAZOLE 150 MG PO TABS: 150.0000 mg | ORAL_TABLET | Freq: Every day | ORAL | Status: DC

## 2015-03-16 NOTE — Addendum Note (Signed)
Addended by: Aura CampsWEBB, Purnell Daigle L on: 03/16/2015 09:59 AM   Modules accepted: Orders

## 2015-03-16 NOTE — Telephone Encounter (Signed)
Pt was treated for UTI on 03/03/15

## 2015-03-16 NOTE — Telephone Encounter (Signed)
Diflucan 150 mg #2, 1 by mouth daily as needed for yeast

## 2015-03-22 ENCOUNTER — Encounter: Payer: Self-pay | Admitting: Family Medicine

## 2015-05-24 ENCOUNTER — Other Ambulatory Visit: Payer: Self-pay | Admitting: *Deleted

## 2015-05-24 ENCOUNTER — Encounter: Payer: Self-pay | Admitting: Family Medicine

## 2015-05-24 MED ORDER — AMITRIPTYLINE HCL 50 MG PO TABS: 50.0000 mg | ORAL_TABLET | Freq: Every day | ORAL | Status: DC

## 2015-07-28 ENCOUNTER — Ambulatory Visit (INDEPENDENT_AMBULATORY_CARE_PROVIDER_SITE_OTHER): Payer: BC Managed Care – PPO | Admitting: Family Medicine

## 2015-07-28 ENCOUNTER — Encounter: Payer: Self-pay | Admitting: Family Medicine

## 2015-07-28 VITALS — BP 90/68 | HR 93 | Temp 98.3°F | Resp 16 | Wt 183.2 lb

## 2015-07-28 DIAGNOSIS — J029 Acute pharyngitis, unspecified: Secondary | ICD-10-CM

## 2015-07-28 LAB — POCT RAPID STREP A (OFFICE): RAPID STREP A SCREEN: NEGATIVE

## 2015-07-28 NOTE — Progress Notes (Signed)
Subjective:     Patient ID: Kristen [name], female   DOB: 06/20/1980, 35 y.o.   MRN: [id number]  HPI  Chief Complaint  Patient presents with  . Sore Throat    Patient comes in office today with complaints of sore thoat, body aches and headache for the past 12/14hrs. Patient reports that pain in her throat is worse on the left side, she denies any difficulty swallowing liquid or solid foods and denies being exposed to strep or flu virus. Patient has taken otc Tylenol/Motrin for pain.   Reports chills and sweats last night. + flu vaccine. No cough.   Review of Systems     Objective:   Physical Exam  Constitutional: She appears well-developed and well-nourished. No distress.  Ears: T.M's intact with excessive cerumen in the ear canal Throat: tonsils absent with mild posterior pharyngeal erythema Neck: Anterior cervical tenderness Lungs: clear     Assessment:    1. Pharyngitis - POCT rapid strep A    Plan:    Discussed symptomatic treatment for early viral prodrome Work excuse for today.

## 2015-07-28 NOTE — Patient Instructions (Signed)
Discussed use of warm salt water gargles and ibuprofen for sore throat. Discussed use of Mucinex D for congestion, Delsym for cough, and Benadryl for postnasal drainage

## 2015-07-29 ENCOUNTER — Encounter: Payer: Self-pay | Admitting: Family Medicine

## 2015-07-29 ENCOUNTER — Other Ambulatory Visit: Payer: Self-pay | Admitting: Family Medicine

## 2015-07-29 ENCOUNTER — Ambulatory Visit (INDEPENDENT_AMBULATORY_CARE_PROVIDER_SITE_OTHER): Payer: BC Managed Care – PPO | Admitting: Family Medicine

## 2015-07-29 VITALS — BP 90/62 | HR 88 | Temp 99.3°F | Resp 16

## 2015-07-29 DIAGNOSIS — R509 Fever, unspecified: Secondary | ICD-10-CM

## 2015-07-29 DIAGNOSIS — J029 Acute pharyngitis, unspecified: Secondary | ICD-10-CM | POA: Diagnosis not present

## 2015-07-29 MED ORDER — LIDOCAINE VISCOUS 2 % MT SOLN: 10.0000 mL | OROMUCOSAL | Status: DC | PRN

## 2015-07-29 MED ORDER — AMOXICILLIN 400 MG/5ML PO SUSR: 400.0000 mg | Freq: Three times a day (TID) | ORAL | Status: DC

## 2015-07-29 MED ORDER — FIRST-DUKES MOUTHWASH MT SUSP: OROMUCOSAL | Status: DC

## 2015-07-29 NOTE — Progress Notes (Signed)
Patient ID: Kristen [name], female   DOB: 07/28/1980, 35 y.o.   MRN: [id number]       Patient: Kristen [name] Female    DOB: 11/10/1980   35 y.o.   MRN: [id number] Visit Date: 07/29/2015  Today's Provider: Dortha Kernennis Chrismon, PA   Chief Complaint  Patient presents with  . Sore Throat   Subjective:    Sore Throat  This is a new problem. Episode onset: Onset 2 days ago. The problem has been gradually worsening. Neither side of throat is experiencing more pain than the other. There has been no fever (has felt feverish). The pain is moderate. Associated symptoms include congestion, ear pain, a hoarse voice, neck pain and trouble swallowing. Pertinent negatives include no coughing, shortness of breath or stridor. She has had no exposure to strep or mono. She has tried acetaminophen for the symptoms.  Patient was seen yesterday in the office, but reports that her symptoms have worsened overnight. Patient reports that she has been taking Mucinex, Ibuprofen, and Tylenol with no relief. She also mentions that she is starting to have difficulty swallowing. Patient's rapid strep test was negative yesterday.   Patient Active Problem List   Diagnosis Date Noted  . Overweight (BMI 25.0-29.9) 04/14/2013  . History of laparoscopic adjustable gastric banding 01/08/11, Revised 12/25/12 due to slip 12/25/2012  . Encounter for adjustment of gastric lap band 05/09/2011   Past Surgical History  Procedure Laterality Date  . Hernia repair  1985  . Tonsillectomy and adenoidectomy  2007  . Intrauterine device insertion  01/27/2015    Mirena  . Laparoscopic gastric banding N/A 12/26/2012    Procedure: LAPAROSCOPIC GASTRIC BANDING REVISION ;  Surgeon: Atilano InaEric M Wilson, MD;  Location: [facility];  Service: General;  Laterality: N/A;   Family History  Problem Relation Age of Onset  . Hypertension Father   . Stroke Father   . Hyperlipidemia Father   . Migraines Mother   . Cancer Mother     cervical  . Hyperlipidemia Mother     . Bipolar disorder Sister   . Migraines Sister   . Breast cancer Paternal Grandmother [id number]   No Known Allergies   Previous Medications   AMITRIPTYLINE (ELAVIL) 50 MG TABLET    Take 1 tablet (50 mg total) by mouth daily.   CALCIUM CARB-CHOLECALCIFEROL (CALCIUM 500 +D) 500-400 MG-UNIT TABS    Take 1 tablet by mouth daily.   LEVONORGESTREL (MIRENA) 20 MCG/24HR IUD    1 each by Intrauterine route once.     MULTIPLE VITAMINS-MINERALS (MULTIVITAMIN WITH MINERALS) TABLET    Take 1 tablet by mouth daily.     PANTOPRAZOLE (PROTONIX) 40 MG TABLET    TAKE 1 TABLET BY MOUTH EVERY DAY    Review of Systems  Constitutional: Positive for chills and fatigue.  HENT: Positive for congestion, ear pain, hoarse voice, postnasal drip, sore throat, trouble swallowing and voice change.   Respiratory: Negative for apnea, cough, choking, chest tightness, shortness of breath, wheezing and stridor.   Musculoskeletal: Positive for neck pain.    Social History  Substance Use Topics  . Smoking status: Never Smoker   . Smokeless tobacco: Never Used  . Alcohol Use: 0.0 oz/week    0 Standard drinks or equivalent per week     Comment: "socially"   Objective:   BP 90/62 mmHg  Pulse 88  Temp(Src) 99.3 F (37.4 C)  Resp 16  Wt   Physical Exam  Constitutional: She is oriented to  person, place, and time. She appears well-developed and well-nourished.  HENT:  Head: Normocephalic.  Right Ear: External ear normal.  Left Ear: External ear normal.  Nose: Nose normal.  Fiery red posterior pharynx with a couple ulcerations on soft palate and posterior pharynx. Thick yellow to green drainage.  Eyes: Conjunctivae and EOM are normal.  Neck: Normal range of motion. Neck supple.  Very tender but not enlarged submandibular nodes  Cardiovascular: Normal rate and regular rhythm.   Pulmonary/Chest: Effort normal and breath sounds normal.  Abdominal: Soft.  Neurological: She is alert and oriented to person, place, and  time.  Skin: No rash noted.      Assessment & Plan:     1. Acute pharyngitis, unspecified etiology Worsening fiery red throat. Will start antibiotic with Xylocaine before meals and Duke's Mouthwash after meals. Will get throat culture to send to the lab. Encouraged to drink plenty of fluids and recheck pending lab reports. - Culture, Group A Strep - amoxicillin (AMOXIL) 400 MG/5ML suspension; Take 5 mLs (400 mg total) by mouth 3 (three) times daily.  Dispense: 150 mL; Refill: 0 - Diphenhyd-Hydrocort-Nystatin (FIRST-DUKES MOUTHWASH) SUSP; 5-10 mL gargled then swallowed after meals and bedtime  Dispense: 237 mL; Refill: 0 - lidocaine (XYLOCAINE) 2 % solution; Use as directed 10 mLs in the mouth or throat as needed for mouth pain. Prior to meals or bedtime for throat pain.  Dispense: 100 mL; Refill: 0  2. Low grade fever Having headaches and temperature above 99. May continue Tylenol or Advil prn. Recheck if no better in 3 days.       Dortha Kern, PA  [facility]

## 2015-07-30 ENCOUNTER — Encounter: Payer: Self-pay | Admitting: Family Medicine

## 2015-08-02 LAB — CULTURE, GROUP A STREP: Strep A Culture: NEGATIVE

## 2015-08-02 NOTE — Telephone Encounter (Signed)
Pt called in regards to the letter.  She ask that it be faxed to her employer.  That fax number is [phone]  Thanks Barth Kirksteri

## 2015-08-03 ENCOUNTER — Telehealth: Payer: Self-pay

## 2015-08-03 NOTE — Telephone Encounter (Signed)
LMTCB

## 2015-08-03 NOTE — Telephone Encounter (Signed)
-----   Message from Tamsen Roersennis E Chrismon, [place] sent at 08/03/2015  8:33 AM EDT ----- Throat culture negative for strep bacteria. Probably a viral illness. Finish present medication treatment and recheck in the clinic if any remaining throat issues after medication.

## 2015-08-07 NOTE — Telephone Encounter (Signed)
Patient advised as directed below. Patient verbalized understanding.  

## 2015-11-30 ENCOUNTER — Encounter: Payer: Self-pay | Admitting: Family Medicine

## 2015-11-30 ENCOUNTER — Ambulatory Visit (INDEPENDENT_AMBULATORY_CARE_PROVIDER_SITE_OTHER): Payer: BC Managed Care – PPO | Admitting: Family Medicine

## 2015-11-30 VITALS — BP 106/70 | HR 93 | Temp 98.0°F | Resp 16 | Wt 176.0 lb

## 2015-11-30 DIAGNOSIS — N3000 Acute cystitis without hematuria: Secondary | ICD-10-CM

## 2015-11-30 DIAGNOSIS — R300 Dysuria: Secondary | ICD-10-CM

## 2015-11-30 LAB — POCT URINALYSIS DIPSTICK
Bilirubin, UA: NEGATIVE
GLUCOSE UA: NEGATIVE
Ketones, UA: NEGATIVE
NITRITE UA: NEGATIVE
Protein, UA: NEGATIVE
Spec Grav, UA: 1.015
UROBILINOGEN UA: 2
pH, UA: 7

## 2015-11-30 MED ORDER — FLUCONAZOLE 150 MG PO TABS: 150.0000 mg | ORAL_TABLET | Freq: Once | ORAL | Status: DC

## 2015-11-30 MED ORDER — SULFAMETHOXAZOLE-TRIMETHOPRIM 800-160 MG PO TABS: 1.0000 | ORAL_TABLET | Freq: Two times a day (BID) | ORAL | Status: AC

## 2015-11-30 NOTE — Progress Notes (Signed)
       Patient: Kristen PersonsKelli B Foley Female    DOB: 09/28/1980   35 y.o.   MRN: [id number] Visit Date: 11/30/2015  Today's Provider: Mila Merryonald Fisher, MD   Chief Complaint  Patient presents with  . Urinary Tract Infection   Subjective:    Urinary Tract Infection  This is a new problem. Episode onset: 3 days ago. The problem occurs intermittently. The problem has been rapidly worsening. The quality of the pain is described as burning. There has been no fever. Associated symptoms include urgency. Pertinent negatives include no chills, discharge, flank pain, frequency, hematuria, hesitancy, nausea, sweats or vomiting.  Patient states her urine has a strong Amonia smell. She also reports having vaginal itch.      No Known Allergies Current Meds  Medication Sig  . amitriptyline (ELAVIL) 50 MG tablet Take 1 tablet (50 mg total) by mouth daily.  . Calcium Carb-Cholecalciferol (CALCIUM 500 +D) 500-400 MG-UNIT TABS Take 1 tablet by mouth daily.  Marland Kitchen. levonorgestrel (MIRENA) 20 MCG/24HR IUD 1 each by Intrauterine route once.    . Multiple Vitamins-Minerals (MULTIVITAMIN WITH MINERALS) tablet Take 1 tablet by mouth daily.    . pantoprazole (PROTONIX) 40 MG tablet TAKE 1 TABLET BY MOUTH EVERY DAY  . [DISCONTINUED] amoxicillin (AMOXIL) 400 MG/5ML suspension Take 5 mLs (400 mg total) by mouth 3 (three) times daily.  . [DISCONTINUED] Diphenhyd-Hydrocort-Nystatin (FIRST-DUKES MOUTHWASH) SUSP 5-10 mL gargled then swallowed after meals and bedtime  . [DISCONTINUED] lidocaine (XYLOCAINE) 2 % solution Use as directed 10 mLs in the mouth or throat as needed for mouth pain. Prior to meals or bedtime for throat pain.    Review of Systems  Constitutional: Negative for fever, chills, appetite change and fatigue.  Respiratory: Negative for chest tightness and shortness of breath.   Cardiovascular: Negative for chest pain and palpitations.  Gastrointestinal: Negative for nausea, vomiting and abdominal pain.    Genitourinary: Positive for dysuria and urgency. Negative for hesitancy, frequency, hematuria, flank pain, vaginal bleeding, vaginal discharge and vaginal pain.       Vaginal itching  Neurological: Negative for dizziness and weakness.    Social History  Substance Use Topics  . Smoking status: Never Smoker   . Smokeless tobacco: Never Used  . Alcohol Use: 0.0 oz/week    0 Standard drinks or equivalent per week     Comment: "socially"   Objective:   BP 106/70 mmHg  Pulse 93  Temp(Src) 98 F (36.7 C) (Oral)  Resp 16  Wt 176 lb (79.833 kg)  SpO2 98%  Physical Exam  General Appearance:    Alert, cooperative, no distress  Abdomen:  No CVA tenderness        Assessment & Plan:     1. Dysuria  - POCT urinalysis dipstick  2. Acute cystitis without hematuria  - Urine culture - sulfamethoxazole-trimethoprim (BACTRIM DS,SEPTRA DS) 800-160 MG tablet; Take 1 tablet by mouth 2 (two) times daily.  Dispense: 14 tablet; Refill: 0 - fluconazole (DIFLUCAN) 150 MG tablet; Take 1 tablet (150 mg total) by mouth once.  Dispense: 1 tablet; Refill: 0  Call if symptoms change or if not rapidly improving.          Mila Merryonald Fisher, MD  [facility]

## 2015-12-02 LAB — URINE CULTURE

## 2016-03-25 ENCOUNTER — Encounter: Payer: Self-pay | Admitting: Family Medicine

## 2016-03-25 ENCOUNTER — Ambulatory Visit (INDEPENDENT_AMBULATORY_CARE_PROVIDER_SITE_OTHER): Payer: BC Managed Care – PPO | Admitting: Family Medicine

## 2016-03-25 VITALS — BP 108/64 | HR 72 | Temp 98.4°F | Resp 16 | Wt 178.0 lb

## 2016-03-25 DIAGNOSIS — T753XXA Motion sickness, initial encounter: Secondary | ICD-10-CM | POA: Diagnosis not present

## 2016-03-25 DIAGNOSIS — Z7189 Other specified counseling: Secondary | ICD-10-CM | POA: Diagnosis not present

## 2016-03-25 DIAGNOSIS — Z23 Encounter for immunization: Secondary | ICD-10-CM | POA: Diagnosis not present

## 2016-03-25 DIAGNOSIS — Z7184 Encounter for health counseling related to travel: Secondary | ICD-10-CM

## 2016-03-25 MED ORDER — SCOPOLAMINE 1 MG/3DAYS TD PT72: 1.0000 | MEDICATED_PATCH | TRANSDERMAL | 1 refills | Status: DC

## 2016-03-25 NOTE — Patient Instructions (Addendum)
Recommend you take 2 Bismuth subsalicylate tablets (Pepto-bismol) with each meal to reduce risk of traveler's diarrhea   Food Poisoning and Traveling Food poisoning is an illness caused by organisms present in something you ate or drank. Some types of food poisoning trigger symptoms quickly. Others may take 1-2 weeks for symptoms to appear. Symptoms of food poisoning include:   Diarrhea.  Cramping.  Fever.  Vomiting.  Dizziness.  Aches and pains. Before you travel, learn as much as you can about the foodborne illnesses that are common in the areas where you are going. The risk for food poisoning varies from country to country and from one region of the world to another.  Countries with a low risk include:   [place].  [place].  [place].  [place].  [place].  Some countries in [place]. Countries with a mid-range risk include:  Countries in [place].  [place].  Some [place]. Countries with a high risk include:   Countries in [place].  Countries in the [place].  Countries in [place].  [place].  Countries in [place]. WHAT TYPES OF ILLNESS CAN BE PASSED THROUGH FOOD AND DRINKS? Most cases of food poisoning are caused by bacteria or viruses, such as:   E. coli.  Campylobacteriosis.  Shigellosis.  Salmonellosis.  Norovirus.  Rotavirus.  Astrovirus. Food poisoning can also be caused by some microscopic parasites. These are organisms that live off of another larger organism. Illness caused by parasites can take 1-2 weeks to appear and may last several months. The illnesses include:  Giardiasis.  Amebiasis.  Cyclosporiasis.  Cryptosporidiosis. Medicines are available to treat these infections. HOW CAN I DECREASE MY RISK OF FOOD POISONING WHILE TRAVELING? Good hand hygiene always helps protect your health. Carry small bottles of alcohol-based hand sanitizer. Use it to clean your hands before you eat.  Also follow these basic guidelines for eating and drinking while traveling. Foods that are generally safe to eat:   Food that is thoroughly cooked.  Food that is served hot.  Hard-boiled eggs.  Fruits and vegetables you wash and peel yourself.  Milk or cheese that is treated with high heat (pasteurized). Foods to avoid:   Raw or undercooked foods.  Raw or runny eggs.  Food that is not hot (such as food that has been on a buffet or picnic table for a while).  Raw fruits or vegetables that have not been washed and peeled.  Other items made with fresh vegetables or fruits, like salad and salsa.  Milk or cheese that is not pasteurized.  Meat from local animals, such as monkeys and bats. Drinks that are generally safe:  Bottled waters, soda, or sports drinks.  Drinks you know were sealed until you opened them.  Water you know has been treated, boiled, or filtered to remove microorganisms.  Ice from treated or bottled water.  Drinks made with boiling water, such as tea or coffee.  Pasteurized milk. Drinks to avoid:  Water from the tap or a well.  Water from a fresh water source, such as a stream.  Ice from a tap, well, or fresh water source.  Beverages that include water from a well, tap, or fresh water source.  Milk that is not pasteurized.  Beverages from soda fountains. WHAT SHOULD I DO IF I THINK I HAVE DEVELOPED FOOD POISONING WHILE TRAVELING?  If you have been vomiting or have diarrhea, drink water or other fluids to replace what you lost.  Take  over-the-counter medicine to stop diarrhea. Consider packing a supply of antidiarrheal medicine to take with you.  Contact your health care provider if your symptoms do not clear up after a few days. HOW IS FOOD POISONING TREATED? Most cases of food poisoning go away without treatment within 48 hours. Food poisoning caused by bacteria may be treated with antibiotic medicines.Viruses cannot be treated with  antibiotics.Illnesses caused by parasites might respond to antiparasitic medicines. You can also treat symptoms of food poisoning with medicines to:  Prevent or slow diarrhea (antidiarrheals).  Rehydrate your body. Oral rehydration therapy (ORT) helps your body replace fluids and electrolytes lost in diarrhea or vomit.  Treat rashes caused by diarrhea using hydrocortisone cream. SHOULD I BE PROACTIVELY TREATED FOR FOOD POISONING BEFORE I TRAVEL? Talk to your health care provider about your personal risk for contracting food poisoning while traveling. Discuss the foodborne illnesses that are common in the areas you plan to visit. Make sure you understand how to use any medicines your health care provider recommends. Some medicines can help prevent food poisoning. These include:  Bismuth subsalicylate (BSS). This is an ingredient in over-the-counter antidiarrheal medicines. It might help some adult travelers prevent illness.  Probiotics or "good" bacteria. Taking probiotics might help prevent some illnesses.  Antibiotics. These protect against bacteria only. Taking antibiotics to prevent food poisoning is usually suggested only for people who have a compromised immune system. HOW CAN I LEARN MORE?  Visit a travel medicine clinic or speak with a health care provider who specializes in travel medicine as soon as you know your travel plans.  Check the Travelers' Health section on the website of the Centers for Disease Control and Prevention (CDC): https://barron.com/http://wwwnc.cdc.gov/travel   This information is not intended to replace advice given to you by your health care provider. Make sure you discuss any questions you have with your health care provider.   Document Released: 07/27/2002 Document Revised: 05/27/2014 Document Reviewed: 08/13/2013 Elsevier Interactive Patient Education [employer]2016 Elsevier Inc.

## 2016-03-25 NOTE — Progress Notes (Signed)
       Patient: Kristen [name] Female    DOB: 08/07/1980   35 y.o.   MRN: [id number] Visit Date: 03/25/2016  Today's Provider: Mila Merryonald Fisher, MD   Chief Complaint  Patient presents with  . Travel Consult   Subjective:    HPI Patient comes in today requesting medications to help decrease sea sickness. She reports that she is going a cruise in December (1 month from now), and wants to discuss treatment. She will be [place] the [place]. Patient is also requesting that she get her vaccines updated. She knows that she is due for her Tetanus shot, and she would like to get that today. She feels well with no complaints today.     No Known Allergies   Current Outpatient Prescriptions:  .  amitriptyline (ELAVIL) 50 MG tablet, Take 1 tablet (50 mg total) by mouth daily., Disp: 90 tablet, Rfl: 4 .  Calcium Carb-Cholecalciferol (CALCIUM 500 +D) 500-400 MG-UNIT TABS, Take 1 tablet by mouth daily., Disp: , Rfl:  .  levonorgestrel (MIRENA) 20 MCG/24HR IUD, 1 each by Intrauterine route once.  , Disp: , Rfl:  .  Multiple Vitamins-Minerals (MULTIVITAMIN WITH MINERALS) tablet, Take 1 tablet by mouth daily.  , Disp: , Rfl:  .  pantoprazole (PROTONIX) 40 MG tablet, TAKE 1 TABLET BY MOUTH EVERY DAY, Disp: 30 tablet, Rfl: 2  Review of Systems  Constitutional: Negative.   Respiratory: Negative.   Cardiovascular: Negative.   Neurological: Negative.     Social History  Substance Use Topics  . Smoking status: Never Smoker  . Smokeless tobacco: Never Used  . Alcohol use 0.0 oz/week     Comment: "socially"   Objective:   BP 108/64 (BP Location: Right Arm, Patient Position: Sitting, Cuff Size: Normal)   Pulse 72   Temp 98.4 F (36.9 C)   Resp 16   Wt 178 lb (80.7 kg)   BMI 28.73 kg/m   Physical Exam  General appearance: alert, well developed, well nourished, cooperative and in no distress Head: Normocephalic, without obvious abnormality, atraumatic Respiratory:  Respirations even and unlabored, normal respiratory rate Extremities: No gross deformities Skin: Skin color, texture, turgor normal. No rashes seen  Psych: Appropriate mood and affect. Neurologic: Mental status: Alert, oriented to person, place, and time, thought content appropriate.     Assessment & Plan:     1. Travel advice encounter Recommended flu vaccine which she refused.  - Td : Tetanus/diphtheria >7yo Preservative  free - Hepatitis A vaccine adult IM  2. Motion sickness, initial encounter  - scopolamine (TRANSDERM-SCOP, 1.5 MG,) 1 MG/3DAYS; Place 1 patch (1.5 mg total) onto the skin every 3 (three) days.  Dispense: 4 patch; Refill: 1       Mila Merryonald Fisher, MD  [facility]

## 2016-05-08 ENCOUNTER — Other Ambulatory Visit: Payer: Self-pay | Admitting: Family Medicine

## 2016-06-03 ENCOUNTER — Encounter: Payer: Self-pay | Admitting: Gynecology

## 2016-06-03 ENCOUNTER — Ambulatory Visit (INDEPENDENT_AMBULATORY_CARE_PROVIDER_SITE_OTHER): Payer: BC Managed Care – PPO | Admitting: Gynecology

## 2016-06-03 VITALS — BP 116/76 | Ht 66.0 in | Wt 175.0 lb

## 2016-06-03 DIAGNOSIS — Z30431 Encounter for routine checking of intrauterine contraceptive device: Secondary | ICD-10-CM

## 2016-06-03 DIAGNOSIS — Z13220 Encounter for screening for lipoid disorders: Secondary | ICD-10-CM | POA: Diagnosis not present

## 2016-06-03 DIAGNOSIS — Z01419 Encounter for gynecological examination (general) (routine) without abnormal findings: Secondary | ICD-10-CM | POA: Diagnosis not present

## 2016-06-03 DIAGNOSIS — Z1151 Encounter for screening for human papillomavirus (HPV): Secondary | ICD-10-CM | POA: Diagnosis not present

## 2016-06-03 NOTE — Patient Instructions (Signed)

## 2016-06-03 NOTE — Addendum Note (Signed)
Addended by: Dayna BarkerGARDNER, Mosella Kasa K on: 06/03/2016 04:26 PM   Modules accepted: Orders

## 2016-06-03 NOTE — Progress Notes (Signed)
    Kristen NaegeliKelli B [name] 03/19/1981 [id number]        36 y.o.  G0P0 for annual exam.    Past medical history,surgical history, problem list, medications, allergies, family history and social history were all reviewed and documented as reviewed in the EPIC chart.  ROS:  Performed with pertinent positives and negatives included in the history, assessment and plan.   Additional significant findings :  None   Exam: Kristen PortelaKim [name] assistant Vitals:   06/03/16 1555  BP: 116/76  Weight: 175 lb (79.4 kg)  Height: 5\' 6"  (1.676 m)   Body mass index is 28.25 kg/m.  General appearance:  Normal affect, orientation and appearance. Skin: Grossly normal HEENT: Without gross lesions.  No cervical or supraclavicular adenopathy. Thyroid normal.  Lungs:  Clear without wheezing, rales or rhonchi Cardiac: RR, without RMG Abdominal:  Soft, nontender, without masses, guarding, rebound, organomegaly or hernia Breasts:  Examined lying and sitting without masses, retractions, discharge or axillary adenopathy. Pelvic:  Ext, BUS, Vagina normal  Cervix normal. IUD string visualized. Pap/HPV  Uterus anteverted, normal size, shape and contour, midline and mobile nontender   Adnexa without masses or tenderness    Anus and perineum normal   Rectovaginal normal sphincter tone without palpated masses or tenderness.    Assessment/Plan:  36 y.o. G0P0 female for annual exam with occasional light menses, Mirena IUD.   1. Mirena IUD 01/2015. Doing well with occasional light menses. IUD string visualized an appropriate length. 2. Pap smear/HPV 04/2012. Pap smear/HPV done today approaching 5 year interval. No history of significant abnormal Pap smears. 3. Breast health. SBE monthly reviewed. Screening mammographic recommendations between 35 and 40 reviewed. Paternal grandmother with postmenopausal breast cancer with no other history. We'll plan baseline mammogram at age 36. 4. Health maintenance. Baseline CBC, CMP, lipid  profile, urinalysis ordered. Follow up in one year, sooner as needed.     Kristen LordsFONTAINE,Kristen [name] P MD, 4:14 PM 06/03/2016

## 2016-06-04 LAB — URINALYSIS W MICROSCOPIC + REFLEX CULTURE
BILIRUBIN URINE: NEGATIVE
Bacteria, UA: NONE SEEN [HPF]
Casts: NONE SEEN [LPF]
Glucose, UA: NEGATIVE
Hgb urine dipstick: NEGATIVE
NITRITE: NEGATIVE
PH: 7 (ref 5.0–8.0)
Protein, ur: NEGATIVE
SPECIFIC GRAVITY, URINE: 1.022 (ref 1.001–1.035)
Yeast: NONE SEEN [HPF]

## 2016-06-04 LAB — CBC WITH DIFFERENTIAL/PLATELET
BASOS ABS: 0 {cells}/uL (ref 0–200)
BASOS PCT: 0 %
EOS ABS: 160 {cells}/uL (ref 15–500)
EOS PCT: 2 %
HCT: 39.7 % (ref 35.0–45.0)
Hemoglobin: 13.1 g/dL (ref 11.7–15.5)
LYMPHS PCT: 33 %
Lymphs Abs: 2640 cells/uL (ref 850–3900)
MCH: 31.3 pg (ref 27.0–33.0)
MCHC: 33 g/dL (ref 32.0–36.0)
MCV: 94.7 fL (ref 80.0–100.0)
MONOS PCT: 7 %
MPV: 9.1 fL (ref 7.5–12.5)
Monocytes Absolute: 560 cells/uL (ref 200–950)
Neutro Abs: 4640 cells/uL (ref 1500–7800)
Neutrophils Relative %: 58 %
Platelets: 340 10*3/uL (ref 140–400)
RBC: 4.19 MIL/uL (ref 3.80–5.10)
RDW: 12.6 % (ref 11.0–15.0)
WBC: 8 10*3/uL (ref 3.8–10.8)

## 2016-06-04 LAB — COMPREHENSIVE METABOLIC PANEL
ALT: 15 U/L (ref 6–29)
AST: 16 U/L (ref 10–30)
Albumin: 4 g/dL (ref 3.6–5.1)
Alkaline Phosphatase: 65 U/L (ref 33–115)
BUN: 10 mg/dL (ref 7–25)
CHLORIDE: 105 mmol/L (ref 98–110)
CO2: 26 mmol/L (ref 20–31)
Calcium: 9.4 mg/dL (ref 8.6–10.2)
Creat: 0.74 mg/dL (ref 0.50–1.10)
GLUCOSE: 60 mg/dL — AB (ref 65–99)
POTASSIUM: 4.8 mmol/L (ref 3.5–5.3)
Sodium: 140 mmol/L (ref 135–146)
Total Bilirubin: 0.4 mg/dL (ref 0.2–1.2)
Total Protein: 6.5 g/dL (ref 6.1–8.1)

## 2016-06-04 LAB — PAP IG AND HPV HIGH-RISK: HPV DNA HIGH RISK: NOT DETECTED

## 2016-06-04 LAB — LIPID PANEL
CHOL/HDL RATIO: 3.3 ratio (ref <5.0)
Cholesterol: 152 mg/dL (ref <200)
HDL: 46 mg/dL — ABNORMAL LOW (ref >50)
LDL CALC: 90 mg/dL (ref <100)
Triglycerides: 82 mg/dL (ref <150)
VLDL: 16 mg/dL (ref <30)

## 2016-06-05 LAB — URINE CULTURE: Organism ID, Bacteria: NO GROWTH

## 2016-09-24 ENCOUNTER — Ambulatory Visit (INDEPENDENT_AMBULATORY_CARE_PROVIDER_SITE_OTHER): Payer: BC Managed Care – PPO | Admitting: Family Medicine

## 2016-09-24 DIAGNOSIS — Z23 Encounter for immunization: Secondary | ICD-10-CM

## 2016-10-15 NOTE — Progress Notes (Signed)
Vaccine only, no MD visit.  

## 2017-05-24 ENCOUNTER — Other Ambulatory Visit: Payer: Self-pay | Admitting: Family Medicine

## 2017-06-04 ENCOUNTER — Encounter: Payer: Self-pay | Admitting: Gynecology

## 2017-06-04 ENCOUNTER — Ambulatory Visit (INDEPENDENT_AMBULATORY_CARE_PROVIDER_SITE_OTHER): Payer: BC Managed Care – PPO | Admitting: Gynecology

## 2017-06-04 VITALS — BP 116/74 | Ht 65.5 in | Wt 178.0 lb

## 2017-06-04 DIAGNOSIS — Z30431 Encounter for routine checking of intrauterine contraceptive device: Secondary | ICD-10-CM

## 2017-06-04 DIAGNOSIS — Z01419 Encounter for gynecological examination (general) (routine) without abnormal findings: Secondary | ICD-10-CM | POA: Diagnosis not present

## 2017-06-04 NOTE — Progress Notes (Signed)
    Addison NaegeliKelli B Foley 02/19/1981 [id number]        37 y.o.  G0P0 for annual gynecologic exam.  Doing well without gynecologic complaints  Past medical history,surgical history, problem list, medications, allergies, family history and social history were all reviewed and documented as reviewed in the EPIC chart.  ROS:  Performed with pertinent positives and negatives included in the history, assessment and plan.   Additional significant findings : None   Exam: Kennon PortelaKim Gardner assistant Vitals:   06/04/17 1540  BP: 116/74  Weight: 178 lb (80.7 kg)  Height: 5' 5.5" (1.664 m)   Body mass index is 29.17 kg/m.  General appearance:  Normal affect, orientation and appearance. Skin: Grossly normal HEENT: Without gross lesions.  No cervical or supraclavicular adenopathy. Thyroid normal.  Lungs:  Clear without wheezing, rales or rhonchi Cardiac: RR, without RMG Abdominal:  Soft, nontender, without masses, guarding, rebound, organomegaly or hernia.  Laparoscopic gastric band port palpated right upper quadrant. Breasts:  Examined lying and sitting without masses, retractions, discharge or axillary adenopathy. Pelvic:  Ext, BUS, Vagina: Normal  Cervix: Normal.  IUD string visualized  Uterus: Anteverted, normal size, shape and contour, midline and mobile nontender   Adnexa: Without masses or tenderness    Anus and perineum: Normal   Rectovaginal: Normal sphincter tone without palpated masses or tenderness.    Assessment/Plan:  37 y.o. G0P0 female for annual gynecologic exam with scant menses, Mirena IUD.   1. Mirena IUD 01/2015.  Doing well with scant menses.  IUD string visualized. 2. Breast exam normal.  SBE monthly reviewed.  Plan baseline mammogram at age 37. 3. Pap smear/HPV 05/2016.  No Pap smear done today.  No history of abnormal Pap smears.  Plan repeat Pap smear at 5-year interval per current screening guidelines. 4. Health maintenance.  Baseline CBC and CMP ordered.  Lipid profile last  year normal and not repeated.  Follow-up in 1 year, sooner as needed.   Dara Lordsimothy P Jelina Paulsen MD, 4:00 PM 06/04/2017

## 2017-06-04 NOTE — Patient Instructions (Signed)
Follow-up in 1 year for annual exam, sooner if any issues. 

## 2017-06-05 LAB — COMPREHENSIVE METABOLIC PANEL
AG Ratio: 2 (calc) (ref 1.0–2.5)
ALT: 21 U/L (ref 6–29)
AST: 17 U/L (ref 10–30)
Albumin: 4.7 g/dL (ref 3.6–5.1)
Alkaline phosphatase (APISO): 63 U/L (ref 33–115)
BILIRUBIN TOTAL: 0.8 mg/dL (ref 0.2–1.2)
BUN: 8 mg/dL (ref 7–25)
CO2: 25 mmol/L (ref 20–32)
Calcium: 9.6 mg/dL (ref 8.6–10.2)
Chloride: 103 mmol/L (ref 98–110)
Creat: 0.65 mg/dL (ref 0.50–1.10)
GLOBULIN: 2.4 g/dL (ref 1.9–3.7)
GLUCOSE: 76 mg/dL (ref 65–99)
Potassium: 3.8 mmol/L (ref 3.5–5.3)
Sodium: 137 mmol/L (ref 135–146)
Total Protein: 7.1 g/dL (ref 6.1–8.1)

## 2017-06-05 LAB — CBC WITH DIFFERENTIAL/PLATELET
BASOS ABS: 41 {cells}/uL (ref 0–200)
BASOS PCT: 0.6 %
Eosinophils Absolute: 138 cells/uL (ref 15–500)
Eosinophils Relative: 2 %
HEMATOCRIT: 39.8 % (ref 35.0–45.0)
HEMOGLOBIN: 13.9 g/dL (ref 11.7–15.5)
LYMPHS ABS: 2884 {cells}/uL (ref 850–3900)
MCH: 32.2 pg (ref 27.0–33.0)
MCHC: 34.9 g/dL (ref 32.0–36.0)
MCV: 92.1 fL (ref 80.0–100.0)
MONOS PCT: 6.4 %
MPV: 9.7 fL (ref 7.5–12.5)
NEUTROS ABS: 3395 {cells}/uL (ref 1500–7800)
Neutrophils Relative %: 49.2 %
Platelets: 312 10*3/uL (ref 140–400)
RBC: 4.32 10*6/uL (ref 3.80–5.10)
RDW: 11.3 % (ref 11.0–15.0)
Total Lymphocyte: 41.8 %
WBC mixed population: 442 cells/uL (ref 200–950)
WBC: 6.9 10*3/uL (ref 3.8–10.8)

## 2017-09-29 ENCOUNTER — Ambulatory Visit: Payer: BC Managed Care – PPO | Admitting: Family Medicine

## 2017-09-29 ENCOUNTER — Encounter: Payer: Self-pay | Admitting: Family Medicine

## 2017-09-29 VITALS — BP 98/58 | HR 98 | Temp 98.3°F | Resp 16 | Wt 181.0 lb

## 2017-09-29 DIAGNOSIS — G43909 Migraine, unspecified, not intractable, without status migrainosus: Secondary | ICD-10-CM

## 2017-09-29 MED ORDER — AMITRIPTYLINE HCL 50 MG PO TABS: 100.0000 mg | ORAL_TABLET | Freq: Every day | ORAL | 0 refills | Status: DC

## 2017-09-29 NOTE — Patient Instructions (Signed)
   Call for prescription for  tablet amitriptyline if you are tolerating medication over the next week

## 2017-09-29 NOTE — Progress Notes (Signed)
       Patient: Kristen [name] Female    DOB: 12-07-1980   37 y.o.   MRN: [id number] Visit Date: 09/29/2017  Today's Provider: [name], MD   Chief Complaint  Patient presents with  . Migraine   Subjective:    HPI  Patient is here to discuss more frequent migraines and headaches. Patient is currently taking amitriptyline and ibuprofen. Patient states the amitriptyline is not as affective as it was.   Has been having 1-2 severe migraines a month for the last 6 months. Does not tolerate triptans.   Wt Readings from Last 3 Encounters:  09/29/17 181 lb (82.1 kg)  06/04/17 178 lb (80.7 kg)  06/03/16 175 lb (79.4 kg)     No Known Allergies   Current Outpatient Medications:  .  amitriptyline (ELAVIL) 50 MG tablet, TAKE 1 TABLET DAILY, Disp: 90 tablet, Rfl: 2 .  Calcium Carb-Cholecalciferol (CALCIUM 500 +D) 500-400 MG-UNIT TABS, Take 1 tablet by mouth daily., Disp: , Rfl:  .  levonorgestrel (MIRENA) 20 MCG/24HR IUD, 1 each by Intrauterine route once.  , Disp: , Rfl:  .  Multiple Vitamins-Minerals (MULTIVITAMIN WITH MINERALS) tablet, Take 1 tablet by mouth daily.  , Disp: , Rfl:  .  pantoprazole (PROTONIX) 40 MG tablet, TAKE 1 TABLET BY MOUTH EVERY DAY, Disp: 30 tablet, Rfl: 2  Review of Systems  Constitutional: Negative for appetite change, chills, fatigue and fever.  Respiratory: Negative for chest tightness and shortness of breath.   Cardiovascular: Negative for chest pain and palpitations.  Gastrointestinal: Negative for abdominal pain, nausea and vomiting.  Neurological: Positive for headaches. Negative for dizziness and weakness.    Social History   Tobacco Use  . Smoking status: Never Smoker  . Smokeless tobacco: Never Used  Substance Use Topics  . Alcohol use: Yes    Alcohol/week: 0.0 oz    Comment: Rare   Objective:   BP (!) 98/58 (BP Location: Right Arm, Patient Position: Sitting, Cuff Size: Large)   Pulse 98   Temp 98.3 F (36.8 C) (Oral)   Resp 16    Wt 181 lb (82.1 kg)   SpO2 98%   BMI 29.66 kg/m  Vitals:   09/29/17 1602  BP: (!) 98/58  Pulse: 98  Resp: 16  Temp: 98.3 F (36.8 C)  TempSrc: Oral  SpO2: 98%  Weight: 181 lb (82.1 kg)     Physical Exam  General appearance: alert, well developed, well nourished, cooperative and in no distress Head: Normocephalic, without obvious abnormality, atraumatic Respiratory: Respirations even and unlabored, normal respiratory rate Extremities: No gross deformities Skin: Skin color, texture, turgor normal. No rashes seen  Psych: Appropriate mood and affect. Neurologic: Mental status: Alert, oriented to person, place, and time, thought content appropriate.  EKG: NSR, normal QTC    Assessment & Plan:     1. Migraine syndrome Double amitriptyline to 2 x  daily. Well send prescription for  tablets to mail order if tolerating over the next week.  - EKG 12-Lead       [name], MD  [facility]

## 2017-10-03 ENCOUNTER — Encounter: Payer: Self-pay | Admitting: Family Medicine

## 2017-10-03 DIAGNOSIS — G43909 Migraine, unspecified, not intractable, without status migrainosus: Secondary | ICD-10-CM

## 2017-10-07 MED ORDER — AMITRIPTYLINE HCL 100 MG PO TABS: 100.0000 mg | ORAL_TABLET | Freq: Every day | ORAL | 3 refills | Status: DC

## 2017-10-20 ENCOUNTER — Other Ambulatory Visit: Payer: Self-pay | Admitting: General Surgery

## 2017-10-20 DIAGNOSIS — Z9884 Bariatric surgery status: Secondary | ICD-10-CM

## 2017-11-13 ENCOUNTER — Ambulatory Visit
Admission: RE | Admit: 2017-11-13 | Discharge: 2017-11-13 | Disposition: A | Discharge status: Home / Self Care | Payer: BC Managed Care – PPO | Source: Ambulatory Visit | Attending: General Surgery | Admitting: General Surgery

## 2017-11-13 DIAGNOSIS — Z9884 Bariatric surgery status: Secondary | ICD-10-CM | POA: Diagnosis not present

## 2017-11-28 ENCOUNTER — Other Ambulatory Visit: Payer: Self-pay | Admitting: General Surgery

## 2017-11-28 DIAGNOSIS — Z9884 Bariatric surgery status: Secondary | ICD-10-CM

## 2017-12-02 ENCOUNTER — Ambulatory Visit
Admission: RE | Admit: 2017-12-02 | Discharge: 2017-12-02 | Disposition: A | Discharge status: Home / Self Care | Payer: BC Managed Care – PPO | Source: Ambulatory Visit | Attending: General Surgery | Admitting: General Surgery

## 2017-12-02 DIAGNOSIS — K219 Gastro-esophageal reflux disease without esophagitis: Secondary | ICD-10-CM | POA: Insufficient documentation

## 2017-12-02 DIAGNOSIS — Z9884 Bariatric surgery status: Secondary | ICD-10-CM | POA: Insufficient documentation

## 2018-01-01 ENCOUNTER — Ambulatory Visit: Payer: BC Managed Care – PPO | Admitting: Family Medicine

## 2018-01-01 ENCOUNTER — Encounter: Payer: Self-pay | Admitting: Family Medicine

## 2018-01-01 VITALS — BP 108/68 | HR 80 | Temp 97.6°F | Resp 16 | Wt 185.0 lb

## 2018-01-01 DIAGNOSIS — R42 Dizziness and giddiness: Secondary | ICD-10-CM

## 2018-01-01 DIAGNOSIS — R490 Dysphonia: Secondary | ICD-10-CM | POA: Diagnosis not present

## 2018-01-01 DIAGNOSIS — R5383 Other fatigue: Secondary | ICD-10-CM

## 2018-01-01 NOTE — Progress Notes (Signed)
Patient: Kristen PersonsKelli B Foley Female    DOB: 05/26/1980   37 y.o.   MRN: [id number] Visit Date: 01/01/2018  Today's Provider: Mila Merryonald Ernisha Sorn, MD   Chief Complaint  Patient presents with  . Fatigue   Subjective:    HPI Fatigue: Patient comes in today complaining of fatigue and dizziness for the past month. These symptoms have occurred intermittently and seem to be worsening.  Feels well rested in the morning and does not feel sleepy during the day, just very fatigued. No shortness of breath, no palpitations, no chest pains, no nausea or vomiting, no change in appetite.   Wt Readings from Last 3 Encounters:  01/01/18 185 lb (83.9 kg)  09/29/17 181 lb (82.1 kg)  06/04/17 178 lb (80.7 kg)   She also reports episode of feeling dizzy, like she is moving or spinning even when still. Has episodes every few weeks for several months, and may last minutes or hours before resolving. No ringing in ears or trouble hearing. She has been trying to drink more water. Feels a little nauseated with episodes, no vomiting or stomach pains.   She also reports frequent episodes of hoarseness.Not associated with any other URI or allergy symptoms. She does have long history of GERD controlled with pantoprazole. She denies having any other reflux symptoms since being on pantoprazole except for mild heart burn periodically, maybe once or twice a month, for which she takes OTC Zantac. She is concerned the reflux may be causing some damage.    Migraine syndrome From 09/29/2017-Doubled amitriptyline to 2 x 50mg  daily. Well send prescription for 100mg  tablets to mail order if tolerating over the next week. Patient reports good compliance with treatment, good tolerance and good symptom control.      Allergies  Allergen Reactions  . Imitrex [Sumatriptan] Nausea And Vomiting     Current Outpatient Medications:  .  amitriptyline (ELAVIL) 100 MG tablet, Take 1 tablet (100 mg total) by mouth at bedtime., Disp: 90  tablet, Rfl: 3 .  Calcium Carb-Cholecalciferol (CALCIUM 500 +D) 500-400 MG-UNIT TABS, Take 1 tablet by mouth daily., Disp: , Rfl:  .  levonorgestrel (MIRENA) 20 MCG/24HR IUD, 1 each by Intrauterine route once.  , Disp: , Rfl:  .  Multiple Vitamins-Minerals (MULTIVITAMIN WITH MINERALS) tablet, Take 1 tablet by mouth daily.  , Disp: , Rfl:  .  pantoprazole (PROTONIX) 40 MG tablet, TAKE 1 TABLET BY MOUTH EVERY DAY, Disp: 30 tablet, Rfl: 2  Review of Systems  Constitutional: Positive for fatigue. Negative for appetite change, chills and fever.  HENT: Positive for voice change (hoarseness). Negative for congestion, nosebleeds, postnasal drip, rhinorrhea, sinus pressure, sinus pain, sneezing and sore throat.   Respiratory: Negative for chest tightness and shortness of breath.   Cardiovascular: Negative for chest pain and palpitations.  Gastrointestinal: Negative for abdominal pain, nausea and vomiting.  Neurological: Positive for dizziness and light-headedness. Negative for weakness.    Social History   Tobacco Use  . Smoking status: Never Smoker  . Smokeless tobacco: Never Used  Substance Use Topics  . Alcohol use: Yes    Alcohol/week: 0.0 standard drinks    Comment: Rare   Objective:   BP 108/68 (BP Location: Left Arm, Patient Position: Sitting, Cuff Size: Normal)   Pulse 80   Temp 97.6 F (36.4 C) (Oral)   Resp 16   Wt 185 lb (83.9 kg)   SpO2 99% Comment: room air  BMI 30.32 kg/m  Vitals:  01/01/18 1107  BP: 108/68  Pulse: 80  Resp: 16  Temp: 97.6 F (36.4 C)  TempSrc: Oral  SpO2: 99%  Weight: 185 lb (83.9 kg)     Physical Exam   General Appearance:    Alert, cooperative, no distress  HENT: ENT exam normal, no neck nodes or sinus tenderness  Eyes:    PERRL, conjunctiva/corneas clear, EOM's intact       Lungs:     Clear to auscultation bilaterally, respirations unlabored  Heart:    Regular rate and rhythm, no murmurs.   Neurologic:   Awake, alert, oriented x 3. No  apparent focal neurological           defect. Negative romberg. Normal gait and balance.           Assessment & Plan:     1. Vertigo  - Ambulatory referral to ENT  2. Hoarseness Has history GERD but other sx controlled with PPI. Have ENT take a look.  - Ambulatory referral to ENT  3. Other fatigue  - Comprehensive metabolic panel - CBC with Differential/Platelet - VITAMIN D 25 Hydroxy (Vit-D Deficiency, Fractures) - TSH       Mila Merryonald Aundra Pung, MD  [facility]

## 2018-01-02 LAB — COMPREHENSIVE METABOLIC PANEL
A/G RATIO: 1.9 (ref 1.2–2.2)
ALBUMIN: 4.3 g/dL (ref 3.5–5.5)
ALT: 13 IU/L (ref 0–32)
AST: 14 IU/L (ref 0–40)
Alkaline Phosphatase: 62 IU/L (ref 39–117)
BILIRUBIN TOTAL: 0.4 mg/dL (ref 0.0–1.2)
BUN / CREAT RATIO: 12 (ref 9–23)
BUN: 7 mg/dL (ref 6–20)
CHLORIDE: 105 mmol/L (ref 96–106)
CO2: 22 mmol/L (ref 20–29)
Calcium: 9.7 mg/dL (ref 8.7–10.2)
Creatinine, Ser: 0.58 mg/dL (ref 0.57–1.00)
GFR calc Af Amer: 136 mL/min/{1.73_m2} (ref >59)
GFR calc non Af Amer: 118 mL/min/{1.73_m2} (ref >59)
Globulin, Total: 2.3 g/dL (ref 1.5–4.5)
Glucose: 76 mg/dL (ref 65–99)
POTASSIUM: 4.3 mmol/L (ref 3.5–5.2)
SODIUM: 139 mmol/L (ref 134–144)
Total Protein: 6.6 g/dL (ref 6.0–8.5)

## 2018-01-02 LAB — CBC WITH DIFFERENTIAL/PLATELET
BASOS ABS: 0 10*3/uL (ref 0.0–0.2)
Basos: 0 %
EOS (ABSOLUTE): 0.1 10*3/uL (ref 0.0–0.4)
Eos: 2 %
Hematocrit: 38.4 % (ref 34.0–46.6)
Hemoglobin: 13.2 g/dL (ref 11.1–15.9)
Immature Grans (Abs): 0 10*3/uL (ref 0.0–0.1)
Immature Granulocytes: 0 %
LYMPHS ABS: 2.5 10*3/uL (ref 0.7–3.1)
Lymphs: 42 %
MCH: 33.2 pg — AB (ref 26.6–33.0)
MCHC: 34.4 g/dL (ref 31.5–35.7)
MCV: 97 fL (ref 79–97)
Monocytes Absolute: 0.4 10*3/uL (ref 0.1–0.9)
Monocytes: 6 %
NEUTROS ABS: 2.9 10*3/uL (ref 1.4–7.0)
Neutrophils: 50 %
PLATELETS: 289 10*3/uL (ref 150–450)
RBC: 3.98 x10E6/uL (ref 3.77–5.28)
RDW: 12.7 % (ref 12.3–15.4)
WBC: 6 10*3/uL (ref 3.4–10.8)

## 2018-01-02 LAB — VITAMIN D 25 HYDROXY (VIT D DEFICIENCY, FRACTURES): Vit D, 25-Hydroxy: 29.1 ng/mL — ABNORMAL LOW (ref 30.0–100.0)

## 2018-01-02 LAB — TSH: TSH: 1.02 u[IU]/mL (ref 0.450–4.500)

## 2018-01-24 LAB — SPECIMEN STATUS REPORT

## 2018-01-24 LAB — FOLATE: Folate: 3.8 ng/mL (ref >3.0)

## 2018-01-24 LAB — VITAMIN B12: VITAMIN B 12: 357 pg/mL (ref 232–1245)

## 2018-03-16 ENCOUNTER — Ambulatory Visit: Payer: BC Managed Care – PPO | Admitting: Family Medicine

## 2018-03-16 ENCOUNTER — Encounter: Payer: Self-pay | Admitting: Family Medicine

## 2018-03-16 VITALS — BP 106/70 | HR 92 | Temp 98.3°F | Resp 16 | Wt 174.0 lb

## 2018-03-16 DIAGNOSIS — G4700 Insomnia, unspecified: Secondary | ICD-10-CM | POA: Diagnosis not present

## 2018-03-16 DIAGNOSIS — F439 Reaction to severe stress, unspecified: Secondary | ICD-10-CM

## 2018-03-16 MED ORDER — ALPRAZOLAM 0.5 MG PO TABS: 0.2500 mg | ORAL_TABLET | ORAL | 2 refills | Status: DC | PRN

## 2018-03-16 MED ORDER — ZOLPIDEM TARTRATE 5 MG PO TABS: 5.0000 mg | ORAL_TABLET | Freq: Every evening | ORAL | 1 refills | Status: DC | PRN

## 2018-03-16 NOTE — Progress Notes (Signed)
Patient: Kristen [name] Female    DOB: 25-Jun-1980   37 y.o.   MRN: [id number] Visit Date: 03/16/2018  Today's Provider: [name], MD   Chief Complaint  Patient presents with  . Anxiety   Subjective:    Anxiety  Presents for initial visit. Onset was 1 to 4 weeks ago. The problem has been gradually worsening. Symptoms include decreased concentration, depressed mood, excessive worry, insomnia, nervous/anxious behavior and panic. Patient reports no chest pain, compulsions, confusion, dizziness, dry mouth, feeling of choking, hyperventilation, impotence, irritability, malaise, muscle tension, nausea, obsessions, palpitations, restlessness, shortness of breath or suicidal ideas. The severity of symptoms is interfering with daily activities. The quality of sleep is poor. Nighttime awakenings: several.    she states sx triggered with separation from her husband last week. She did start seeing a counselor last week and has follow up scheduled this week. She has taken alprazolam several years ago which she states worked well. She does not drink alcohol and consumes very little caffeine.      Allergies  Allergen Reactions  . Imitrex [Sumatriptan] Nausea And Vomiting     Current Outpatient Medications:  .  amitriptyline (ELAVIL) 100 MG tablet, Take 1 tablet (100 mg total) by mouth at bedtime., Disp: 90 tablet, Rfl: 3 .  Calcium Carb-Cholecalciferol (CALCIUM 500 +D) 500-400 MG-UNIT TABS, Take 1 tablet by mouth daily., Disp: , Rfl:  .  levonorgestrel (MIRENA) 20 MCG/24HR IUD, 1 each by Intrauterine route once.  , Disp: , Rfl:  .  Multiple Vitamins-Minerals (MULTIVITAMIN WITH MINERALS) tablet, Take 1 tablet by mouth daily.  , Disp: , Rfl:  .  pantoprazole (PROTONIX) 40 MG tablet, TAKE 1 TABLET BY MOUTH EVERY DAY, Disp: 30 tablet, Rfl: 2  Review of Systems  Constitutional: Negative.  Negative for irritability.  Respiratory: Negative.  Negative for shortness of breath.     Cardiovascular: Negative.  Negative for chest pain and palpitations.  Gastrointestinal: Negative.  Negative for nausea.  Endocrine: Negative.   Genitourinary: Negative for impotence.  Musculoskeletal: Negative.   Neurological: Negative for dizziness, light-headedness and headaches.  Psychiatric/Behavioral: Positive for decreased concentration. Negative for agitation, behavioral problems, confusion, dysphoric mood, hallucinations, self-injury, sleep disturbance and suicidal ideas. The patient is nervous/anxious and has insomnia. The patient is not hyperactive.     Social History   Tobacco Use  . Smoking status: Never Smoker  . Smokeless tobacco: Never Used  Substance Use Topics  . Alcohol use: Yes    Alcohol/week: 0.0 standard drinks    Comment: Rare   Objective:   BP 106/70 (BP Location: Right Arm, Patient Position: Sitting, Cuff Size: Large)   Pulse 92   Temp 98.3 F (36.8 C) (Oral)   Resp 16   Wt 174 lb (78.9 kg)   BMI 28.51 kg/m  Vitals:   03/16/18 1351  BP: 106/70  Pulse: 92  Resp: 16  Temp: 98.3 F (36.8 C)  TempSrc: Oral  Weight: 174 lb (78.9 kg)     Physical Exam   General Appearance:    Alert, cooperative, anxious, shaking a bit.   Eyes:    PERRL, conjunctiva/corneas clear, EOM's intact       Lungs:     Clear to auscultation bilaterally, respirations unlabored  Heart:    Regular rate and rhythm  Neurologic:   Awake, alert, oriented x 3. No apparent focal neurological           defect.  Assessment & Plan:     1. Situational stress  - ALPRAZolam (XANAX) 0.5 MG tablet; Take 0.5-1 tablets (0.25-0.5 mg total) by mouth every 4 (four) hours as needed for anxiety.  Dispense: 30 tablet; Refill: 2  Continue follow up with counselor. She is to follow up if not much better in a 3-4 weeks. Consider SSRI if still having to take alprazolam.   2. Insomnia, unspecified type  - zolpidem (AMBIEN) 5 MG tablet; Take 1-2 tablets (5-10 mg total) by mouth at  bedtime as needed for sleep. Do not take within 4 hours of taking alprazolam  Dispense: 30 tablet; Refill: 1 Advised not take zolpidem within 4 hours of taking alprazolam.   Work excuse today.       [name], MD  [facility]

## 2018-03-21 ENCOUNTER — Encounter: Payer: Self-pay | Admitting: Family Medicine

## 2018-03-24 ENCOUNTER — Other Ambulatory Visit: Payer: Self-pay | Admitting: Family Medicine

## 2018-03-24 DIAGNOSIS — F439 Reaction to severe stress, unspecified: Secondary | ICD-10-CM

## 2018-03-24 DIAGNOSIS — G4700 Insomnia, unspecified: Secondary | ICD-10-CM

## 2018-03-24 MED ORDER — ZOLPIDEM TARTRATE 10 MG PO TABS: 5.0000 mg | ORAL_TABLET | Freq: Every evening | ORAL | 4 refills | Status: DC | PRN

## 2018-03-24 MED ORDER — ALPRAZOLAM 1 MG PO TABS: 0.5000 mg | ORAL_TABLET | ORAL | 3 refills | Status: DC | PRN

## 2018-04-27 ENCOUNTER — Ambulatory Visit (INDEPENDENT_AMBULATORY_CARE_PROVIDER_SITE_OTHER): Payer: BC Managed Care – PPO | Admitting: Family Medicine

## 2018-04-27 ENCOUNTER — Encounter: Payer: Self-pay | Admitting: Family Medicine

## 2018-04-27 VITALS — BP 112/70 | HR 68 | Temp 98.3°F | Resp 16 | Wt 163.0 lb

## 2018-04-27 DIAGNOSIS — G4700 Insomnia, unspecified: Secondary | ICD-10-CM

## 2018-04-27 DIAGNOSIS — F439 Reaction to severe stress, unspecified: Secondary | ICD-10-CM | POA: Diagnosis not present

## 2018-04-27 MED ORDER — TRAZODONE HCL 100 MG PO TABS: 100.0000 mg | ORAL_TABLET | Freq: Every day | ORAL | 1 refills | Status: DC

## 2018-04-27 MED ORDER — SERTRALINE HCL 25 MG PO TABS: ORAL_TABLET | ORAL | 1 refills | Status: DC

## 2018-04-27 NOTE — Progress Notes (Signed)
Patient: Kristen [name] Female    DOB: [birth year]   37 y.o.   MRN: [id number] Visit Date: 04/27/2018  Today's Provider: [name], MD   Chief Complaint  Patient presents with  . Anxiety  . Insomnia   Subjective:    Anxiety  Presents for follow-up visit. Symptoms include decreased concentration, depressed mood, excessive worry, insomnia, nervous/anxious behavior and panic. Patient reports no confusion, dizziness or suicidal ideas.    Insomnia  Primary symptoms: fragmented sleep, sleep disturbance, difficulty falling asleep.  The current episode started more than one month. The problem has been gradually worsening since onset. How many beverages per day that contain caffeine: 0 - 1.  Past treatments include medication. The treatment provided no relief. Typical bedtime:  8-10 P.M..  How long after going to bed to you fall asleep: over an hour.   PMH includes: family stress or anxiety.  Stress and anxiety due to separation from her husband in October.  She reports she is not sleeping at all and zolpidem does not seem to be helping. Alprazolam does help when she feels like she is going to have a panic attack. She has not been able to work since December 3rd due to extreme anxiety and fatigue from not being able to sleep at night. She request FMLA paperwork to off up to 12 weeks.      Allergies  Allergen Reactions  . Imitrex [Sumatriptan] Nausea And Vomiting     Current Outpatient Medications:  .  ALPRAZolam (XANAX) 1 MG tablet, Take 0.5-1 tablets (0.5-1 mg total) by mouth every 4 (four) hours as needed for anxiety., Disp: 30 tablet, Rfl: 3 .  amitriptyline (ELAVIL) 100 MG tablet, Take 1 tablet (100 mg total) by mouth at bedtime., Disp: 90 tablet, Rfl: 3 .  Calcium Carb-Cholecalciferol (CALCIUM 500 +D) 500-400 MG-UNIT TABS, Take 1 tablet by mouth daily., Disp: , Rfl:  .  levonorgestrel (MIRENA) 20 MCG/24HR IUD, 1 each by Intrauterine route once.  , Disp: , Rfl:  .  Multiple  Vitamins-Minerals (MULTIVITAMIN WITH MINERALS) tablet, Take 1 tablet by mouth daily.  , Disp: , Rfl:  .  pantoprazole (PROTONIX) 40 MG tablet, TAKE 1 TABLET BY MOUTH EVERY DAY, Disp: 30 tablet, Rfl: 2 .  zolpidem (AMBIEN) 10 MG tablet, Take 0.5-1 tablets (5-10 mg total) by mouth at bedtime as needed for sleep. Do not take within 4 hours of taking alprazolam, Disp: 30 tablet, Rfl: 4  Review of Systems  Constitutional: Positive for fatigue. Negative for activity change, appetite change, chills, diaphoresis, fever and unexpected weight change.  Respiratory: Negative.   Cardiovascular: Negative.   Gastrointestinal: Negative.   Neurological: Negative for dizziness, light-headedness and headaches.  Psychiatric/Behavioral: Positive for decreased concentration, dysphoric mood and sleep disturbance. Negative for agitation, behavioral problems, confusion, hallucinations, self-injury and suicidal ideas. The patient is nervous/anxious and has insomnia. The patient is not hyperactive.     Social History   Tobacco Use  . Smoking status: Never Smoker  . Smokeless tobacco: Never Used  Substance Use Topics  . Alcohol use: Yes    Alcohol/week: 0.0 standard drinks    Comment: Rare   Objective:   BP 112/70 (BP Location: Right Arm, Patient Position: Sitting, Cuff Size: Normal)   Pulse 68   Temp 98.3 F (36.8 C) (Oral)   Resp 16   Wt 163 lb (73.9 kg)   BMI 26.71 kg/m    Physical Exam  General appearance: alert, well developed, well  nourished, cooperative and in no distress Head: Normocephalic, without obvious abnormality, atraumatic Respiratory: Respirations even and unlabored, normal respiratory rate Extremities: No gross deformities Skin: Skin color, texture, turgor normal. No rashes seen  Psych: Appropriate mood and affect. Neurologic: Mental status: Alert, oriented to person, place, and time, thought content appropriate.    Assessment & Plan:     1. Situational stress  - sertraline  (ZOLOFT) 25 MG tablet; 1/2 daily for four days, then 1 daily for four, then 2 daily for four days, then 3 daily  Dispense: 60 tablet; Refill: 1  She is still seeing counselor, she is interested in seeing Dr. Maryruth BunKapur. Will telephone her next week, and if not improving with sertraline will refer Dr. Maryruth BunKapur.   2. Insomnia, unspecified type No relief with zolpidem, change to - traZODone (DESYREL) 100 MG tablet; Take 1 tablet (100 mg total) by mouth at bedtime.  Dispense: 30 tablet; Refill: 1  Counseled on potential interaction between medications, and to reduce amitriptyline to 50mg  after titrating sertraline to 50mg  daily.   FMLA forms completed and to be faxed to her imployer.     Over half of this 25 minute visit were spent in counseling and coordinating care.       Mila Merryonald Fisher, MD  [facility]

## 2018-04-27 NOTE — Patient Instructions (Signed)
   Reduce amitriptyline to 1/2 tablet at bedtime when you start taking 2 sertraline daily

## 2018-04-30 ENCOUNTER — Telehealth: Payer: Self-pay | Admitting: Family Medicine

## 2018-04-30 NOTE — Telephone Encounter (Signed)
Dr. Sherrie MustacheFisher, do you know the status of her paperwork?

## 2018-04-30 NOTE — Telephone Encounter (Signed)
Pt calling to check on the status of her FMLA paperwork that is supposed to be faxed to her place of work.  Please advise.  Thanks, [employer]

## 2018-05-01 NOTE — Telephone Encounter (Signed)
FMLA will be finished and sent before the end of the day today.

## 2018-05-08 ENCOUNTER — Telehealth: Payer: Self-pay | Admitting: Family Medicine

## 2018-05-08 NOTE — Telephone Encounter (Signed)
-----   Message from Malva Limesonald E Fisher, MD sent at 05/06/2018  8:02 AM EST ----- Regarding: FW: refer kapur if not doing better on sertraline  ----- Message ----- From: Malva LimesFisher, Donald E, MD Sent: 05/06/2018 To: Malva Limesonald E Fisher, MD Subject: refer kapur if not doing better on sertraline

## 2018-05-08 NOTE — Telephone Encounter (Signed)
Please check with patient and see how she is doing with sertraline. We talked about possible referral to Dr. Maryruth BunKapur if medication wasn't helping. If doing better can just stick with sertraline. If not we can set up referral with Dr. Maryruth BunKapur.

## 2018-05-08 NOTE — Telephone Encounter (Signed)
Pt states she is feeling a little better.  She want to give it some more time before going forward with the referral.   Thanks,   -Vernona RiegerLaura

## 2018-06-04 ENCOUNTER — Encounter: Payer: Self-pay | Admitting: Family Medicine

## 2018-06-04 DIAGNOSIS — G4700 Insomnia, unspecified: Secondary | ICD-10-CM

## 2018-06-04 DIAGNOSIS — F439 Reaction to severe stress, unspecified: Secondary | ICD-10-CM

## 2018-06-05 ENCOUNTER — Encounter: Payer: BC Managed Care – PPO | Admitting: Gynecology

## 2018-06-05 ENCOUNTER — Other Ambulatory Visit: Payer: Self-pay | Admitting: Family Medicine

## 2018-06-05 DIAGNOSIS — F439 Reaction to severe stress, unspecified: Secondary | ICD-10-CM

## 2018-06-05 MED ORDER — ALPRAZOLAM 1 MG PO TABS: 0.5000 mg | ORAL_TABLET | ORAL | 3 refills | Status: DC | PRN

## 2018-06-05 MED ORDER — SERTRALINE HCL 100 MG PO TABS: 100.0000 mg | ORAL_TABLET | Freq: Every day | ORAL | 2 refills | Status: DC

## 2018-06-05 MED ORDER — TRAZODONE HCL 100 MG PO TABS: 100.0000 mg | ORAL_TABLET | Freq: Every day | ORAL | 1 refills | Status: DC

## 2018-06-05 NOTE — Telephone Encounter (Signed)
Pharmacy requesting refills. Thanks!  

## 2018-06-08 ENCOUNTER — Encounter: Payer: Self-pay | Admitting: Gynecology

## 2018-06-08 ENCOUNTER — Ambulatory Visit (INDEPENDENT_AMBULATORY_CARE_PROVIDER_SITE_OTHER): Payer: BC Managed Care – PPO | Admitting: Gynecology

## 2018-06-08 VITALS — BP 118/70 | Ht 66.0 in | Wt 163.0 lb

## 2018-06-08 DIAGNOSIS — E559 Vitamin D deficiency, unspecified: Secondary | ICD-10-CM

## 2018-06-08 DIAGNOSIS — Z01419 Encounter for gynecological examination (general) (routine) without abnormal findings: Secondary | ICD-10-CM | POA: Diagnosis not present

## 2018-06-08 DIAGNOSIS — Z13220 Encounter for screening for lipoid disorders: Secondary | ICD-10-CM

## 2018-06-08 NOTE — Progress Notes (Signed)
    Kristen [name] [birth year] [id number]        38 y.o.  G0P0 for annual gynecologic exam.  Without gynecologic complaints  Past medical history,surgical history, problem list, medications, allergies, family history and social history were all reviewed and documented as reviewed in the EPIC chart.  ROS:  Performed with pertinent positives and negatives included in the history, assessment and plan.   Additional significant findings : None   Exam: Kristen [name] assistant Vitals:   06/08/18 1213  BP: 118/70  Weight: 163 lb (73.9 kg)  Height: 5\' 6"  (1.676 m)   Body mass index is 26.31 kg/m.  General appearance:  Normal affect, orientation and appearance. Skin: Grossly normal HEENT: Without gross lesions.  No cervical or supraclavicular adenopathy. Thyroid normal.  Lungs:  Clear without wheezing, rales or rhonchi Cardiac: RR, without RMG Abdominal:  Soft, nontender, without masses, guarding, rebound, organomegaly or hernia Breasts:  Examined lying and sitting without masses, retractions, discharge or axillary adenopathy. Pelvic:  Ext, BUS, Vagina: Normal  Cervix: Normal.  IUD string visualized  Uterus: Anteverted, normal size, shape and contour, midline and mobile nontender   Adnexa: Without masses or tenderness    Anus and perineum: Normal   Rectovaginal: Normal sphincter tone without palpated masses or tenderness.    Assessment/Plan:  38 y.o. G0P0 female for annual gynecologic exam without menses, Mirena IUD.   1. Mirena IUD 01/2015.  Doing well.  IUD string visualized 2. Pap smear/HPV 05/2016.  No Pap smear done today.  No history of abnormal Pap smears.  Plan repeat Pap smear/HPV at 5-year interval per current screening guidelines. 3. Breast exam is normal.  Plan screening mammography at age 19. 4. Health maintenance.  Recently had CBC and CMP.  Future orders placed for fasting lipid profile and vitamin D as she was recently checked in August and was found to be 29.  Follow-up in  1 year, sooner as needed.   Dara Lords MD, 12:35 PM 06/08/2018

## 2018-06-08 NOTE — Patient Instructions (Signed)
Follow-up for fasting blood work as arranged.  Follow-up in 1 year for annual exam 

## 2018-06-09 ENCOUNTER — Other Ambulatory Visit: Payer: Self-pay | Admitting: Family Medicine

## 2018-06-10 NOTE — Telephone Encounter (Signed)
Letter has been completed and sent to medical records to be faxed.

## 2018-06-10 NOTE — Telephone Encounter (Signed)
Please clarify with patient what dose of amitriptyline she is taking. We got refill request from [facility] for 50, but the last prescription I wrote in May was for 100mg  tablets.

## 2018-06-10 NOTE — Telephone Encounter (Signed)
Patient reports that she is taking Amitriptyline 100 mg. She was also asking if her letter was fax?

## 2018-06-12 ENCOUNTER — Other Ambulatory Visit: Payer: BC Managed Care – PPO

## 2018-06-12 DIAGNOSIS — E559 Vitamin D deficiency, unspecified: Secondary | ICD-10-CM

## 2018-06-12 DIAGNOSIS — Z13220 Encounter for screening for lipoid disorders: Secondary | ICD-10-CM

## 2018-06-13 LAB — LIPID PANEL
CHOL/HDL RATIO: 3.6 (calc) (ref <5.0)
Cholesterol: 243 mg/dL — ABNORMAL HIGH (ref <200)
HDL: 68 mg/dL (ref >50)
LDL CHOLESTEROL (CALC): 157 mg/dL — AB
Non-HDL Cholesterol (Calc): 175 mg/dL (calc) — ABNORMAL HIGH (ref <130)
Triglycerides: 79 mg/dL (ref <150)

## 2018-06-13 LAB — VITAMIN D 25 HYDROXY (VIT D DEFICIENCY, FRACTURES): Vit D, 25-Hydroxy: 14 ng/mL — ABNORMAL LOW (ref 30–100)

## 2018-06-15 ENCOUNTER — Encounter: Payer: Self-pay | Admitting: Gynecology

## 2018-06-15 ENCOUNTER — Encounter: Payer: Self-pay | Admitting: Family Medicine

## 2018-06-15 ENCOUNTER — Other Ambulatory Visit: Payer: Self-pay | Admitting: Gynecology

## 2018-06-15 DIAGNOSIS — E559 Vitamin D deficiency, unspecified: Secondary | ICD-10-CM

## 2018-06-15 MED ORDER — VITAMIN D (ERGOCALCIFEROL) 1.25 MG (50000 UNIT) PO CAPS: 50000.0000 [IU] | ORAL_CAPSULE | ORAL | 3 refills | Status: DC

## 2018-06-15 NOTE — Progress Notes (Signed)
Vitamin

## 2018-07-08 ENCOUNTER — Other Ambulatory Visit: Payer: Self-pay | Admitting: Family Medicine

## 2018-07-08 DIAGNOSIS — F439 Reaction to severe stress, unspecified: Secondary | ICD-10-CM

## 2018-07-08 MED ORDER — SERTRALINE HCL 100 MG PO TABS: 100.0000 mg | ORAL_TABLET | Freq: Every day | ORAL | 3 refills | Status: DC

## 2018-08-11 ENCOUNTER — Other Ambulatory Visit: Payer: Self-pay | Admitting: Family Medicine

## 2018-08-11 DIAGNOSIS — G4700 Insomnia, unspecified: Secondary | ICD-10-CM

## 2018-09-21 ENCOUNTER — Encounter: Payer: Self-pay | Admitting: Physician Assistant

## 2018-09-21 ENCOUNTER — Ambulatory Visit (INDEPENDENT_AMBULATORY_CARE_PROVIDER_SITE_OTHER): Payer: BC Managed Care – PPO | Admitting: Physician Assistant

## 2018-09-21 ENCOUNTER — Encounter: Payer: BC Managed Care – PPO | Admitting: Physician Assistant

## 2018-09-21 DIAGNOSIS — R509 Fever, unspecified: Secondary | ICD-10-CM | POA: Diagnosis not present

## 2018-09-21 DIAGNOSIS — M791 Myalgia, unspecified site: Secondary | ICD-10-CM

## 2018-09-21 DIAGNOSIS — R05 Cough: Secondary | ICD-10-CM

## 2018-09-21 DIAGNOSIS — R6889 Other general symptoms and signs: Secondary | ICD-10-CM | POA: Diagnosis not present

## 2018-09-21 DIAGNOSIS — R6883 Chills (without fever): Secondary | ICD-10-CM | POA: Diagnosis not present

## 2018-09-21 DIAGNOSIS — Z20822 Contact with and (suspected) exposure to covid-19: Secondary | ICD-10-CM

## 2018-09-21 DIAGNOSIS — R059 Cough, unspecified: Secondary | ICD-10-CM

## 2018-09-21 MED ORDER — THERMOMETER MISC: 0 refills | Status: DC

## 2018-09-21 MED ORDER — GUAIFENESIN ER 600 MG PO TB12: 600.0000 mg | ORAL_TABLET | Freq: Two times a day (BID) | ORAL | 0 refills | Status: DC | PRN

## 2018-09-21 MED ORDER — ACETAMINOPHEN 500 MG PO TABS: 500.0000 mg | ORAL_TABLET | Freq: Four times a day (QID) | ORAL | 0 refills | Status: DC | PRN

## 2018-09-21 MED ORDER — BENZONATATE 200 MG PO CAPS: 200.0000 mg | ORAL_CAPSULE | Freq: Three times a day (TID) | ORAL | 0 refills | Status: DC | PRN

## 2018-09-21 NOTE — Patient Instructions (Signed)
Person Under Monitoring Name: Kristen [name]  Location: [address] [place] [address]   Infection Prevention Recommendations for Individuals Confirmed to have, or Being Evaluated for, 2019 Novel Coronavirus (COVID-19) Infection Who Receive Care at Home  Individuals who are confirmed to have, or are being evaluated for, COVID-19 should follow the prevention steps below until a healthcare provider or local or state health department says they can return to normal activities.  Stay home except to get medical care You should restrict activities outside your home, except for getting medical care. Do not go to work, school, or public areas, and do not use public transportation or taxis.  Call ahead before visiting your doctor Before your medical appointment, call the healthcare provider and tell them that you have, or are being evaluated for, COVID-19 infection. This will help the healthcare provider's office take steps to keep other people from getting infected. Ask your healthcare provider to call the local or state health department.  Monitor your symptoms Seek prompt medical attention if your illness is worsening (e.g., difficulty breathing). Before going to your medical appointment, call the healthcare provider and tell them that you have, or are being evaluated for, COVID-19 infection. Ask your healthcare provider to call the local or state health department.  Wear a facemask You should wear a facemask that covers your nose and mouth when you are in the same room with other people and when you visit a healthcare provider. People who live with or visit you should also wear a facemask while they are in the same room with you.  Separate yourself from other people in your home As much as possible, you should stay in a different room from other people in your home. Also, you should use a separate bathroom, if available.  Avoid sharing household items You should not share  dishes, drinking glasses, cups, eating utensils, towels, bedding, or other items with other people in your home. After using these items, you should wash them thoroughly with soap and water.  Cover your coughs and sneezes Cover your mouth and nose with a tissue when you cough or sneeze, or you can cough or sneeze into your sleeve. Throw used tissues in a lined trash can, and immediately wash your hands with soap and water for at least 20 seconds or use an alcohol-based hand rub.  Wash your [employer] your hands often and thoroughly with soap and water for at least 20 seconds. You can use an alcohol-based hand sanitizer if soap and water are not available and if your hands are not visibly dirty. Avoid touching your eyes, nose, and mouth with unwashed hands.   Prevention Steps for Caregivers and Household Members of Individuals Confirmed to have, or Being Evaluated for, COVID-19 Infection Being Cared for in the Home  If you live with, or provide care at home for, a person confirmed to have, or being evaluated for, COVID-19 infection please follow these guidelines to prevent infection:  Follow healthcare provider's instructions Make sure that you understand and can help the patient follow any healthcare provider instructions for all care.  Provide for the patient's basic needs You should help the patient with basic needs in the home and provide support for getting groceries, prescriptions, and other personal needs.  Monitor the patient's symptoms If they are getting sicker, call his or her medical provider and tell them that the patient has, or is being evaluated for, COVID-19 infection. This will help the healthcare provider's office  take steps to keep other people from getting infected. Ask the healthcare provider to call the local or state health department.  Limit the number of people who have contact with the patient  If possible, have only one caregiver for the patient.  Other  household members should stay in another home or place of residence. If this is not possible, they should stay  in another room, or be separated from the patient as much as possible. Use a separate bathroom, if available.  Restrict visitors who do not have an essential need to be in the home.  Keep older adults, very young children, and other sick people away from the patient Keep older adults, very young children, and those who have compromised immune systems or chronic health conditions away from the patient. This includes people with chronic heart, lung, or kidney conditions, diabetes, and cancer.  Ensure good ventilation Make sure that shared spaces in the home have good air flow, such as from an air conditioner or an opened window, weather permitting.  Wash your hands often  Wash your hands often and thoroughly with soap and water for at least 20 seconds. You can use an alcohol based hand sanitizer if soap and water are not available and if your hands are not visibly dirty.  Avoid touching your eyes, nose, and mouth with unwashed hands.  Use disposable paper towels to dry your hands. If not available, use dedicated cloth towels and replace them when they become wet.  Wear a facemask and gloves  Wear a disposable facemask at all times in the room and gloves when you touch or have contact with the patient's blood, body fluids, and/or secretions or excretions, such as sweat, saliva, sputum, nasal mucus, vomit, urine, or feces.  Ensure the mask fits over your nose and mouth tightly, and do not touch it during use.  Throw out disposable facemasks and gloves after using them. Do not reuse.  Wash your hands immediately after removing your facemask and gloves.  If your personal clothing becomes contaminated, carefully remove clothing and launder. Wash your hands after handling contaminated clothing.  Place all used disposable facemasks, gloves, and other waste in a lined container before  disposing them with other household waste.  Remove gloves and wash your hands immediately after handling these items.  Do not share dishes, glasses, or other household items with the patient  Avoid sharing household items. You should not share dishes, drinking glasses, cups, eating utensils, towels, bedding, or other items with a patient who is confirmed to have, or being evaluated for, COVID-19 infection.  After the person uses these items, you should wash them thoroughly with soap and water.  Wash laundry thoroughly  Immediately remove and wash clothes or bedding that have blood, body fluids, and/or secretions or excretions, such as sweat, saliva, sputum, nasal mucus, vomit, urine, or feces, on them.  Wear gloves when handling laundry from the patient.  Read and follow directions on labels of laundry or clothing items and detergent. In general, wash and dry with the warmest temperatures recommended on the label.  Clean all areas the individual has used often  Clean all touchable surfaces, such as counters, tabletops, doorknobs, bathroom fixtures, toilets, phones, keyboards, tablets, and bedside tables, every day. Also, clean any surfaces that may have blood, body fluids, and/or secretions or excretions on them.  Wear gloves when cleaning surfaces the patient has come in contact with.  Use a diluted bleach solution (e.g., dilute bleach with 1 part  bleach and 10 parts water) or a household disinfectant with a label that says EPA-registered for coronaviruses. To make a bleach solution at home, add 1 tablespoon of bleach to 1 quart (4 cups) of water. For a larger supply, add  cup of bleach to 1 gallon (16 cups) of water.  Read labels of cleaning products and follow recommendations provided on product labels. Labels contain instructions for safe and effective use of the cleaning product including precautions you should take when applying the product, such as wearing gloves or eye protection  and making sure you have good ventilation during use of the product.  Remove gloves and wash hands immediately after cleaning.  Monitor yourself for signs and symptoms of illness Caregivers and household members are considered close contacts, should monitor their health, and will be asked to limit movement outside of the home to the extent possible. Follow the monitoring steps for close contacts listed on the symptom monitoring form.   ? If you have additional questions, contact your local health department or call the epidemiologist on call at [phone] (available 24/7). ? This guidance is subject to change. For the most up-to-date guidance from [facility], please refer to their website: TripMetro.hu  This information is directly available on the CDC website: DiscoHelp.si.html    Source:CDC Reference to specific commercial products, manufacturers, companies, or trademarks does not constitute its endorsement or recommendation by the U.S. Government, Department of Health and [employer], or Centers for [employer] and Prevention.

## 2018-09-21 NOTE — Progress Notes (Signed)
Opened in error

## 2018-09-21 NOTE — Progress Notes (Signed)
Virtual Visit via Video Note  I connected with [name] on 09/23/18 at  2:40 PM EDT by a video enabled telemedicine application and verified that I am speaking with the correct person using two identifiers.  Location: Patient: home Provider: [facility]   I discussed the limitations of evaluation and management by telemedicine and the availability of in person appointments. The patient expressed understanding and agreed to proceed.  Margaretann Loveless, PA-C   Patient: Kristen [name] Female    DOB: 04-Jul-1980   38 y.o.   MRN: [id number] Visit Date: 09/21/2018  Today's Provider: Margaretann Loveless, PA-C   Chief Complaint  Patient presents with  . Fever   Subjective:     HPI   Kristen [name] is a 38 yr old female that presents via video visit for acute symptoms of fever, chills, myalgias, sore throat and cough. She reports symptoms started suddenly last night. She states that on 09/20/18 she had felt fatigued all day and then last night developed severe chills. She had to get in the shower to try to warm up and then had sweats. She has been alternating between severe chills and sweats since. She has intense myalgias and fatigue and has been in the bed all day. 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 She has no known exposure to Covid, however.   Allergies  Allergen Reactions  . Imitrex [Sumatriptan] Nausea And Vomiting     Current Outpatient Medications:  .  ALPRAZolam (XANAX) 1 MG tablet, Take 0.5-1 tablets (0.5-1 mg total) by mouth every 4 (four) hours as needed for anxiety., Disp: 30 tablet, Rfl: 3 .  amitriptyline (ELAVIL) 100 MG tablet, Take 1 tablet (100 mg total) by mouth daily., Disp: 90 tablet, Rfl: 3 .  Calcium Carb-Cholecalciferol (CALCIUM 500 +D) 500-400 MG-UNIT TABS, Take 1 tablet by mouth daily., Disp: , Rfl:  .  levonorgestrel (MIRENA) 20 MCG/24HR IUD, 1 each by Intrauterine route once.   , Disp: , Rfl:  .  Multiple Vitamins-Minerals (MULTIVITAMIN WITH MINERALS) tablet, Take 1 tablet by mouth daily.  , Disp: , Rfl:  .  pantoprazole (PROTONIX) 40 MG tablet, TAKE 1 TABLET BY MOUTH EVERY DAY, Disp: 30 tablet, Rfl: 2 .  sertraline (ZOLOFT) 100 MG tablet, Take 1 tablet (100 mg total) by mouth daily., Disp: 30 tablet, Rfl: 3 .  traZODone (DESYREL) 100 MG tablet, Take 1 tablet (100 mg total) by mouth at bedtime., Disp: 30 tablet, Rfl: 6 .  Vitamin D, Ergocalciferol, (DRISDOL) 1.25 MG (50000 UT) CAPS capsule, Take 1 capsule (50,000 Units total) by mouth every 7 (seven) days., Disp: 5 capsule, Rfl: 3  Review of Systems  Constitutional: Positive for chills, diaphoresis and fever. Negative for appetite change and fatigue.  HENT: Positive for sore throat. Negative for congestion, ear pain, nosebleeds, postnasal drip, rhinorrhea and trouble swallowing.   Respiratory: Positive for cough. Negative for chest tightness and shortness of breath.   Cardiovascular: Negative for chest pain and palpitations.  Gastrointestinal: Negative for abdominal pain, nausea and vomiting.  Musculoskeletal: Positive for myalgias.  Neurological: Negative for dizziness and weakness.    Social History   Tobacco Use  . Smoking status: Never Smoker  . Smokeless tobacco: Never Used  Substance Use Topics  . Alcohol use: Yes    Alcohol/week: 0.0 standard drinks    Comment: Rare      Objective:   There  were no vitals taken for this visit. There were no vitals filed for this visit.   Physical Exam Vitals signs reviewed.  Constitutional:      General: She is not in acute distress.    Appearance: Normal appearance. She is well-developed. She is ill-appearing.  HENT:     Head: Normocephalic and atraumatic.  Neck:     Musculoskeletal: Normal range of motion and neck supple.  Pulmonary:     Effort: Pulmonary effort is normal. No respiratory distress (able to talk in complete sentences and hold breath for 5  seconds).  Neurological:     Mental Status: She is alert.  Psychiatric:        Behavior: Behavior normal.        Thought Content: Thought content normal.        Judgment: Judgment normal.        Assessment & Plan    1. Suspected Covid-19 Virus Infection Symptoms c/w possible Covid-19. Will enroll in monitoring program. Advised to use acetaminophen for fevers >100.4F. Mucinex for congestion, tessalon perles for cough, push fluids, rest. Work note provided for 14 day quarantine. May return in 7 days (09/28/18) if asymptomatic x 3 days prior. Home isolation guidelines discussed and information provided through patient instructions via mychart. Call if worsening.  - MyChart COVID-19 home monitoring program; Future - Temperature monitoring; Future - acetaminophen (TYLENOL) 500 MG tablet; Take 1-2 tablets (500-1,000 mg total) by mouth every 6 (six) hours as needed. Do not take unless fever > 100.4  Dispense: 30 tablet; Refill: 0 - guaiFENesin (MUCINEX) 600 MG 12 hr tablet; Take 1-2 tablets (600-1,200 mg total) by mouth 2 (two) times daily as needed.  Dispense: 30 tablet; Refill: 0 - Thermometer MISC; To check temperature BID  Dispense: 1 each; Refill: 0  2. Fever, unspecified fever cause See above medical treatment plan. - MyChart COVID-19 home monitoring program; Future - Temperature monitoring; Future - acetaminophen (TYLENOL) 500 MG tablet; Take 1-2 tablets (500-1,000 mg total) by mouth every 6 (six) hours as needed. Do not take unless fever > 100.4  Dispense: 30 tablet; Refill: 0 - guaiFENesin (MUCINEX) 600 MG 12 hr tablet; Take 1-2 tablets (600-1,200 mg total) by mouth 2 (two) times daily as needed.  Dispense: 30 tablet; Refill: 0 - Thermometer MISC; To check temperature BID  Dispense: 1 each; Refill: 0  3. Chills See above medical treatment plan. - MyChart COVID-19 home monitoring program; Future - Temperature monitoring; Future - guaiFENesin (MUCINEX) 600 MG 12 hr tablet; Take 1-2  tablets (600-1,200 mg total) by mouth 2 (two) times daily as needed.  Dispense: 30 tablet; Refill: 0 - Thermometer MISC; To check temperature BID  Dispense: 1 each; Refill: 0  4. Cough See above medical treatment plan. - MyChart COVID-19 home monitoring program; Future - Temperature monitoring; Future - benzonatate (TESSALON) 200 MG capsule; Take 1 capsule (200 mg total) by mouth 3 (three) times daily as needed for cough.  Dispense: 30 capsule; Refill: 0 - guaiFENesin (MUCINEX) 600 MG 12 hr tablet; Take 1-2 tablets (600-1,200 mg total) by mouth 2 (two) times daily as needed.  Dispense: 30 tablet; Refill: 0 - Thermometer MISC; To check temperature BID  Dispense: 1 each; Refill: 0  5. Myalgia See above medical treatment plan. - MyChart COVID-19 home monitoring program; Future - Temperature monitoring; Future - acetaminophen (TYLENOL) 500 MG tablet; Take 1-2 tablets (500-1,000 mg total) by mouth every 6 (six) hours as needed. Do not take unless fever > 100.4  Dispense: 30 tablet; Refill: 0 - guaiFENesin (MUCINEX) 600 MG 12 hr tablet; Take 1-2 tablets (600-1,200 mg total) by mouth 2 (two) times daily as needed.  Dispense: 30 tablet; Refill: 0 - Thermometer MISC; To check temperature BID  Dispense: 1 each; Refill: 0    I discussed the assessment and treatment plan with the patient. The patient was provided an opportunity to ask questions and all were answered. The patient agreed with the plan and demonstrated an understanding of the instructions.   The patient was advised to call back or seek an in-person evaluation if the symptoms worsen or if the condition fails to improve as anticipated.  I provided 20 minutes of non-face-to-face time during this encounter.  Margaretann Loveless, PA-C  [facility]

## 2018-09-22 ENCOUNTER — Telehealth: Payer: Self-pay | Admitting: *Deleted

## 2018-09-22 ENCOUNTER — Encounter (INDEPENDENT_AMBULATORY_CARE_PROVIDER_SITE_OTHER): Payer: Self-pay

## 2018-09-22 NOTE — Telephone Encounter (Signed)
Attempted to contact pt in regards to her my chart companion response of worsening appetite; left message on pt voicemail [phone]; she is normally seen by Dr [name], [facility].

## 2018-09-22 NOTE — Telephone Encounter (Signed)
Pt called back; she states that she does not have an appetite, and she feels like she is "swallowing a whole lot of phlegm";the pt states she is she is thirsty all the time; she says that she drank 8-9 16 oz solo cups of apple juice and water on 09/21/2018; she says that her urine is clear; pt advised to continue drinking liquids so that her urine remains clear to straw colored, and to contact her PCP; pt also advised to try small meals multiple times daily as opposed to a large meal; also encouraged pt to try jello or popsicles to aid in hydration; she verbalized understanding; will route to provider for notification.

## 2018-09-23 ENCOUNTER — Encounter (INDEPENDENT_AMBULATORY_CARE_PROVIDER_SITE_OTHER): Payer: Self-pay

## 2018-09-24 ENCOUNTER — Telehealth: Payer: Self-pay | Admitting: *Deleted

## 2018-09-24 ENCOUNTER — Encounter (INDEPENDENT_AMBULATORY_CARE_PROVIDER_SITE_OTHER): Payer: Self-pay

## 2018-09-24 ENCOUNTER — Encounter: Payer: Self-pay | Admitting: Physician Assistant

## 2018-09-24 NOTE — Telephone Encounter (Signed)
Contacted pt due to response to my chart companion with complaints of worsening weakness; she says that "my whole body hurts"; she says that she has no energy, and sweating and chills; the pt says that the MD prescribed tylenol, mucinex, and tessalon; she has been taking 1 tylenol; pt advised that she has the following options: * Take 650 mg (two 325 mg pills) by mouth every 4-6 hours as needed. Each Regular Strength Tylenol pill has 325 mg of acetaminophen. The most you should take each day is 3,250 mg (10 Regular Strength pills a day). * Another choice is to take 1,000 mg (two 500 mg pills) every 8 hours as needed. Each Extra Strength Tylenol pill has 500 mg of acetaminophen. The most you should take each day is 3,000 mg (6 Extra Strength pills a day). IBUPROFEN (E.G., MOTRIN, ADVIL): * Take 400 mg (two 200 mg pills) by mouth every 6 hours as needed; and to contact her PCP  she verbalized understanding; the pt normally sees Dr [name]; will route to provider for notification.

## 2018-09-25 ENCOUNTER — Telehealth: Payer: Self-pay | Admitting: *Deleted

## 2018-09-25 ENCOUNTER — Encounter (INDEPENDENT_AMBULATORY_CARE_PROVIDER_SITE_OTHER): Payer: Self-pay

## 2018-09-25 NOTE — Telephone Encounter (Signed)
Contacted pt due to my chart companion responses of worsening weakness, and diarrhea; the pt says that she is calling weakness, "no energy"; she also said that her appetite came back, and she ate solid food; she had 3 episodes of diarrhea on 09/24/2018; she says that she had country style steak, mashed potatoes, and black beans; recommendations made: FLUID THERAPY DURING MILD-MODERATE DIARRHEA: * Drink more fluids, at least 8-10 cups daily. One cup equals 8 oz (240 ml). * WATER: For mild to moderate diarrhea, water is often the best liquid to drink. You should also eat some salty foods (e.g., potato chips, pretzels, saltine crackers). This is important to make sure you are getting enough salt, sugars, and fluids to meet your body's needs. * SPORTS DRINKS: You can also drink a sports drinks (e.g., Gatorade, Powerade) to help treat and prevent dehydration. For it to work best, mix it half and half with water. * Avoid caffeinated beverages (Reason: caffeine is mildly dehydrating). * Avoid alcohol beverages (beer, wine, hard liquor).    5: FOOD AND NUTRITION DURING MILD-MODERATE DIARRHEA * Maintaining some food intake during episodes of diarrhea is important. * Begin with boiled starches / cereals (e.g., potatoes, rice, noodles, wheat, oats) with a small amount of salt to taste. * Other foods that are OK include: bananas, yogurt, crackers, soup. * As the diarrhea starts to get better, you can slowly return to a normal diet.   6: DIARRHEA MEDICINE - Bismuth Subsalicylate (e.g., Kaopectate, Pepto-Bismol): * This medicine can help reduce diarrhea, vomiting, and abdominal cramping. It is available over-the-counter (OTC) in a drug store. * Adult dosage: Take two tablets or two tablespoons by mouth every hour (if diarrhea continues) to a maximum of 8 doses in a 24 hour period. * Do not use for more than 2 days.   7: CAUTION - Bismuth Subsalicylate (e.g., Kaopectate, Pepto-Bismol): * May cause a temporary  darkening of stool and tongue. * Do not use if allergic to aspirin. * Do not use in pregnancy.   Pt also instructed to contact her PCP; she verbalized understanding; the pt normally sees Dr Cletus Gash, [facility]; will route to provider for notification.

## 2018-09-25 NOTE — Telephone Encounter (Signed)
Notes in the chart indicate Joycelyn Man, PA-C has already responded to this patient.

## 2018-09-26 ENCOUNTER — Encounter (INDEPENDENT_AMBULATORY_CARE_PROVIDER_SITE_OTHER): Payer: Self-pay

## 2018-09-27 ENCOUNTER — Encounter (INDEPENDENT_AMBULATORY_CARE_PROVIDER_SITE_OTHER): Payer: Self-pay

## 2018-09-28 ENCOUNTER — Encounter (INDEPENDENT_AMBULATORY_CARE_PROVIDER_SITE_OTHER): Payer: Self-pay

## 2018-09-29 ENCOUNTER — Encounter (INDEPENDENT_AMBULATORY_CARE_PROVIDER_SITE_OTHER): Payer: Self-pay

## 2018-09-29 ENCOUNTER — Telehealth: Payer: Self-pay | Admitting: *Deleted

## 2018-09-29 NOTE — Telephone Encounter (Signed)
Contacted pt in due to her my chart companion response of "cough"; the pt says that it is a tickle in her throat, but she thought that it was related to her getting out doing more; she has used a her tessalon perles, and states that they have helped a little; pt advised to also try warm liquids and:   * OTC COUGH SYRUPS: The most common cough suppressant in OTC cough medications is dextromethorphan. Often the letters 'DM' appear in the name. * OTC COUGH DROPS: Cough drops can help a lot, especially for mild coughs. They reduce coughing by soothing your irritated throat and removing that tickle sensation in the back of the throat. Cough drops also have the advantage of portability - you can carry them with you. * HOME REMEDY - HARD CANDY: Hard candy works just as well as medicine-flavored OTC cough drops. People who have diabetes should use sugar-free candy. * HOME REMEDY - HONEY: This old home remedy has been shown to help decrease coughing at night. The adult dosage is 2 teaspoons (10 ml) at bedtime. Honey should not be given to infants under one year of age; pt also advised to contact her PCP for further recommendations; she verbalizes understanding; the pt normally sees Dr [name]; will route to provider for notification.

## 2018-09-30 ENCOUNTER — Encounter (INDEPENDENT_AMBULATORY_CARE_PROVIDER_SITE_OTHER): Payer: Self-pay

## 2018-10-01 ENCOUNTER — Encounter (INDEPENDENT_AMBULATORY_CARE_PROVIDER_SITE_OTHER): Payer: Self-pay

## 2018-10-02 ENCOUNTER — Encounter (INDEPENDENT_AMBULATORY_CARE_PROVIDER_SITE_OTHER): Payer: Self-pay

## 2018-10-03 ENCOUNTER — Encounter (INDEPENDENT_AMBULATORY_CARE_PROVIDER_SITE_OTHER): Payer: Self-pay

## 2018-10-08 ENCOUNTER — Encounter: Payer: Self-pay | Admitting: Family Medicine

## 2018-10-08 ENCOUNTER — Other Ambulatory Visit: Payer: Self-pay | Admitting: Family Medicine

## 2018-10-08 DIAGNOSIS — G4700 Insomnia, unspecified: Secondary | ICD-10-CM

## 2018-10-08 DIAGNOSIS — F439 Reaction to severe stress, unspecified: Secondary | ICD-10-CM

## 2018-10-13 NOTE — Telephone Encounter (Signed)
Received refill request for zolpidem, but this was supposed to have been replaced by trazodone when she was seen in December, she is not supposed to be taking both. Please check and see if she is still taking trazodone. Also please verify what dose of amitriptyline she is taking, I thinks we had planned on her cutting down to 1/2 tablet at her last visit.

## 2018-10-20 NOTE — Telephone Encounter (Signed)
Pt says she does not need the Zolpidem she has been using the Trazodone.    Pt is taking a 1/2 of amitriptyline but would like to go back up to a full tablet.  Pt states she is having more headaches.    Please advise.    Thanks,   -Vernona Rieger

## 2019-01-26 ENCOUNTER — Other Ambulatory Visit: Payer: Self-pay | Admitting: Family Medicine

## 2019-01-26 DIAGNOSIS — F439 Reaction to severe stress, unspecified: Secondary | ICD-10-CM

## 2019-02-09 ENCOUNTER — Encounter: Payer: Self-pay | Admitting: Gynecology

## 2019-03-29 ENCOUNTER — Other Ambulatory Visit: Payer: Self-pay | Admitting: Family Medicine

## 2019-03-29 DIAGNOSIS — F439 Reaction to severe stress, unspecified: Secondary | ICD-10-CM

## 2019-03-29 MED ORDER — SERTRALINE HCL 100 MG PO TABS: 100.0000 mg | ORAL_TABLET | Freq: Every day | ORAL | 4 refills | Status: DC

## 2019-03-29 NOTE — Telephone Encounter (Signed)
Last OV 09/21/2018 and last RF 01/26/2019 to [facility].

## 2019-03-29 NOTE — Telephone Encounter (Signed)
[facility] faxed refill request for the following medications:  sertraline (ZOLOFT) 100 MG tablet   Please advise.

## 2019-06-10 ENCOUNTER — Encounter: Payer: BC Managed Care – PPO | Admitting: Obstetrics and Gynecology

## 2019-06-21 ENCOUNTER — Other Ambulatory Visit: Payer: Self-pay | Admitting: Family Medicine

## 2019-06-21 NOTE — Telephone Encounter (Signed)
Requested medication (s) are due for refill today: yes  Requested medication (s) are on the active medication list: yes  Last refill:  03/12/2019  Future visit scheduled: no  Notes to clinic:  no valid encounter within last 6 months   Requested Prescriptions  Pending Prescriptions Disp Refills   amitriptyline (ELAVIL) 100 MG tablet [Pharmacy Med Name: AMITRIPTYLIN TAB 100MG ] 90 tablet 3    Sig: TAKE 1 TABLET DAILY      Psychiatry:  Antidepressants - Heterocyclics (TCAs) Failed - 06/21/2019  8:10 AM      Failed - Valid encounter within last 6 months    Recent Outpatient Visits           9 months ago Suspected Covid-19 Virus Infection   [facility] [place], [place], Alessandra Bevels   1 year ago Situational stress   [facility], [place]   1 year ago Situational stress   [facility], [place]   1 year ago Vertigo   [facility], [place]   1 year ago Migraine syndrome   [facility], [place]

## 2019-07-07 ENCOUNTER — Encounter: Payer: Self-pay | Admitting: Family Medicine

## 2019-07-07 NOTE — Progress Notes (Signed)
Patient: Kristen [name] Female    DOB: [birth year]   39 y.o.   MRN: [id number] Visit Date: 07/09/2019  Today's Provider: [name], MD   Chief Complaint  Patient presents with  . Migraine  . Anxiety   Subjective:    Virtual Visit via Video Note  I connected with Kristen [name] on 07/09/19 at 10:40 AM EST by a video enabled telemedicine application and verified that I am speaking with the correct person using two identifiers.   I discussed the limitations of evaluation and management by telemedicine and the availability of in person appointments. The patient expressed understanding and agreed to proceed.    HPI  Follow up for Migraines:  The patient was last seen for this more than 1 year ago. Changes made at last visit include continue amitriptyline.  She reports good compliance with treatment. She feels that condition is Improved. She is not having side effects.   States has had about 3 migraines in the last year which she treated with ibuprofen and lying down in a dark room for a few hours.   ------------------------------------------------------------------------------------  Follow up for Anxiety:  The patient was last seen for this more than 1 year ago. Changes made at last visit include continuing the same medication regiement  She reports good compliance with treatment. She feels that condition is Improved. She is not having side effects.  She feels the sertraline is working very well and wishes to continue same dose. She does take alprazolam prn, which is usually several times in a week, but not necessarily every day.  ------------------------------------------------------------------------------------  Allergies  Allergen Reactions  . Imitrex [Sumatriptan] Nausea And Vomiting     Current Outpatient Medications:  .  ALPRAZolam (XANAX) 1 MG tablet, TAKE HALF TO ONE TABLET BY MOUTH EVERY 4HOURS AS NEEDED FOR ANXIETY, Disp: 30 tablet, Rfl: 3 .   amitriptyline (ELAVIL) 100 MG tablet, Take 1 tablet (100 mg total) by mouth daily., Disp: 90 tablet, Rfl: 3 .  Calcium Carb-Cholecalciferol (CALCIUM 500 +D) 500-400 MG-UNIT TABS, Take 1 tablet by mouth daily., Disp: , Rfl:  .  levonorgestrel (MIRENA) 20 MCG/24HR IUD, 1 each by Intrauterine route once.  , Disp: , Rfl:  .  Multiple Vitamins-Minerals (MULTIVITAMIN WITH MINERALS) tablet, Take 1 tablet by mouth daily.  , Disp: , Rfl:  .  pantoprazole (PROTONIX) 40 MG tablet, TAKE 1 TABLET BY MOUTH EVERY DAY, Disp: 30 tablet, Rfl: 2 .  sertraline (ZOLOFT) 100 MG tablet, Take 1 tablet (100 mg total) by mouth daily., Disp: 90 tablet, Rfl: 4 .  traZODone (DESYREL) 100 MG tablet, Take 1 tablet (100 mg total) by mouth at bedtime., Disp: 30 tablet, Rfl: 6 .  Thermometer MISC, To check temperature BID (Patient not taking: Reported on 07/07/2019), Disp: 1 each, Rfl: 0  Review of Systems  Social History   Tobacco Use  . Smoking status: Never Smoker  . Smokeless tobacco: Never Used  Substance Use Topics  . Alcohol use: Yes    Alcohol/week: 0.0 standard drinks    Comment: Rare      Objective:   There were no vitals taken for this visit. There were no vitals filed for this visit.There is no height or weight on file to calculate BMI.   Physical Exam  Awake, alert, oriented x 3. In no apparent distress  No results found for any visits on 07/09/19.     Assessment & Plan    1. Chronic anxiety  Very well controlled on current medications which she wishes continue. Advised of potential withdrawal symptoms associated with sudden d/c of SSRIs. She has no plans to stop medication for now and understands to contact HCP before doing so in the future.   - ALPRAZolam (XANAX) 1 MG tablet; TAKE HALF TO ONE TABLET BY MOUTH EVERY 4HOURS AS NEEDED FOR ANXIETY  Dispense: 30 tablet; Refill: 3 - sertraline (ZOLOFT) 100 MG tablet; Take 1 tablet (100 mg total) by mouth daily.  Dispense: 90 tablet; Refill: 4  2.  Panic disorder without agoraphobia Well controlled. Continue current medications.  refill - ALPRAZolam (XANAX) 1 MG tablet; TAKE HALF TO ONE TABLET BY MOUTH EVERY 4HOURS AS NEEDED FOR ANXIETY  Dispense: 30 tablet; Refill: 3 - sertraline (ZOLOFT) 100 MG tablet; Take 1 tablet (100 mg total) by mouth daily.  Dispense: 90 tablet; Refill: 4  3. Migraine without aura and without status migrainosus, not intractable Well controlled on current TCA. refill   The entirety of the information documented in the History of Present Illness, Review of Systems and Physical Exam were personally obtained by me. Portions of this information were initially documented by Meyer Cory, CMA and reviewed by me for thoroughness and accuracy.      I discussed the assessment and treatment plan with the patient. The patient was provided an opportunity to ask questions and all were answered. The patient agreed with the plan and demonstrated an understanding of the instructions.   The patient was advised to call back or seek an in-person evaluation if the symptoms worsen or if the condition fails to improve as anticipated.  I provided 10 minutes of non-face-to-face time during this encounter.    Lelon Huh, MD  [facility]

## 2019-07-09 ENCOUNTER — Encounter: Payer: Self-pay | Admitting: Family Medicine

## 2019-07-09 ENCOUNTER — Telehealth (INDEPENDENT_AMBULATORY_CARE_PROVIDER_SITE_OTHER): Payer: BC Managed Care – PPO | Admitting: Family Medicine

## 2019-07-09 DIAGNOSIS — F410 Panic disorder [episodic paroxysmal anxiety] without agoraphobia: Secondary | ICD-10-CM

## 2019-07-09 DIAGNOSIS — G43909 Migraine, unspecified, not intractable, without status migrainosus: Secondary | ICD-10-CM | POA: Insufficient documentation

## 2019-07-09 DIAGNOSIS — F419 Anxiety disorder, unspecified: Secondary | ICD-10-CM | POA: Diagnosis not present

## 2019-07-09 DIAGNOSIS — G43009 Migraine without aura, not intractable, without status migrainosus: Secondary | ICD-10-CM | POA: Diagnosis not present

## 2019-07-09 MED ORDER — ALPRAZOLAM 1 MG PO TABS: ORAL_TABLET | ORAL | 3 refills | Status: DC

## 2019-07-09 MED ORDER — SERTRALINE HCL 100 MG PO TABS: 100.0000 mg | ORAL_TABLET | Freq: Every day | ORAL | 4 refills | Status: DC

## 2019-07-13 ENCOUNTER — Telehealth: Payer: Self-pay

## 2019-07-13 ENCOUNTER — Encounter: Payer: Self-pay | Admitting: Family Medicine

## 2019-07-13 NOTE — Telephone Encounter (Signed)
Copied from CRM [id number]. Topic: General - Inquiry >> Jul 13, 2019  3:26 PM Reggie Pile, [place] wrote: Reason for CRM: Patient called in stating she is returning a call from [place]. Please advise.

## 2019-07-29 ENCOUNTER — Encounter: Payer: Self-pay | Admitting: Family Medicine

## 2019-07-29 ENCOUNTER — Telehealth: Payer: Self-pay

## 2019-07-29 MED ORDER — AMITRIPTYLINE HCL 100 MG PO TABS: 100.0000 mg | ORAL_TABLET | Freq: Every day | ORAL | 2 refills | Status: DC

## 2019-07-29 NOTE — Telephone Encounter (Signed)
Refill sent to [facility].

## 2019-08-17 ENCOUNTER — Other Ambulatory Visit: Payer: Self-pay

## 2019-08-18 ENCOUNTER — Ambulatory Visit (INDEPENDENT_AMBULATORY_CARE_PROVIDER_SITE_OTHER): Payer: BC Managed Care – PPO | Admitting: Obstetrics and Gynecology

## 2019-08-18 ENCOUNTER — Encounter: Payer: Self-pay | Admitting: Obstetrics and Gynecology

## 2019-08-18 VITALS — BP 118/74 | Ht 66.0 in | Wt 182.0 lb

## 2019-08-18 DIAGNOSIS — E559 Vitamin D deficiency, unspecified: Secondary | ICD-10-CM | POA: Diagnosis not present

## 2019-08-18 DIAGNOSIS — Z13220 Encounter for screening for lipoid disorders: Secondary | ICD-10-CM

## 2019-08-18 DIAGNOSIS — Z01419 Encounter for gynecological examination (general) (routine) without abnormal findings: Secondary | ICD-10-CM | POA: Diagnosis not present

## 2019-08-18 LAB — LIPID PANEL
Cholesterol: 187 mg/dL (ref <200)
HDL: 62 mg/dL (ref >=50)
LDL Cholesterol (Calc): 110 mg/dL (calc) — ABNORMAL HIGH
Non-HDL Cholesterol (Calc): 125 mg/dL (calc) (ref <130)
Total CHOL/HDL Ratio: 3 (calc) (ref <5.0)
Triglycerides: 60 mg/dL (ref <150)

## 2019-08-18 LAB — COMPREHENSIVE METABOLIC PANEL
AG Ratio: 1.8 (calc) (ref 1.0–2.5)
ALT: 13 U/L (ref 6–29)
AST: 15 U/L (ref 10–30)
Albumin: 4.2 g/dL (ref 3.6–5.1)
Alkaline phosphatase (APISO): 55 U/L (ref 31–125)
BUN: 13 mg/dL (ref 7–25)
CO2: 26 mmol/L (ref 20–32)
Calcium: 9.5 mg/dL (ref 8.6–10.2)
Chloride: 107 mmol/L (ref 98–110)
Creat: 0.64 mg/dL (ref 0.50–1.10)
Globulin: 2.3 g/dL (calc) (ref 1.9–3.7)
Glucose, Bld: 89 mg/dL (ref 65–99)
Potassium: 4.6 mmol/L (ref 3.5–5.3)
Sodium: 138 mmol/L (ref 135–146)
Total Bilirubin: 0.5 mg/dL (ref 0.2–1.2)
Total Protein: 6.5 g/dL (ref 6.1–8.1)

## 2019-08-18 LAB — CBC
HCT: 39.1 % (ref 35.0–45.0)
Hemoglobin: 12.9 g/dL (ref 11.7–15.5)
MCH: 31.6 pg (ref 27.0–33.0)
MCHC: 33 g/dL (ref 32.0–36.0)
MCV: 95.8 fL (ref 80.0–100.0)
MPV: 9.6 fL (ref 7.5–12.5)
Platelets: 292 10*3/uL (ref 140–400)
RBC: 4.08 10*6/uL (ref 3.80–5.10)
RDW: 11.6 % (ref 11.0–15.0)
WBC: 5 10*3/uL (ref 3.8–10.8)

## 2019-08-18 LAB — VITAMIN D 25 HYDROXY (VIT D DEFICIENCY, FRACTURES): Vit D, 25-Hydroxy: 15 ng/mL — ABNORMAL LOW (ref 30–100)

## 2019-08-18 NOTE — Progress Notes (Addendum)
   Kristen [name] [birth year] [id number]  SUBJECTIVE:  39 y.o. G0P0 female for annual routine gynecologic exam. She has no gynecologic concerns.  Current Outpatient Medications  Medication Sig Dispense Refill  . ALPRAZolam (XANAX) 1 MG tablet TAKE HALF TO ONE TABLET BY MOUTH EVERY 4HOURS AS NEEDED FOR ANXIETY 30 tablet 3  . amitriptyline (ELAVIL) 100 MG tablet Take 1 tablet (100 mg total) by mouth daily. 90 tablet 2  . Calcium Carb-Cholecalciferol (CALCIUM 500 +D) 500-400 MG-UNIT TABS Take 1 tablet by mouth daily.    Marland Kitchen levonorgestrel (MIRENA) 20 MCG/24HR IUD 1 each by Intrauterine route once.      . Multiple Vitamins-Minerals (MULTIVITAMIN WITH MINERALS) tablet Take 1 tablet by mouth daily.      . pantoprazole (PROTONIX) 40 MG tablet TAKE 1 TABLET BY MOUTH EVERY DAY 30 tablet 2  . sertraline (ZOLOFT) 100 MG tablet Take 1 tablet (100 mg total) by mouth daily. 90 tablet 4   No current facility-administered medications for this visit.   Allergies: Imitrex [sumatriptan]  No LMP recorded. (Menstrual status: IUD).    Past medical history,surgical history, problem list, medications, allergies, family history and social history were all reviewed and documented as reviewed in the EPIC chart.  ROS:  Feeling well. No dyspnea or chest pain on exertion.  No abdominal pain, change in bowel habits, black or bloody stools.  No urinary tract symptoms. GYN ROS: minimal menses (on IUD), no abnormal bleeding, pelvic pain or discharge, no breast pain or new or enlarging lumps on self exam. No neurological complaints.   OBJECTIVE:  Ht 5\' 6"  (1.676 m)   Wt 182 lb (82.6 kg)   BMI 29.38 kg/m  The patient appears well, alert, oriented x 3, in no distress. ENT normal.  Neck supple. No cervical or supraclavicular adenopathy or thyromegaly.  Lungs are clear, good air entry, no wheezes, rhonchi or rales. S1 and S2 normal, no murmurs, regular rate and rhythm.  Abdomen soft without tenderness, guarding, mass or  organomegaly.  Neurological is normal, no focal findings.  BREAST EXAM: breasts appear normal, no suspicious masses, no skin or nipple changes or axillary nodes  PELVIC EXAM: VULVA: normal appearing vulva with no masses, tenderness or lesions, VAGINA: normal appearing vagina with normal color and discharge, no lesions, CERVIX: normal appearing cervix without discharge or lesions, IUD string palpated, UTERUS: uterus is normal size, shape, consistency and nontender, ADNEXA: normal adnexa in size, nontender and no masses  Chaperone: present during the examination  ASSESSMENT:  39 y.o. G0P0 here for annual gynecologic exam  PLAN:   1. No menstrual or hormonal concerns.  2. Pap smear/HPV 05/2016. Next Pap smear due 2023 according to current guidelines calling for the 5-year interval for cotesting. 3. Contraception. Mirena IUD placed 01/2015. Due for removal this September and she will plan to schedule accordingly. 4. Vitamin D deficiency.  Check vitamin D level today.  She has only been taking a daily multivitamin. 5. Normal breast exam.  Encouraged breast self awareness and notify October of any concerning changes. 6.  Declines need for any refills.  She has her medications prescribed by her primary care doctor. 7. Health maintenance.  Routine screening blood work (lipids, CBC, CMP, vitamin D level) will be obtained today.  Return 6 months for IUD removal, or sooner, prn.  [place] MD, FACOG  08/18/19

## 2020-01-21 ENCOUNTER — Ambulatory Visit (INDEPENDENT_AMBULATORY_CARE_PROVIDER_SITE_OTHER): Payer: BC Managed Care – PPO | Admitting: Obstetrics and Gynecology

## 2020-01-21 ENCOUNTER — Encounter: Payer: Self-pay | Admitting: Obstetrics and Gynecology

## 2020-01-21 ENCOUNTER — Other Ambulatory Visit: Payer: Self-pay

## 2020-01-21 VITALS — BP 118/80

## 2020-01-21 DIAGNOSIS — Z30433 Encounter for removal and reinsertion of intrauterine contraceptive device: Secondary | ICD-10-CM

## 2020-01-21 DIAGNOSIS — Z30432 Encounter for removal of intrauterine contraceptive device: Secondary | ICD-10-CM

## 2020-01-21 DIAGNOSIS — Z30430 Encounter for insertion of intrauterine contraceptive device: Secondary | ICD-10-CM

## 2020-01-21 NOTE — Progress Notes (Signed)
   Kristen [name]  1981-01-12 [id number]  HPI The patient is a 39 y.o. G0P0 who presents today for Mirena IUD removal and reinsertion of new IUD. Considerations with the Mirena IUD include the initial period of time after insertion to expect cramping and/or abnormal bleeding, especially in the first 3-6 months use.  Approved for 5 years of contraception.  After this time period the period can get very light or some women become amenorrheic.  There are potential hormonal side effects.  Insertion risks include infection, uterine perforation and device migration into the abdomen which would require surgical removal, or the device can become dislodged causing pain or fall out of which would preclude effective contraception. Monthly self string checks (or more frequent) are recommended as IUD expulsion does occur.  She wanted to proceed with the Mirena IUD insertion and provided written consent.   Physical Exam  BP 118/80 (BP Location: Right Arm, Patient Position: Sitting, Cuff Size: Normal)   LMP  (LMP Unknown)   General: Pleasant female, no acute distress, alert and oriented PELVIC EXAM: VULVA: normal appearing vulva with no masses, tenderness or lesions, VAGINA:  normal appearing vagina with normal color and discharge, no lesions, CERVIX: normal appearing cervix without discharge or lesions, IUD string present just inside of the external os.  Procedure note Mirena IUD removal and insertion A Cytobrush was used with the cervix in view to help retrieve the IUD string which was grasped with a [place] forceps and location of gentle traction resulted in easy removal of the device which was intact as demonstrated to staff. The cervix was cleansed with a Betadine swab.  The anterior lip of the cervix was grasped with an Allis clamp.  Endometrial pipelle was used to obtain a sound length of 8 cm.  Maintaining sterility, the flange on the IUD insertion tube was set to the appropriate length and the insertion  tube was inserted into the cervix and endometrial cavity without difficulty until gentle fundal resistance was met.  The arms of the IUD were deployed and the Mirena was inserted without difficulty. The strings were trimmed to a length of 2 cm.  The patient tolerated the procedure well without any complication.  KimAlexis Bonham present for exam and procedure  Joseph Pierini MD 01/21/20

## 2020-01-25 ENCOUNTER — Other Ambulatory Visit: Payer: Self-pay | Admitting: Family Medicine

## 2020-01-25 DIAGNOSIS — F410 Panic disorder [episodic paroxysmal anxiety] without agoraphobia: Secondary | ICD-10-CM

## 2020-01-25 DIAGNOSIS — F419 Anxiety disorder, unspecified: Secondary | ICD-10-CM

## 2020-01-25 NOTE — Telephone Encounter (Signed)
Requested medication (s) are due for refill today: yes  Requested medication (s) are on the active medication list: yes  Last refill:  07/09/19 #30  Future visit scheduled: no  Notes to clinic:  Please review for refill. Not delegated per protocol    Requested Prescriptions  Pending Prescriptions Disp Refills   ALPRAZolam (XANAX) 1 MG tablet [Pharmacy Med Name: ALPRAZOLAM TAB 1MG ] 30 tablet 3    Sig: TAKE 1/2 TO 1 TABLET EVERY 4 HOURS AS NEEDED FOR      ANXIETY      Not Delegated - Psychiatry:  Anxiolytics/Hypnotics Failed - 01/25/2020 10:34 AM      Failed - This refill cannot be delegated      Failed - Urine Drug Screen completed in last 360 days.      Failed - Valid encounter within last 6 months    Recent Outpatient Visits           6 months ago Chronic anxiety   [facility], [place]   1 year ago Suspected Covid-19 Virus Infection   [facility] [place], [place], [place]andra Bevels   1 year ago Situational stress   [facility], [place]   1 year ago Situational stress   [facility], [place]   2 years ago Vertigo   [facility], [place]       Future Appointments             In 6 months [name], MD [facility], [facility]

## 2020-02-02 ENCOUNTER — Encounter: Payer: Self-pay | Admitting: Gynecology

## 2020-02-13 ENCOUNTER — Encounter: Payer: Self-pay | Admitting: Family Medicine

## 2020-04-11 ENCOUNTER — Other Ambulatory Visit: Payer: Self-pay | Admitting: Family Medicine

## 2020-04-11 NOTE — Telephone Encounter (Signed)
Requested medication (s) are due for refill today: no  Requested medication (s) are on the active medication list: yes   Last refill:  01/25/2020  Future visit scheduled:no  Notes to clinic:  overdue for 6 month follow up Vm left for patient to call and schedule    Requested Prescriptions  Pending Prescriptions Disp Refills   amitriptyline (ELAVIL) 100 MG tablet [Pharmacy Med Name: AMITRIPTYLIN TAB 100MG ] 90 tablet 2    Sig: TAKE 1 TABLET DAILY      Psychiatry:  Antidepressants - Heterocyclics (TCAs) Failed - 04/11/2020  2:28 AM      Failed - Valid encounter within last 6 months    Recent Outpatient Visits           9 months ago Chronic anxiety   [facility], [place]   1 year ago Suspected Covid-19 Virus Infection   [facility] [place], [place], [place]   1 year ago Situational stress   [facility], [place]   2 years ago Situational stress   [facility], [place]   2 years ago Vertigo   [facility], [place]       Future Appointments             In 4 months [name], MD [facility], [facility]

## 2020-04-17 ENCOUNTER — Telehealth: Payer: Self-pay

## 2020-04-17 NOTE — Telephone Encounter (Signed)
Copied from CRM [id number]. Topic: Appointment Scheduling - Scheduling Inquiry for Clinic >> Apr 17, 2020  3:21 PM Daphine Deutscher D wrote: Reason for CRM: Pt has an appt tomorrow am at 8:20 and she would like to make that a my-chart visit  I changed it in appts but wanted to give a heads up.  Please call pat and let her know if this is ok

## 2020-04-18 ENCOUNTER — Encounter: Payer: Self-pay | Admitting: Family Medicine

## 2020-04-18 ENCOUNTER — Telehealth: Payer: BC Managed Care – PPO | Admitting: Family Medicine

## 2020-04-18 DIAGNOSIS — F410 Panic disorder [episodic paroxysmal anxiety] without agoraphobia: Secondary | ICD-10-CM | POA: Diagnosis not present

## 2020-04-18 DIAGNOSIS — F419 Anxiety disorder, unspecified: Secondary | ICD-10-CM

## 2020-04-18 DIAGNOSIS — K219 Gastro-esophageal reflux disease without esophagitis: Secondary | ICD-10-CM | POA: Diagnosis not present

## 2020-04-18 MED ORDER — ESOMEPRAZOLE MAGNESIUM 40 MG PO CPDR: 40.0000 mg | DELAYED_RELEASE_CAPSULE | Freq: Every day | ORAL | 3 refills | Status: DC

## 2020-04-18 MED ORDER — ALPRAZOLAM 1 MG PO TABS: ORAL_TABLET | ORAL | 1 refills | Status: DC

## 2020-04-18 NOTE — Progress Notes (Signed)
MyChart Video Visit    Virtual Visit via Video Note   This visit type was conducted due to national recommendations for restrictions regarding the COVID-19 Pandemic (e.g. social distancing) in an effort to limit this patient's exposure and mitigate transmission in our community. This patient is at least at moderate risk for complications without adequate follow up. This format is felt to be most appropriate for this patient at this time. Physical exam was limited by quality of the video and audio technology used for the visit.   Patient location: home Provider location: [facility]  I discussed the limitations of evaluation and management by telemedicine and the availability of in person appointments. The patient expressed understanding and agreed to proceed.  Patient: Kristen [name]   DOB: 13-Jun-1980   39 y.o. Female  MRN: [id number] Visit Date: 04/18/2020  Today's healthcare provider: [name], MD   Chief Complaint  Patient presents with  . Depression  . Anxiety   Subjective    HPI  Anxiety and Depression, Follow-up  Recent changes were made on 02/13/2020. Patient was advised to try increasing Sertraline to 150mg  for 2-3 weeks. If that didn't help, patient was advised that she could try increasing to 200mg  daily.    She reports good compliance with treatment. She has been taking 150mg  of Sertraline daily.  She reports good tolerance of treatment. She is not having side effects.   She feels her anxiety is moderate and Improved since last visit. She feels that the depression is much better since increasing sertraline. She does take at least alprazolam most days, and occasionally a second or third tablet in a day. She also finds that exercising and walking helps with her stress level. She would like to continue her current medication regiment.   Symptoms: No chest pain No difficulty concentrating  No dizziness No fatigue  No feelings of losing control No insomnia  No  irritable No palpitations  No panic attacks No racing thoughts  No shortness of breath No sweating  No tremors/shakes    GAD-7 Results GAD-7 Generalized Anxiety Disorder Screening Tool 04/18/2020  1. Feeling Nervous, Anxious, or on Edge 2  2. Not Being Able to Stop or Control Worrying 2  3. Worrying Too Much About Different Things 3  4. Trouble Relaxing 3  5. Being So Restless it's Hard To Sit Still 0  6. Becoming Easily Annoyed or Irritable 0  7. Feeling Afraid As If Something Awful Might Happen 0  Total GAD-7 Score 10  Difficulty At Work, Home, or Getting  Along With Others? Not difficult at all    PHQ-9 Scores PHQ9 SCORE ONLY 04/18/2020 07/07/2019 01/01/2018  PHQ-9 Total Score 0 0 0    --------------------------------------------------------------------------------------------------- Also reflux symptoms have gotten worse over the last several months, is taking OTC famotidine. She had taken pantoprazole in the past which was too expensive and wasn't really working well.      Review of Systems  Constitutional: Negative for appetite change, chills, fatigue and fever.  Respiratory: Negative for chest tightness and shortness of breath.   Cardiovascular: Negative for chest pain and palpitations.  Gastrointestinal: Negative for abdominal pain, nausea and vomiting.  Neurological: Negative for dizziness and weakness.  Psychiatric/Behavioral: The patient is nervous/anxious.       Objective    There were no vitals taken for this visit.   Physical Exam   Awake, alert, oriented x 3. In no apparent distress   Assessment & Plan  1. Chronic anxiety Her overall mood is much better since increasing sertraline to 1 1/2 x 100mg  tablets daily. She feels her anxiety is manageable, usually taking one alprazolam a day, occasionally taking a second or third tablet. refill- ALPRAZolam (XANAX) 1 MG tablet; TAKE 1/2 TO 1 TABLET EVERY 4 HOURS AS NEEDED FOR      ANXIETY  Dispense: 90  tablet; Refill: 1  2. Panic disorder without agoraphobia Doing well on current medication regiment.    3. Gastroesophageal reflux disease without esophagitis Uncontrolled with OTC medications. Previously taken pantoprazole which also was not completely controlling reflux symptoms. try- esomeprazole (NEXIUM) 40 MG capsule; Take 1 capsule (40 mg total) by mouth daily.  Dispense: 90 capsule; Refill: 3   No follow-ups on file.     I discussed the assessment and treatment plan with the patient. The patient was provided an opportunity to ask questions and all were answered. The patient agreed with the plan and demonstrated an understanding of the instructions.   The patient was advised to call back or seek an in-person evaluation if the symptoms worsen or if the condition fails to improve as anticipated.  I provided 11 minutes of non-face-to-face time during this encounter.  The entirety of the information documented in the History of Present Illness, Review of Systems and Physical Exam were personally obtained by me. Portions of this information were initially documented by the CMA and reviewed by me for thoroughness and accuracy.     , MD [facility] [phone] (phone) [phone] (fax)  [facility]

## 2020-05-02 ENCOUNTER — Encounter: Payer: Self-pay | Admitting: Family Medicine

## 2020-05-02 ENCOUNTER — Telehealth (INDEPENDENT_AMBULATORY_CARE_PROVIDER_SITE_OTHER): Payer: BC Managed Care – PPO | Admitting: Family Medicine

## 2020-05-02 DIAGNOSIS — M791 Myalgia, unspecified site: Secondary | ICD-10-CM | POA: Diagnosis not present

## 2020-05-02 DIAGNOSIS — R070 Pain in throat: Secondary | ICD-10-CM

## 2020-05-02 DIAGNOSIS — H9201 Otalgia, right ear: Secondary | ICD-10-CM | POA: Diagnosis not present

## 2020-05-02 DIAGNOSIS — R519 Headache, unspecified: Secondary | ICD-10-CM | POA: Diagnosis not present

## 2020-05-02 MED ORDER — PROMETHAZINE HCL 25 MG RE SUPP: 25.0000 mg | Freq: Four times a day (QID) | RECTAL | 0 refills | Status: DC | PRN

## 2020-05-02 MED ORDER — AMOXICILLIN 500 MG PO CAPS: 1000.0000 mg | ORAL_CAPSULE | Freq: Two times a day (BID) | ORAL | 0 refills | Status: AC

## 2020-05-02 MED ORDER — OSELTAMIVIR PHOSPHATE 75 MG PO CAPS: 75.0000 mg | ORAL_CAPSULE | Freq: Two times a day (BID) | ORAL | 0 refills | Status: AC

## 2020-05-02 NOTE — Progress Notes (Addendum)
Established patient visit   Patient: Kristen [name]   DOB: 05-27-1980   39 y.o. Female  MRN: [id number] Visit Date: 05/02/2020  Today's healthcare provider: [name], MD   Chief Complaint  Patient presents with  . URI   Virtual Visit via Video Note  I connected with Arlyce Dice on 05/14/20 at  1:00 PM EST by a video enabled telemedicine application and verified that I am speaking with the correct person using two identifiers.  Patient location: Home Provider location: [facility]  Subjective    URI  This is a new problem. The current episode started yesterday. The problem has been gradually worsening. There has been no fever. Associated symptoms include ear pain (right ear), sinus pain and a sore throat. Pertinent negatives include no abdominal pain, chest pain, congestion, coughing, nausea, rhinorrhea, sneezing or vomiting. She has tried NSAIDs for the symptoms. The treatment provided moderate relief.   She has had initial Covid series, but not booster or flu shot.       Medications: Outpatient Medications Prior to Visit  Medication Sig  . ALPRAZolam (XANAX) 1 MG tablet TAKE 1/2 TO 1 TABLET EVERY 4 HOURS AS NEEDED FOR      ANXIETY  . amitriptyline (ELAVIL) 100 MG tablet TAKE 1 TABLET DAILY  . Calcium Carb-Cholecalciferol 500-400 MG-UNIT TABS Take 1 tablet by mouth daily.  Marland Kitchen esomeprazole (NEXIUM) 40 MG capsule Take 1 capsule (40 mg total) by mouth daily.  Marland Kitchen levonorgestrel (MIRENA) 20 MCG/24HR IUD 1 each by Intrauterine route once.  . Multiple Vitamins-Minerals (MULTIVITAMIN WITH MINERALS) tablet Take 1 tablet by mouth daily.  . sertraline (ZOLOFT) 100 MG tablet Take 1 tablet (100 mg total) by mouth daily.   No facility-administered medications prior to visit.    Review of Systems  Constitutional: Positive for chills, diaphoresis and fatigue. Negative for appetite change and fever.  HENT: Positive for ear pain (right ear), sinus pressure, sinus pain and sore  throat. Negative for congestion, nosebleeds, postnasal drip, rhinorrhea, sneezing and voice change.   Respiratory: Negative for cough, chest tightness and shortness of breath.   Cardiovascular: Negative for chest pain and palpitations.  Gastrointestinal: Negative for abdominal pain, nausea and vomiting.  Musculoskeletal: Positive for myalgias.  Neurological: Positive for dizziness. Negative for weakness.      Objective    There were no vitals taken for this visit.   Physical Exam  Awake, alert, oriented x 3. In no apparent distress   No results found for any visits on 05/02/20.  Assessment & Plan     1. Right ear pain  - amoxicillin (AMOXIL) 500 MG capsule; Take 2 capsules (1,000 mg total) by mouth 2 (two) times daily for 7 days.  Dispense: 28 capsule; Refill: 0   2. Myalgia  - Coronavirus (COVID-19) with Influenza A and Influenza B  3. Nonintractable headache, unspecified chronicity pattern, unspecified headache type  - Coronavirus (COVID-19) with Influenza A and Influenza B  4. Throat pain  - Coronavirus (COVID-19) with Influenza A and Influenza B - amoxicillin (AMOXIL) 500 MG capsule; Take 2 capsules (1,000 mg total) by mouth 2 (two) times daily for 7 days.  Dispense: 28 capsule; Refill: 0  - oseltamivir (TAMIFLU) 75 MG capsule; Take 1 capsule (75 mg total) by mouth 2 (two) times daily for 5 days.  Dispense: 10 capsule; Refill: 0 - promethazine (PHENERGAN) 25 MG suppository; Place 1 suppository (25 mg total) rectally every 6 (six) hours as needed for nausea  or vomiting.  Dispense: 12 each; Refill: 0  Call if symptoms change or if not rapidly improving.         The entirety of the information documented in the History of Present Illness, Review of Systems and Physical Exam were personally obtained by me. Portions of this information were initially documented by the CMA and reviewed by me for thoroughness and accuracy.      [name], MD  [facility] [phone] (phone) [phone] (fax)  [facility]

## 2020-05-03 ENCOUNTER — Encounter: Payer: Self-pay | Admitting: Family Medicine

## 2020-05-04 ENCOUNTER — Other Ambulatory Visit: Payer: Self-pay | Admitting: Family Medicine

## 2020-05-04 DIAGNOSIS — F419 Anxiety disorder, unspecified: Secondary | ICD-10-CM

## 2020-05-04 DIAGNOSIS — F410 Panic disorder [episodic paroxysmal anxiety] without agoraphobia: Secondary | ICD-10-CM

## 2020-05-04 MED ORDER — SERTRALINE HCL 100 MG PO TABS: 150.0000 mg | ORAL_TABLET | Freq: Every day | ORAL | 4 refills | Status: DC

## 2020-05-05 ENCOUNTER — Encounter: Payer: Self-pay | Admitting: Family Medicine

## 2020-05-05 LAB — COVID-19, FLU A+B NAA
Influenza A, NAA: NOT DETECTED
Influenza B, NAA: NOT DETECTED
SARS-CoV-2, NAA: NOT DETECTED

## 2020-05-08 ENCOUNTER — Other Ambulatory Visit: Payer: Self-pay | Admitting: General Surgery

## 2020-05-08 DIAGNOSIS — Z9884 Bariatric surgery status: Secondary | ICD-10-CM

## 2020-05-10 ENCOUNTER — Other Ambulatory Visit: Payer: Self-pay

## 2020-05-10 ENCOUNTER — Ambulatory Visit
Admission: RE | Admit: 2020-05-10 | Discharge: 2020-05-10 | Disposition: A | Discharge status: Home / Self Care | Payer: BC Managed Care – PPO | Source: Ambulatory Visit | Attending: General Surgery | Admitting: General Surgery

## 2020-05-10 DIAGNOSIS — Z9884 Bariatric surgery status: Secondary | ICD-10-CM | POA: Insufficient documentation

## 2020-05-16 ENCOUNTER — Other Ambulatory Visit: Payer: Self-pay | Admitting: Family Medicine

## 2020-05-16 DIAGNOSIS — G4700 Insomnia, unspecified: Secondary | ICD-10-CM

## 2020-06-21 ENCOUNTER — Other Ambulatory Visit: Payer: Self-pay | Admitting: Podiatry

## 2020-06-21 ENCOUNTER — Other Ambulatory Visit: Payer: Self-pay

## 2020-06-21 ENCOUNTER — Ambulatory Visit: Payer: BC Managed Care – PPO | Admitting: Podiatry

## 2020-06-21 ENCOUNTER — Encounter: Payer: Self-pay | Admitting: Podiatry

## 2020-06-21 DIAGNOSIS — B351 Tinea unguium: Secondary | ICD-10-CM

## 2020-06-21 MED ORDER — CICLOPIROX 8 % EX SOLN: Freq: Every day | CUTANEOUS | 3 refills | Status: DC

## 2020-06-21 MED ORDER — TERBINAFINE HCL 250 MG PO TABS: 250.0000 mg | ORAL_TABLET | Freq: Every day | ORAL | 0 refills | Status: DC

## 2020-06-21 NOTE — Progress Notes (Signed)
  Subjective:  Patient ID: Kristen [name], female    DOB: [birth year],  MRN: [id number]  Chief Complaint  Patient presents with  . Nail Problem    Patient presents today for nail fungus/discoloration of most nails bilat feet.  She noticed them turning about a month ago and is getting worse.  She denies any pain or discomfort    40 y.o. female presents with the above complaint. History confirmed with patient.   Objective:  Physical Exam: warm, good capillary refill, no trophic changes or ulcerative lesions, normal DP and PT pulses and normal sensory exam.  Superficial white onychomycosis 1 through 4 bilaterally with hallux being the worst     Assessment:   1. Onychomycosis      Plan:  Patient was evaluated and treated and all questions answered.   Discussed the etiology and treatment options for the condition in detail with the patient. Educated patient on the topical and oral treatment options for mycotic nails.  She would like to treat with oral and topical medications which is what I recommended.  Prescription for Penlac and Lamisil 90-day course sent to her pharmacy.  Advised take photos.  Return in 4 months for follow-up.  Discussed treatment options including appropriate shoe gear. Follow up as needed for painful nails.    Return in about 4 months (around 10/19/2020) for nail re-check.

## 2020-06-21 NOTE — Telephone Encounter (Signed)
Please advise 

## 2020-06-22 ENCOUNTER — Encounter: Payer: Self-pay | Admitting: Podiatry

## 2020-06-28 MED ORDER — TERBINAFINE HCL 250 MG PO TABS: 250.0000 mg | ORAL_TABLET | Freq: Every day | ORAL | 0 refills | Status: DC

## 2020-06-28 NOTE — Addendum Note (Signed)
Addended byLilian Kapur, Enrika Aguado R on: 06/28/2020 12:58 PM   Modules accepted: Orders

## 2020-07-05 MED ORDER — TERBINAFINE HCL 250 MG PO TABS: 250.0000 mg | ORAL_TABLET | Freq: Every day | ORAL | 0 refills | Status: DC

## 2020-07-05 MED ORDER — CICLOPIROX 8 % EX SOLN: Freq: Every day | CUTANEOUS | 3 refills | Status: AC

## 2020-07-11 ENCOUNTER — Other Ambulatory Visit: Payer: Self-pay | Admitting: Family Medicine

## 2020-07-11 DIAGNOSIS — F419 Anxiety disorder, unspecified: Secondary | ICD-10-CM

## 2020-07-11 DIAGNOSIS — F410 Panic disorder [episodic paroxysmal anxiety] without agoraphobia: Secondary | ICD-10-CM

## 2020-07-19 ENCOUNTER — Encounter: Payer: Self-pay | Admitting: Podiatry

## 2020-08-18 ENCOUNTER — Encounter: Payer: BC Managed Care – PPO | Admitting: Obstetrics and Gynecology

## 2020-08-28 NOTE — Progress Notes (Signed)
40 y.o. G0P0 Divorced White or Caucasian female here for annual exam.    Period Pattern:  (has IUD with spotting maybe 3 times a year) No LMP recorded. (Menstrual status: IUD).           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    The current method of family planning is IUD.   Mirena inserted 01-21-2020 Exercising: Yes.    walking 4 days a week Smoker:  no  Health Maintenance: Pap:  06-03-16 neg HPV HR neg History of abnormal Pap:  no MMG:  none Colonoscopy:  none BMD:   none Gardasil:   completed Covid-19: moderna Hep C testing: none    reports that she has never smoked. She has never used smokeless tobacco. She reports previous alcohol use. She reports that she does not use drugs.  Past Medical History:  Diagnosis Date  . Agoraphobia with panic attacks   . Anxiety   . Depression   . Dyslipidemia    not requiring meds  . Hernia   . Kidney stone   . Migraine headache     Past Surgical History:  Procedure Laterality Date  . HERNIA REPAIR  1985  . INTRAUTERINE DEVICE INSERTION  01/27/2015   Mirena  . LAPAROSCOPIC GASTRIC BANDING N/A 12/26/2012   Procedure: LAPAROSCOPIC GASTRIC BANDING REVISION ;  Surgeon: Gayland Curry, MD;  Location: [facility];  Service: General;  Laterality: N/A;  . TONSILLECTOMY AND ADENOIDECTOMY  2007    Current Outpatient Medications  Medication Sig Dispense Refill  . ALPRAZolam (XANAX) 1 MG tablet TAKE 1/2 TO 1 TABLET EVERY 4 HOURS AS NEEDED FOR      ANXIETY 90 tablet 1  . amitriptyline (ELAVIL) 100 MG tablet TAKE 1 TABLET DAILY 90 tablet 1  . Calcium Carb-Cholecalciferol 500-400 MG-UNIT TABS Take 1 tablet by mouth daily.    . ciclopirox (PENLAC) 8 % solution Apply topically at bedtime. Apply over nail and surrounding skin. Apply daily over previous coat. After seven (7) days, may remove with alcohol and continue cycle. 6 mL 3  . esomeprazole (NEXIUM) 40 MG capsule Take 1 capsule (40 mg total) by mouth daily. 90 capsule 3   . levonorgestrel (MIRENA) 20 MCG/24HR IUD 1 each by Intrauterine route once.    . Multiple Vitamins-Minerals (MULTIVITAMIN WITH MINERALS) tablet Take 1 tablet by mouth daily.    . pantoprazole (PROTONIX) 40 MG tablet Take 40 mg by mouth daily.    . sertraline (ZOLOFT) 100 MG tablet TAKE 1 TABLET DAILY 90 tablet 3  . terbinafine (LAMISIL) 250 MG tablet Take 1 tablet (250 mg total) by mouth daily. 90 tablet 0   No current facility-administered medications for this visit.    Family History  Problem Relation Age of Onset  . Hypertension Father   . Stroke Father   . Hyperlipidemia Father   . Migraines Mother   . Cancer Mother        cervical  . Hyperlipidemia Mother   . Bipolar disorder Sister   . Migraines Sister   . Breast cancer Paternal Grandmother 86    Review of Systems  Constitutional: Negative.   HENT: Negative.   Eyes: Negative.   Respiratory: Negative.   Cardiovascular: Negative.   Gastrointestinal: Negative.   Endocrine: Negative.   Genitourinary: Negative.   Musculoskeletal: Negative.   Skin: Negative.   Allergic/Immunologic: Negative.   Neurological: Negative.   Hematological: Negative.   Psychiatric/Behavioral: Negative.     Exam:  BP 104/68   Pulse 68   Resp 16   Ht 5' 5.5" (1.664 m)   Wt 177 lb (80.3 kg)   BMI 29.01 kg/m   Height: 5' 5.5" (166.4 cm)  General appearance: alert, cooperative and appears stated age, no acute distress Head: Normocephalic, without obvious abnormality Neck: no adenopathy, thyroid normal to inspection and palpation Lungs: clear to auscultation bilaterally Breasts: normal appearance, no masses or tenderness, No axillary or supraclavicular adenopathy Heart: regular rate and rhythm Abdomen: soft, non-tender; no masses,  no organomegaly, lap band port palpable RUQ Extremities: extremities normal, no edema Skin: No rashes or lesions Lymph nodes: Cervical, supraclavicular, and axillary nodes normal. No abnormal inguinal  nodes palpated Neurologic: Grossly normal   Pelvic: External genitalia:  no lesions              Urethra:  normal appearing urethra with no masses, tenderness or lesions              Bartholins and Skenes: normal                 Vagina: normal appearing vagina, appropriate for age, yellow appearing discharge, no lesions              Cervix: neg cervical motion tenderness, no visible lesions, red spots noted on cervix, strings palpable (difficult to visualize)             Bimanual Exam:   Uterus:  normal size, contour, position, consistency, mobility, non-tender              Adnexa: no mass, fullness, tenderness                 Joy, CMA Chaperone was present for exam.  A:  Well woman exam -   Hx Lap band Surgery  IUD check, inserted 01/21/2020, good until 01/2027  Plan: Cytology - PAP( [facility])  Vitamin D deficiency - Plan: VITAMIN D 25 Hydroxy (Vit-D Deficiency, Fractures)  Screening for lipid disorders - Plan: Lipid Profile  Screening for endocrine, metabolic and immunity disorder - Plan: Comp Met (CMET)  Screening, anemia, deficiency, iron - Plan: CBC  Vaginal discharge - Plan: WET PREP FOR [place], YEAST, CLUE  BV (bacterial vaginosis) - Plan: metroNIDAZOLE (FLAGYL) 500 MG tablet   P:   Pap : collected today  Mammogram: pt to schedule, number provided to [facility]  Labs:CBC, CMP, Vitamin D, Lipid panel

## 2020-08-29 ENCOUNTER — Other Ambulatory Visit: Payer: Self-pay

## 2020-08-29 ENCOUNTER — Other Ambulatory Visit (HOSPITAL_COMMUNITY)
Admission: RE | Admit: 2020-08-29 | Discharge: 2020-08-29 | Disposition: A | Discharge status: Home / Self Care | Payer: BC Managed Care – PPO | Source: Ambulatory Visit | Attending: Nurse Practitioner | Admitting: Nurse Practitioner

## 2020-08-29 ENCOUNTER — Encounter: Payer: Self-pay | Admitting: Nurse Practitioner

## 2020-08-29 ENCOUNTER — Ambulatory Visit (INDEPENDENT_AMBULATORY_CARE_PROVIDER_SITE_OTHER): Payer: BC Managed Care – PPO | Admitting: Nurse Practitioner

## 2020-08-29 VITALS — BP 104/68 | HR 68 | Resp 16 | Ht 65.5 in | Wt 177.0 lb

## 2020-08-29 DIAGNOSIS — E559 Vitamin D deficiency, unspecified: Secondary | ICD-10-CM

## 2020-08-29 DIAGNOSIS — Z01419 Encounter for gynecological examination (general) (routine) without abnormal findings: Secondary | ICD-10-CM | POA: Diagnosis present

## 2020-08-29 DIAGNOSIS — B9689 Other specified bacterial agents as the cause of diseases classified elsewhere: Secondary | ICD-10-CM

## 2020-08-29 DIAGNOSIS — Z13228 Encounter for screening for other metabolic disorders: Secondary | ICD-10-CM

## 2020-08-29 DIAGNOSIS — Z13220 Encounter for screening for lipoid disorders: Secondary | ICD-10-CM | POA: Diagnosis not present

## 2020-08-29 DIAGNOSIS — Z1329 Encounter for screening for other suspected endocrine disorder: Secondary | ICD-10-CM

## 2020-08-29 DIAGNOSIS — N760 Acute vaginitis: Secondary | ICD-10-CM

## 2020-08-29 DIAGNOSIS — Z130 Encounter for screening for diseases of the blood and blood-forming organs and certain disorders involving the immune mechanism: Secondary | ICD-10-CM

## 2020-08-29 DIAGNOSIS — N898 Other specified noninflammatory disorders of vagina: Secondary | ICD-10-CM

## 2020-08-29 LAB — WET PREP FOR TRICH, YEAST, CLUE

## 2020-08-29 MED ORDER — METRONIDAZOLE 500 MG PO TABS: 500.0000 mg | ORAL_TABLET | Freq: Two times a day (BID) | ORAL | 0 refills | Status: DC

## 2020-08-29 NOTE — Patient Instructions (Addendum)
Bacterial Vaginosis  Bacterial vaginosis is an infection that occurs when the normal balance of bacteria in the vagina changes. This change is caused by an overgrowth of certain bacteria in the vagina. Bacterial vaginosis is the most common vaginal infection among females aged 41 to 44 years. This condition increases the risk of sexually transmitted infections (STIs). Treatment can help reduce this risk. Treatment is very important for pregnant women because this condition can cause babies to be born early (prematurely) or at a low birth weight. What are the causes? This condition is caused by an increase in harmful bacteria that are normally present in small amounts in the vagina. However, the exact reason this condition develops is not known. You cannot get bacterial vaginosis from toilet seats, bedding, swimming pools, or contact with objects around you. What increases the risk? The following factors may make you more likely to develop this condition:  Having a new sexual partner or multiple sexual partners, or having unprotected sex.  Douching.  Having an intrauterine device (IUD).  Smoking.  Abusing drugs and alcohol. This may lead to riskier sexual behavior.  Taking certain antibiotic medicines.  Being pregnant. What are the signs or symptoms? Some women with this condition have no symptoms. Symptoms may include:  Gray or white vaginal discharge. The discharge can be watery or foamy.  A fish-like odor with discharge, especially after sex or during menstruation.  Itching in and around the vagina.  Burning or pain with urination. How is this diagnosed? This condition is diagnosed based on:  Your medical history.  A physical exam of the vagina.  Checking a sample of vaginal fluid for harmful bacteria or abnormal cells. How is this treated? This condition is treated with antibiotic medicines. These may be given as a pill, a vaginal cream, or a medicine that is put into the  vagina (suppository). If the condition comes back after treatment, a second round of antibiotics may be needed. Follow these instructions at home: Medicines  Take or apply over-the-counter and prescription medicines only as told by your health care provider.  Take or apply your antibiotic medicine as told by your health care provider. Do not stop using the antibiotic even if you start to feel better. General instructions  If you have a female sexual partner, tell her that you have a vaginal infection. She should follow up with her health care provider. If you have a female sexual partner, he does not need treatment.  Avoid sexual activity until you finish treatment.  Drink enough fluid to keep your urine pale yellow.  Keep the area around your vagina and rectum clean. ? Wash the area daily with warm water. ? Wipe yourself from front to back after using the toilet.  If you are breastfeeding, talk to your health care provider about continuing breastfeeding during treatment.  Keep all follow-up visits. This is important. How is this prevented? Self-care  Do not douche.  Wash the outside of your vagina with warm water only.  Wear cotton or cotton-lined underwear.  Avoid wearing tight pants and pantyhose, especially during the summer. Safe sex  Use protection when having sex. This includes: ? Using condoms. ? Using dental dams. This is a thin layer of a material made of latex or polyurethane that protects the mouth during oral sex.  Limit the number of sexual partners. To help prevent bacterial vaginosis, it is best to have sex with just one partner (monogamous relationship).  Make sure you and your sexual partner   are tested for STIs. Drugs and alcohol  Do not use any products that contain nicotine or tobacco. These products include cigarettes, chewing tobacco, and vaping devices, such as e-cigarettes. If you need help quitting, ask your health care provider.  Do not use  drugs.  Do not drink alcohol if: ? Your health care provider tells you not to do this. ? You are pregnant, may be pregnant, or are planning to become pregnant.  If you drink alcohol: ? Limit how much you have to 0-1 drink a day. ? Be aware of how much alcohol is in your drink. In the [place] one drink equals one 12 oz bottle of beer (355 mL), one 5 oz glass of wine (148 mL), or one 1 oz glass of hard liquor (44 mL). Where to find more information  Centers for Disease Control and Prevention: www.cdc.gov  American Sexual Health Association (ASHA): www.ashastd.org  [place].S. Department of Health and Human Services, Office on Women's Health: www.womenshealth.gov Contact a health care provider if:  Your symptoms do not improve, even after treatment.  You have more discharge or pain when urinating.  You have a fever or chills.  You have pain in your abdomen or pelvis.  You have pain during sex.  You have vaginal bleeding between menstrual periods. Summary  Bacterial vaginosis is a vaginal infection that occurs when the normal balance of bacteria in the vagina changes. It results from an overgrowth of certain bacteria.  This condition increases the risk of sexually transmitted infections (STIs). Getting treated can help reduce this risk.  Treatment is very important for pregnant women because this condition can cause babies to be born early (prematurely) or at low birth weight.  This condition is treated with antibiotic medicines. These may be given as a pill, a vaginal cream, or a medicine that is put into the vagina (suppository). This information is not intended to replace advice given to you by your health care provider. Make sure you discuss any questions you have with your health care provider. Document Revised: 11/04/2019 Document Reviewed: 11/04/2019 Elsevier Patient Education  2021 Elsevier Inc.  Health Maintenance, Female Adopting a healthy lifestyle and getting preventive  care are important in promoting health and wellness. Ask your health care provider about:  The right schedule for you to have regular tests and exams.  Things you can do on your own to prevent diseases and keep yourself healthy. What should I know about diet, weight, and exercise? Eat a healthy diet  Eat a diet that includes plenty of vegetables, fruits, low-fat dairy products, and lean protein.  Do not eat a lot of foods that are high in solid fats, added sugars, or sodium.   Maintain a healthy weight Body mass index (BMI) is used to identify weight problems. It estimates body fat based on height and weight. Your health care provider can help determine your BMI and help you achieve or maintain a healthy weight. Get regular exercise Get regular exercise. This is one of the most important things you can do for your health. Most adults should:  Exercise for at least 150 minutes each week. The exercise should increase your heart rate and make you sweat (moderate-intensity exercise).  Do strengthening exercises at least twice a week. This is in addition to the moderate-intensity exercise.  Spend less time sitting. Even light physical activity can be beneficial. Watch cholesterol and blood lipids Have your blood tested for lipids and cholesterol at 41 years of age, then have this   test every 5 years. Have your cholesterol levels checked more often if:  Your lipid or cholesterol levels are high.  You are older than 40 years of age.  You are at high risk for heart disease. What should I know about cancer screening? Depending on your health history and family history, you may need to have cancer screening at various ages. This may include screening for:  Breast cancer.  Cervical cancer.  Colorectal cancer.  Skin cancer.  Lung cancer. What should I know about heart disease, diabetes, and high blood pressure? Blood pressure and heart disease  High blood pressure causes heart disease  and increases the risk of stroke. This is more likely to develop in people who have high blood pressure readings, are of African descent, or are overweight.  Have your blood pressure checked: ? Every 3-5 years if you are 18-39 years of age. ? Every year if you are 40 years old or older. Diabetes Have regular diabetes screenings. This checks your fasting blood sugar level. Have the screening done:  Once every three years after age 40 if you are at a normal weight and have a low risk for diabetes.  More often and at a younger age if you are overweight or have a high risk for diabetes. What should I know about preventing infection? Hepatitis B If you have a higher risk for hepatitis B, you should be screened for this virus. Talk with your health care provider to find out if you are at risk for hepatitis B infection. Hepatitis C Testing is recommended for:  Everyone born from 1945 through 1965.  Anyone with known risk factors for hepatitis C. Sexually transmitted infections (STIs)  Get screened for STIs, including gonorrhea and chlamydia, if: ? You are sexually active and are younger than 41 years of age. ? You are older than 41 years of age and your health care provider tells you that you are at risk for this type of infection. ? Your sexual activity has changed since you were last screened, and you are at increased risk for chlamydia or gonorrhea. Ask your health care provider if you are at risk.  Ask your health care provider about whether you are at high risk for HIV. Your health care provider may recommend a prescription medicine to help prevent HIV infection. If you choose to take medicine to prevent HIV, you should first get tested for HIV. You should then be tested every 3 months for as long as you are taking the medicine. Pregnancy  If you are about to stop having your period (premenopausal) and you may become pregnant, seek counseling before you get pregnant.  Take 400 to 800  micrograms (mcg) of folic acid every day if you become pregnant.  Ask for birth control (contraception) if you want to prevent pregnancy. Osteoporosis and menopause Osteoporosis is a disease in which the bones lose minerals and strength with aging. This can result in bone fractures. If you are 65 years old or older, or if you are at risk for osteoporosis and fractures, ask your health care provider if you should:  Be screened for bone loss.  Take a calcium or vitamin D supplement to lower your risk of fractures.  Be given hormone replacement therapy (HRT) to treat symptoms of menopause. Follow these instructions at home: Lifestyle  Do not use any products that contain nicotine or tobacco, such as cigarettes, e-cigarettes, and chewing tobacco. If you need help quitting, ask your health care provider.  Do   not use street drugs.  Do not share needles.  Ask your health care provider for help if you need support or information about quitting drugs. Alcohol use  Do not drink alcohol if: ? Your health care provider tells you not to drink. ? You are pregnant, may be pregnant, or are planning to become pregnant.  If you drink alcohol: ? Limit how much you use to 0-1 drink a day. ? Limit intake if you are breastfeeding.  Be aware of how much alcohol is in your drink. In the [place] one drink equals one 12 oz bottle of beer (355 mL), one 5 oz glass of wine (148 mL), or one 1 oz glass of hard liquor (44 mL). General instructions  Schedule regular health, dental, and eye exams.  Stay current with your vaccines.  Tell your health care provider if: ? You often feel depressed. ? You have ever been abused or do not feel safe at home. Summary  Adopting a healthy lifestyle and getting preventive care are important in promoting health and wellness.  Follow your health care provider's instructions about healthy diet, exercising, and getting tested or screened for diseases.  Follow your health  care provider's instructions on monitoring your cholesterol and blood pressure. This information is not intended to replace advice given to you by your health care provider. Make sure you discuss any questions you have with your health care provider. Document Revised: 04/29/2018 Document Reviewed: 04/29/2018 Elsevier Patient Education  2021 Elsevier Inc.  

## 2020-08-30 LAB — CBC
HCT: 38.9 % (ref 35.0–45.0)
Hemoglobin: 13.1 g/dL (ref 11.7–15.5)
MCH: 32.2 pg (ref 27.0–33.0)
MCHC: 33.7 g/dL (ref 32.0–36.0)
MCV: 95.6 fL (ref 80.0–100.0)
MPV: 9.4 fL (ref 7.5–12.5)
Platelets: 312 10*3/uL (ref 140–400)
RBC: 4.07 10*6/uL (ref 3.80–5.10)
RDW: 11.3 % (ref 11.0–15.0)
WBC: 6.8 10*3/uL (ref 3.8–10.8)

## 2020-08-30 LAB — LIPID PANEL
Cholesterol: 210 mg/dL — ABNORMAL HIGH (ref <200)
HDL: 71 mg/dL (ref >=50)
LDL Cholesterol (Calc): 121 mg/dL (calc) — ABNORMAL HIGH
Non-HDL Cholesterol (Calc): 139 mg/dL (calc) — ABNORMAL HIGH (ref <130)
Total CHOL/HDL Ratio: 3 (calc) (ref <5.0)
Triglycerides: 82 mg/dL (ref <150)

## 2020-08-30 LAB — COMPREHENSIVE METABOLIC PANEL
AG Ratio: 2 (calc) (ref 1.0–2.5)
ALT: 18 U/L (ref 6–29)
AST: 20 U/L (ref 10–30)
Albumin: 4.4 g/dL (ref 3.6–5.1)
Alkaline phosphatase (APISO): 67 U/L (ref 31–125)
BUN: 9 mg/dL (ref 7–25)
CO2: 26 mmol/L (ref 20–32)
Calcium: 9.9 mg/dL (ref 8.6–10.2)
Chloride: 104 mmol/L (ref 98–110)
Creat: 0.63 mg/dL (ref 0.50–1.10)
Globulin: 2.2 g/dL (calc) (ref 1.9–3.7)
Glucose, Bld: 82 mg/dL (ref 65–99)
Potassium: 4.3 mmol/L (ref 3.5–5.3)
Sodium: 140 mmol/L (ref 135–146)
Total Bilirubin: 0.4 mg/dL (ref 0.2–1.2)
Total Protein: 6.6 g/dL (ref 6.1–8.1)

## 2020-08-30 LAB — VITAMIN D 25 HYDROXY (VIT D DEFICIENCY, FRACTURES): Vit D, 25-Hydroxy: 19 ng/mL — ABNORMAL LOW (ref 30–100)

## 2020-08-31 ENCOUNTER — Encounter: Payer: Self-pay | Admitting: Family Medicine

## 2020-08-31 DIAGNOSIS — T753XXA Motion sickness, initial encounter: Secondary | ICD-10-CM

## 2020-08-31 LAB — CYTOLOGY - PAP
Comment: NEGATIVE
Diagnosis: NEGATIVE
High risk HPV: NEGATIVE

## 2020-08-31 MED ORDER — SCOPOLAMINE 1 MG/3DAYS TD PT72: 1.0000 | MEDICATED_PATCH | TRANSDERMAL | 1 refills | Status: DC

## 2020-09-14 ENCOUNTER — Encounter: Payer: Self-pay | Admitting: Family Medicine

## 2020-09-14 DIAGNOSIS — F410 Panic disorder [episodic paroxysmal anxiety] without agoraphobia: Secondary | ICD-10-CM

## 2020-09-14 DIAGNOSIS — F419 Anxiety disorder, unspecified: Secondary | ICD-10-CM

## 2020-09-15 MED ORDER — SERTRALINE HCL 100 MG PO TABS: 150.0000 mg | ORAL_TABLET | Freq: Every day | ORAL | 4 refills | Status: DC

## 2020-10-15 ENCOUNTER — Other Ambulatory Visit: Payer: Self-pay | Admitting: Family Medicine

## 2020-10-25 ENCOUNTER — Ambulatory Visit: Payer: BC Managed Care – PPO | Admitting: Podiatry

## 2020-10-25 ENCOUNTER — Other Ambulatory Visit: Payer: Self-pay

## 2020-10-25 ENCOUNTER — Encounter: Payer: Self-pay | Admitting: Podiatry

## 2020-10-25 DIAGNOSIS — B351 Tinea unguium: Secondary | ICD-10-CM | POA: Diagnosis not present

## 2020-10-25 NOTE — Progress Notes (Signed)
  Subjective:  Patient ID: Kristen [name], female    DOB: [birth year],  MRN: [id number]  Chief Complaint  Patient presents with  . Nail Problem    "they are a lot better.  I would say they are about seventy percent better"    40 y.o. female returns for follow-up with the above complaint. History confirmed with patient.  Says that about 70% better  Objective:  Physical Exam: warm, good capillary refill, no trophic changes or ulcerative lesions, normal DP and PT pulses and normal sensory exam.  Superficial white onychomycosis 1 through 4 bilaterally with hallux being the worst       Assessment:   1. Onychomycosis      Plan:  Patient was evaluated and treated and all questions answered.   Doing well with Penlac and status post Lamisil treatment.  Recommend she continue Penlac for now that should limit the rest of it.  If it does not improve then will repeat course of terbinafine she will let me know if this is necessary.  Would like to see her back 1 more time in about 6 months to ensure is cleared    No follow-ups on file.

## 2020-12-12 ENCOUNTER — Encounter: Payer: Self-pay | Admitting: Urology

## 2020-12-12 ENCOUNTER — Ambulatory Visit: Payer: BC Managed Care – PPO | Admitting: Urology

## 2020-12-12 ENCOUNTER — Ambulatory Visit
Admission: RE | Admit: 2020-12-12 | Discharge: 2020-12-12 | Disposition: A | Discharge status: Home / Self Care | Payer: BC Managed Care – PPO | Source: Ambulatory Visit | Attending: Urology | Admitting: Urology

## 2020-12-12 ENCOUNTER — Other Ambulatory Visit: Payer: Self-pay

## 2020-12-12 ENCOUNTER — Other Ambulatory Visit: Payer: Self-pay | Admitting: Family Medicine

## 2020-12-12 ENCOUNTER — Ambulatory Visit
Admission: RE | Admit: 2020-12-12 | Discharge: 2020-12-12 | Disposition: A | Discharge status: Home / Self Care | Payer: BC Managed Care – PPO | Attending: Urology | Admitting: Urology

## 2020-12-12 VITALS — BP 116/80 | HR 106 | Ht 65.5 in | Wt 183.0 lb

## 2020-12-12 DIAGNOSIS — N200 Calculus of kidney: Secondary | ICD-10-CM

## 2020-12-12 DIAGNOSIS — R110 Nausea: Secondary | ICD-10-CM | POA: Diagnosis not present

## 2020-12-12 DIAGNOSIS — R109 Unspecified abdominal pain: Secondary | ICD-10-CM

## 2020-12-12 DIAGNOSIS — N201 Calculus of ureter: Secondary | ICD-10-CM | POA: Diagnosis not present

## 2020-12-12 MED ORDER — OXYCODONE HCL 5 MG/5ML PO SOLN: 5.0000 mg | ORAL | 0 refills | Status: DC | PRN

## 2020-12-12 MED ORDER — ONDANSETRON 4 MG PO TBDP: 4.0000 mg | ORAL_TABLET | Freq: Three times a day (TID) | ORAL | 0 refills | Status: DC | PRN

## 2020-12-12 NOTE — Progress Notes (Signed)
12/12/2020 10:53 AM   Kristen [name] 1980/12/07 [id number]  Referring provider: [name], MD [address] [place],  [place] [address]  Chief Complaint  Patient presents with   Nephrolithiasis    HPI: 40 year old female who presents today as an urgent add-on new patient for right flank pain.  She has a personal history of nephrolithiasis, reports that she has passed about 13 stones in her lifetime none of which have required surgical intervention.  She was previously followed by Dr. Achilles Dunk but has not seen urologist in many years.  She reports that over the last month or so, she been having some intermittent right flank pain.  Over the past 2 days, this is significantly worsened with associated nausea.  She denies any fevers or chills.  No dysuria.  Stat KUB performed today does indicate appears to be a fairly large stone overlying the right renal shadow.  She was sent for emergent noncontrast CT scan to help further define this anatomy which in fact indicates a 13 mm right UPJ stone with hydronephrosis, likely secondary to intermittent obstruction along with a few smaller nonobstructing stones on the same side.  She does have some very small nonobstructing stones on the left as well.  Urinalysis today is fairly bland, a few RBCs and WBCs but otherwise unremarkable.     PMH: Past Medical History:  Diagnosis Date   Agoraphobia with panic attacks    Anxiety    Depression    Dyslipidemia    not requiring meds   Hernia    Kidney stone    Migraine headache     Surgical History: Past Surgical History:  Procedure Laterality Date   HERNIA REPAIR  1985   INTRAUTERINE DEVICE INSERTION  01/27/2015   Mirena   LAPAROSCOPIC GASTRIC BANDING N/A 12/26/2012   Procedure: LAPAROSCOPIC GASTRIC BANDING REVISION ;  Surgeon: Atilano Ina, MD;  Location: [facility];  Service: General;  Laterality: N/A;   TONSILLECTOMY AND ADENOIDECTOMY  2007    Home Medications:  Allergies as  of 12/12/2020       Reactions   Imitrex [sumatriptan] Nausea And Vomiting        Medication List        Accurate as of December 12, 2020 10:53 AM. If you have any questions, ask your nurse or doctor.          STOP taking these medications    metroNIDAZOLE 500 MG tablet Commonly known as: FLAGYL Stopped by: Vanna Scotland, MD   scopolamine 1 MG/3DAYS Commonly known as: Transderm-Scop (1.5 MG) Stopped by: Vanna Scotland, MD   terbinafine 250 MG tablet Commonly known as: LamISIL Stopped by: Vanna Scotland, MD       TAKE these medications    ALPRAZolam 1 MG tablet Commonly known as: XANAX TAKE 1/2 TO 1 TABLET EVERY 4 HOURS AS NEEDED FOR      ANXIETY   amitriptyline 100 MG tablet Commonly known as: ELAVIL TAKE 1 TABLET DAILY   Calcium Carb-Cholecalciferol 500-400 MG-UNIT Tabs Take 1 tablet by mouth daily.   esomeprazole 40 MG capsule Commonly known as: NexIUM Take 1 capsule (40 mg total) by mouth daily.   levonorgestrel 20 MCG/24HR IUD Commonly known as: MIRENA 1 each by Intrauterine route once. Inserted 01/21/2020   multivitamin with minerals tablet Take 1 tablet by mouth daily.   pantoprazole 40 MG tablet Commonly known as: PROTONIX Take 40 mg by mouth daily.   sertraline 100 MG tablet Commonly known  as: ZOLOFT Take 1.5 tablets (150 mg total) by mouth daily.        Allergies:  Allergies  Allergen Reactions   Imitrex [Sumatriptan] Nausea And Vomiting    Family History: Family History  Problem Relation Age of Onset   Hypertension Father    Stroke Father    Hyperlipidemia Father    Migraines Mother    Cancer Mother        cervical   Hyperlipidemia Mother    Bipolar disorder Sister    Migraines Sister    Breast cancer Paternal Grandmother 73    Social History:  reports that she has never smoked. She has never used smokeless tobacco. She reports previous alcohol use. She reports that she does not use drugs.   Physical Exam: BP 116/80    Pulse (!) 106   Ht 5' 5.5" (1.664 m)   Wt 183 lb (83 kg)   BMI 29.99 kg/m   Constitutional:  Alert and oriented, No acute distress.  Uncomfortable appearing, lying on exam table. HEENT: [place] AT, moist mucus membranes.  Trachea midline, no masses. Cardiovascular: No clubbing, cyanosis, or edema. Respiratory: Normal respiratory effort, no increased work of breathing. Skin: No rashes, bruises or suspicious lesions. Neurologic: Grossly intact, no focal deficits, moving all 4 extremities. Psychiatric: Normal mood and affect.  Laboratory Data: Lab Results  Component Value Date   WBC 6.8 08/29/2020   HGB 13.1 08/29/2020   HCT 38.9 08/29/2020   MCV 95.6 08/29/2020   PLT 312 08/29/2020    Lab Results  Component Value Date   CREATININE 0.63 08/29/2020    Urinalysis See ePIC  Pertinent Imaging: CLINICAL DATA:  Flank pain   EXAM: CT ABDOMEN AND PELVIS WITHOUT CONTRAST   TECHNIQUE: Multidetector CT imaging of the abdomen and pelvis was performed following the standard protocol without IV contrast.   COMPARISON:  10/22/2011   FINDINGS: Lower chest: No acute abnormality.   Hepatobiliary: No focal liver abnormality is seen. No gallstones, gallbladder wall thickening, or biliary dilatation.   Pancreas: Unremarkable. No pancreatic ductal dilatation or surrounding inflammatory changes.   Spleen: Normal in size without focal abnormality.   Adrenals/Urinary Tract: Normal adrenal glands. Several tiny punctate stones noted within the interpolar collecting system of the left kidney, image 92/5. Similarly, there are several punctate stones within the mid and inferior pole collecting system of the right kidney.   Right-sided hydronephrosis is identified secondary to an L-shaped calcification at the right UPJ which measures 1.2 x 1.2 cm, image 87/5.   No left hydronephrosis, hydroureter or ureteral lithiasis.   Bladder unremarkable.   Stomach/Bowel: Status post gastric banding.  The appendix is visualized and appears normal. No bowel wall thickening, inflammation, or distension.   Vascular/Lymphatic: No significant vascular findings are present. No enlarged abdominal or pelvic lymph nodes.   Reproductive: IUD identified within the uterus. No adnexal mass identified.   Other: No free fluid or fluid collections. No abdominal wall hernia.   Musculoskeletal: No acute or significant osseous findings.   IMPRESSION: 1. Right-sided hydronephrosis secondary to 1.2 x 1.2 cm right UPJ calculus. 2. Bilateral punctate renal calculi as described above.     Electronically Signed   By: Signa Kell M.D.   On: 12/12/2020 12:13  I have personally reviewed the above images and also compared it to her KUB.  Dense, 1300 Hounsfield units stone.   Assessment & Plan:    1. Obstruction of right ureteropelvic junction (UPJ) due to stone Large approximately 13  mm right UPJ stone likely causing ball-valve intermittent obstruction of the right kidney  We discussed various treatment options for urolithiasis including observation with or without medical expulsive therapy, shockwave lithotripsy (SWL), ureteroscopy and laser lithotripsy with stent placement, and percutaneous nephrolithotomy.   We discussed that management is based on stone size, location, density, patient co-morbidities, and patient preference.   Overall size of the stone is fairly large and the stone is relatively dense.  Based on these factors, most wrongly recommended ureteroscopy count.  I am concerned that the stone will not fragment and if it does, we will create Steinstrasse or other postoperative issues.  She was offered this procedure but after discussing these particular risk and concerns, she declined.   Ureteroscopy with laser lithotripsy and stent placement has a higher stone free rate than SWL in a single procedure, however increased complication rate including possible infection, ureteral injury,  bleeding, and stent related morbidity. Common stent related symptoms include dysuria, urgency/frequency, and flank pain.   After an extensive discussion of the risks and benefits of the above treatment options, the patient would like to proceed with right ureteroscopy  - Urinalysis, Complete - CULTURE, URINE COMPREHENSIVE - CT RENAL STONE STUDY; Future - oxyCODONE (ROXICODONE) 5 MG/5ML solution; Take 5 mLs (5 mg total) by mouth every 4 (four) hours as needed for severe pain.  Dispense: 75 mL; Refill: 0 - ondansetron (ZOFRAN-ODT) 4 MG disintegrating tablet; Take 1 tablet (4 mg total) by mouth every 8 (eight) hours as needed for nausea or vomiting.  Dispense: 20 tablet; Refill: 0  2. Nausea Disintegrating Zofran tabs prescribed  3. Flank pain Significant secondary to #1  We offered her a Toradol injection which she declined today.  She requested liquid oxycodone given her Lap-Band and difficulty swallowing tablets.  This was prescribed today.  Lastly, we discussed warning symptoms and indications for more urgent/emergent evaluation if her pain is unable to be controlled with the above or she develops any signs or symptoms of infection.  She understands all this.    Vanna Scotland, MD  [facility] [address] [place], [place] [address] [phone]  I spent 60 total minutes on the day of the encounter including pre-visit review of the medical record, face-to-face time with the patient, and post visit ordering of labs/imaging/tests.  2 stat studies were ordered and interpreted today.  She was booked for the above surgery.

## 2020-12-12 NOTE — H&P (View-Only) (Signed)
12/12/2020 10:53 AM   Kristen [name] 1980/12/07 [id number]  Referring provider: [name], MD [address] [place],  [place] [address]  Chief Complaint  Patient presents with   Nephrolithiasis    HPI: 40 year old female who presents today as an urgent add-on new patient for right flank pain.  She has a personal history of nephrolithiasis, reports that she has passed about 13 stones in her lifetime none of which have required surgical intervention.  She was previously followed by Dr. Achilles Dunk but has not seen urologist in many years.  She reports that over the last month or so, she been having some intermittent right flank pain.  Over the past 2 days, this is significantly worsened with associated nausea.  She denies any fevers or chills.  No dysuria.  Stat KUB performed today does indicate appears to be a fairly large stone overlying the right renal shadow.  She was sent for emergent noncontrast CT scan to help further define this anatomy which in fact indicates a 13 mm right UPJ stone with hydronephrosis, likely secondary to intermittent obstruction along with a few smaller nonobstructing stones on the same side.  She does have some very small nonobstructing stones on the left as well.  Urinalysis today is fairly bland, a few RBCs and WBCs but otherwise unremarkable.     PMH: Past Medical History:  Diagnosis Date   Agoraphobia with panic attacks    Anxiety    Depression    Dyslipidemia    not requiring meds   Hernia    Kidney stone    Migraine headache     Surgical History: Past Surgical History:  Procedure Laterality Date   HERNIA REPAIR  1985   INTRAUTERINE DEVICE INSERTION  01/27/2015   Mirena   LAPAROSCOPIC GASTRIC BANDING N/A 12/26/2012   Procedure: LAPAROSCOPIC GASTRIC BANDING REVISION ;  Surgeon: Atilano Ina, MD;  Location: [facility];  Service: General;  Laterality: N/A;   TONSILLECTOMY AND ADENOIDECTOMY  2007    Home Medications:  Allergies as  of 12/12/2020       Reactions   Imitrex [sumatriptan] Nausea And Vomiting        Medication List        Accurate as of December 12, 2020 10:53 AM. If you have any questions, ask your nurse or doctor.          STOP taking these medications    metroNIDAZOLE 500 MG tablet Commonly known as: FLAGYL Stopped by: Vanna Scotland, MD   scopolamine 1 MG/3DAYS Commonly known as: Transderm-Scop (1.5 MG) Stopped by: Vanna Scotland, MD   terbinafine 250 MG tablet Commonly known as: LamISIL Stopped by: Vanna Scotland, MD       TAKE these medications    ALPRAZolam 1 MG tablet Commonly known as: XANAX TAKE 1/2 TO 1 TABLET EVERY 4 HOURS AS NEEDED FOR      ANXIETY   amitriptyline 100 MG tablet Commonly known as: ELAVIL TAKE 1 TABLET DAILY   Calcium Carb-Cholecalciferol 500-400 MG-UNIT Tabs Take 1 tablet by mouth daily.   esomeprazole 40 MG capsule Commonly known as: NexIUM Take 1 capsule (40 mg total) by mouth daily.   levonorgestrel 20 MCG/24HR IUD Commonly known as: MIRENA 1 each by Intrauterine route once. Inserted 01/21/2020   multivitamin with minerals tablet Take 1 tablet by mouth daily.   pantoprazole 40 MG tablet Commonly known as: PROTONIX Take 40 mg by mouth daily.   sertraline 100 MG tablet Commonly known  as: ZOLOFT Take 1.5 tablets (150 mg total) by mouth daily.        Allergies:  Allergies  Allergen Reactions   Imitrex [Sumatriptan] Nausea And Vomiting    Family History: Family History  Problem Relation Age of Onset   Hypertension Father    Stroke Father    Hyperlipidemia Father    Migraines Mother    Cancer Mother        cervical   Hyperlipidemia Mother    Bipolar disorder Sister    Migraines Sister    Breast cancer Paternal Grandmother 73    Social History:  reports that she has never smoked. She has never used smokeless tobacco. She reports previous alcohol use. She reports that she does not use drugs.   Physical Exam: BP 116/80    Pulse (!) 106   Ht 5' 5.5" (1.664 m)   Wt 183 lb (83 kg)   BMI 29.99 kg/m   Constitutional:  Alert and oriented, No acute distress.  Uncomfortable appearing, lying on exam table. HEENT: [place] AT, moist mucus membranes.  Trachea midline, no masses. Cardiovascular: No clubbing, cyanosis, or edema. Respiratory: Normal respiratory effort, no increased work of breathing. Skin: No rashes, bruises or suspicious lesions. Neurologic: Grossly intact, no focal deficits, moving all 4 extremities. Psychiatric: Normal mood and affect.  Laboratory Data: Lab Results  Component Value Date   WBC 6.8 08/29/2020   HGB 13.1 08/29/2020   HCT 38.9 08/29/2020   MCV 95.6 08/29/2020   PLT 312 08/29/2020    Lab Results  Component Value Date   CREATININE 0.63 08/29/2020    Urinalysis See ePIC  Pertinent Imaging: CLINICAL DATA:  Flank pain   EXAM: CT ABDOMEN AND PELVIS WITHOUT CONTRAST   TECHNIQUE: Multidetector CT imaging of the abdomen and pelvis was performed following the standard protocol without IV contrast.   COMPARISON:  10/22/2011   FINDINGS: Lower chest: No acute abnormality.   Hepatobiliary: No focal liver abnormality is seen. No gallstones, gallbladder wall thickening, or biliary dilatation.   Pancreas: Unremarkable. No pancreatic ductal dilatation or surrounding inflammatory changes.   Spleen: Normal in size without focal abnormality.   Adrenals/Urinary Tract: Normal adrenal glands. Several tiny punctate stones noted within the interpolar collecting system of the left kidney, image 92/5. Similarly, there are several punctate stones within the mid and inferior pole collecting system of the right kidney.   Right-sided hydronephrosis is identified secondary to an L-shaped calcification at the right UPJ which measures 1.2 x 1.2 cm, image 87/5.   No left hydronephrosis, hydroureter or ureteral lithiasis.   Bladder unremarkable.   Stomach/Bowel: Status post gastric banding.  The appendix is visualized and appears normal. No bowel wall thickening, inflammation, or distension.   Vascular/Lymphatic: No significant vascular findings are present. No enlarged abdominal or pelvic lymph nodes.   Reproductive: IUD identified within the uterus. No adnexal mass identified.   Other: No free fluid or fluid collections. No abdominal wall hernia.   Musculoskeletal: No acute or significant osseous findings.   IMPRESSION: 1. Right-sided hydronephrosis secondary to 1.2 x 1.2 cm right UPJ calculus. 2. Bilateral punctate renal calculi as described above.     Electronically Signed   By: Signa Kell M.D.   On: 12/12/2020 12:13  I have personally reviewed the above images and also compared it to her KUB.  Dense, 1300 Hounsfield units stone.   Assessment & Plan:    1. Obstruction of right ureteropelvic junction (UPJ) due to stone Large approximately 13  mm right UPJ stone likely causing ball-valve intermittent obstruction of the right kidney  We discussed various treatment options for urolithiasis including observation with or without medical expulsive therapy, shockwave lithotripsy (SWL), ureteroscopy and laser lithotripsy with stent placement, and percutaneous nephrolithotomy.   We discussed that management is based on stone size, location, density, patient co-morbidities, and patient preference.   Overall size of the stone is fairly large and the stone is relatively dense.  Based on these factors, most wrongly recommended ureteroscopy count.  I am concerned that the stone will not fragment and if it does, we will create Steinstrasse or other postoperative issues.  She was offered this procedure but after discussing these particular risk and concerns, she declined.   Ureteroscopy with laser lithotripsy and stent placement has a higher stone free rate than SWL in a single procedure, however increased complication rate including possible infection, ureteral injury,  bleeding, and stent related morbidity. Common stent related symptoms include dysuria, urgency/frequency, and flank pain.   After an extensive discussion of the risks and benefits of the above treatment options, the patient would like to proceed with right ureteroscopy  - Urinalysis, Complete - CULTURE, URINE COMPREHENSIVE - CT RENAL STONE STUDY; Future - oxyCODONE (ROXICODONE) 5 MG/5ML solution; Take 5 mLs (5 mg total) by mouth every 4 (four) hours as needed for severe pain.  Dispense: 75 mL; Refill: 0 - ondansetron (ZOFRAN-ODT) 4 MG disintegrating tablet; Take 1 tablet (4 mg total) by mouth every 8 (eight) hours as needed for nausea or vomiting.  Dispense: 20 tablet; Refill: 0  2. Nausea Disintegrating Zofran tabs prescribed  3. Flank pain Significant secondary to #1  We offered her a Toradol injection which she declined today.  She requested liquid oxycodone given her Lap-Band and difficulty swallowing tablets.  This was prescribed today.  Lastly, we discussed warning symptoms and indications for more urgent/emergent evaluation if her pain is unable to be controlled with the above or she develops any signs or symptoms of infection.  She understands all this.    Vanna Scotland, MD  [facility] [address] [place], [place] [address] [phone]  I spent 60 total minutes on the day of the encounter including pre-visit review of the medical record, face-to-face time with the patient, and post visit ordering of labs/imaging/tests.  2 stat studies were ordered and interpreted today.  She was booked for the above surgery.

## 2020-12-13 ENCOUNTER — Other Ambulatory Visit: Payer: Self-pay | Admitting: Urology

## 2020-12-13 DIAGNOSIS — N201 Calculus of ureter: Secondary | ICD-10-CM

## 2020-12-14 LAB — URINALYSIS, COMPLETE
Bilirubin, UA: NEGATIVE
Glucose, UA: NEGATIVE
Ketones, UA: NEGATIVE
Leukocytes,UA: NEGATIVE
Nitrite, UA: NEGATIVE
Specific Gravity, UA: 1.025 (ref 1.005–1.030)
Urobilinogen, Ur: 1 mg/dL (ref 0.2–1.0)
pH, UA: 6 (ref 5.0–7.5)

## 2020-12-14 LAB — MICROSCOPIC EXAMINATION

## 2020-12-14 LAB — CULTURE, URINE COMPREHENSIVE

## 2020-12-18 ENCOUNTER — Ambulatory Visit: Payer: BC Managed Care – PPO | Admitting: Anesthesiology

## 2020-12-18 ENCOUNTER — Inpatient Hospital Stay: Admission: RE | Admit: 2020-12-18 | Payer: BC Managed Care – PPO | Source: Ambulatory Visit

## 2020-12-18 ENCOUNTER — Other Ambulatory Visit: Payer: Self-pay

## 2020-12-18 ENCOUNTER — Ambulatory Visit: Payer: BC Managed Care – PPO

## 2020-12-18 ENCOUNTER — Encounter
Admission: RE | Disposition: A | Discharge status: Home / Self Care | Payer: Self-pay | Source: Home / Self Care | Attending: Urology

## 2020-12-18 ENCOUNTER — Ambulatory Visit
Admission: RE | Admit: 2020-12-18 | Discharge: 2020-12-18 | Disposition: A | Discharge status: Home / Self Care | Payer: BC Managed Care – PPO | Attending: Urology | Admitting: Urology

## 2020-12-18 ENCOUNTER — Encounter: Payer: Self-pay | Admitting: Urology

## 2020-12-18 DIAGNOSIS — Z823 Family history of stroke: Secondary | ICD-10-CM | POA: Insufficient documentation

## 2020-12-18 DIAGNOSIS — F419 Anxiety disorder, unspecified: Secondary | ICD-10-CM

## 2020-12-18 DIAGNOSIS — N200 Calculus of kidney: Secondary | ICD-10-CM | POA: Insufficient documentation

## 2020-12-18 DIAGNOSIS — Z803 Family history of malignant neoplasm of breast: Secondary | ICD-10-CM | POA: Diagnosis not present

## 2020-12-18 DIAGNOSIS — Z8049 Family history of malignant neoplasm of other genital organs: Secondary | ICD-10-CM | POA: Diagnosis not present

## 2020-12-18 DIAGNOSIS — N201 Calculus of ureter: Secondary | ICD-10-CM

## 2020-12-18 DIAGNOSIS — Z8249 Family history of ischemic heart disease and other diseases of the circulatory system: Secondary | ICD-10-CM | POA: Insufficient documentation

## 2020-12-18 DIAGNOSIS — Z79899 Other long term (current) drug therapy: Secondary | ICD-10-CM | POA: Insufficient documentation

## 2020-12-18 DIAGNOSIS — Z87442 Personal history of urinary calculi: Secondary | ICD-10-CM | POA: Insufficient documentation

## 2020-12-18 DIAGNOSIS — Z888 Allergy status to other drugs, medicaments and biological substances status: Secondary | ICD-10-CM | POA: Diagnosis not present

## 2020-12-18 DIAGNOSIS — F410 Panic disorder [episodic paroxysmal anxiety] without agoraphobia: Secondary | ICD-10-CM

## 2020-12-18 HISTORY — PX: CYSTOSCOPY/URETEROSCOPY/HOLMIUM LASER/STENT PLACEMENT: SHX6546

## 2020-12-18 SURGERY — CYSTOSCOPY/URETEROSCOPY/HOLMIUM LASER/STENT PLACEMENT
Anesthesia: General | Site: Ureter | Laterality: Right

## 2020-12-18 MED ORDER — GLYCOPYRROLATE 0.2 MG/ML IJ SOLN
INTRAMUSCULAR | Status: AC
  Filled 2020-12-18: qty 1

## 2020-12-18 MED ORDER — FENTANYL CITRATE (PF) 100 MCG/2ML IJ SOLN
INTRAMUSCULAR | Status: DC | PRN
  Administered 2020-12-18 (×2): 100 ug via INTRAVENOUS

## 2020-12-18 MED ORDER — ROCURONIUM BROMIDE 10 MG/ML (PF) SYRINGE
PREFILLED_SYRINGE | INTRAVENOUS | Status: AC
  Filled 2020-12-18: qty 10

## 2020-12-18 MED ORDER — FENTANYL CITRATE (PF) 100 MCG/2ML IJ SOLN
25.0000 ug | INTRAMUSCULAR | Status: DC | PRN
  Administered 2020-12-18 (×3): 25 ug via INTRAVENOUS

## 2020-12-18 MED ORDER — OXYBUTYNIN CHLORIDE 5 MG/5ML PO SYRP: 5.0000 mg | ORAL_SOLUTION | Freq: Three times a day (TID) | ORAL | 0 refills | Status: DC | PRN

## 2020-12-18 MED ORDER — SEVOFLURANE IN SOLN
RESPIRATORY_TRACT | Status: AC
  Filled 2020-12-18: qty 250

## 2020-12-18 MED ORDER — IOHEXOL 180 MG/ML  SOLN
INTRAMUSCULAR | Status: DC | PRN
  Administered 2020-12-18: 5 mL

## 2020-12-18 MED ORDER — PROPOFOL 10 MG/ML IV BOLUS
INTRAVENOUS | Status: DC | PRN
Start: 2020-12-18 — End: 2020-12-18
  Administered 2020-12-18: 150 mg via INTRAVENOUS

## 2020-12-18 MED ORDER — 0.9 % SODIUM CHLORIDE (POUR BTL) OPTIME
TOPICAL | Status: DC | PRN
  Administered 2020-12-18: 1000 mL

## 2020-12-18 MED ORDER — OXYCODONE HCL 5 MG PO TABS
ORAL_TABLET | ORAL | Status: AC
  Administered 2020-12-18: 5 mg via ORAL
  Filled 2020-12-18: qty 1

## 2020-12-18 MED ORDER — KETOROLAC TROMETHAMINE 30 MG/ML IJ SOLN
INTRAMUSCULAR | Status: DC | PRN
  Administered 2020-12-18: 30 mg via INTRAVENOUS

## 2020-12-18 MED ORDER — MIDAZOLAM HCL 2 MG/2ML IJ SOLN
INTRAMUSCULAR | Status: DC | PRN
  Administered 2020-12-18: 2 mg via INTRAVENOUS

## 2020-12-18 MED ORDER — DEXAMETHASONE SODIUM PHOSPHATE 10 MG/ML IJ SOLN
INTRAMUSCULAR | Status: DC | PRN
  Administered 2020-12-18: 10 mg via INTRAVENOUS

## 2020-12-18 MED ORDER — ONDANSETRON HCL 4 MG/2ML IJ SOLN
INTRAMUSCULAR | Status: DC | PRN
  Administered 2020-12-18: 4 mg via INTRAVENOUS

## 2020-12-18 MED ORDER — PROPOFOL 10 MG/ML IV BOLUS
INTRAVENOUS | Status: AC
  Filled 2020-12-18: qty 20

## 2020-12-18 MED ORDER — FENTANYL CITRATE (PF) 100 MCG/2ML IJ SOLN
INTRAMUSCULAR | Status: AC
  Administered 2020-12-18: 25 ug via INTRAVENOUS
  Filled 2020-12-18: qty 2

## 2020-12-18 MED ORDER — LIDOCAINE HCL (PF) 2 % IJ SOLN
INTRAMUSCULAR | Status: AC
  Filled 2020-12-18: qty 5

## 2020-12-18 MED ORDER — ORAL CARE MOUTH RINSE: 15.0000 mL | Freq: Once | OROMUCOSAL | Status: AC

## 2020-12-18 MED ORDER — LACTATED RINGERS IV SOLN: INTRAVENOUS | Status: DC

## 2020-12-18 MED ORDER — ONDANSETRON HCL 4 MG/2ML IJ SOLN
INTRAMUSCULAR | Status: AC
  Filled 2020-12-18: qty 2

## 2020-12-18 MED ORDER — CEFAZOLIN SODIUM-DEXTROSE 2-4 GM/100ML-% IV SOLN
2.0000 g | INTRAVENOUS | Status: AC
  Administered 2020-12-18: 2 g via INTRAVENOUS

## 2020-12-18 MED ORDER — ROCURONIUM BROMIDE 100 MG/10ML IV SOLN
INTRAVENOUS | Status: DC | PRN
  Administered 2020-12-18: 60 mg via INTRAVENOUS

## 2020-12-18 MED ORDER — KETOROLAC TROMETHAMINE 30 MG/ML IJ SOLN
INTRAMUSCULAR | Status: AC
  Filled 2020-12-18: qty 1

## 2020-12-18 MED ORDER — CHLORHEXIDINE GLUCONATE 0.12 % MT SOLN
OROMUCOSAL | Status: AC
  Administered 2020-12-18: 15 mL via OROMUCOSAL
  Filled 2020-12-18: qty 15

## 2020-12-18 MED ORDER — ONDANSETRON HCL 4 MG/2ML IJ SOLN: 4.0000 mg | Freq: Once | INTRAMUSCULAR | Status: DC | PRN

## 2020-12-18 MED ORDER — FENTANYL CITRATE (PF) 100 MCG/2ML IJ SOLN
INTRAMUSCULAR | Status: AC
  Filled 2020-12-18: qty 2

## 2020-12-18 MED ORDER — CHLORHEXIDINE GLUCONATE 0.12 % MT SOLN: 15.0000 mL | Freq: Once | OROMUCOSAL | Status: AC

## 2020-12-18 MED ORDER — CEFAZOLIN SODIUM-DEXTROSE 2-4 GM/100ML-% IV SOLN
INTRAVENOUS | Status: AC
  Filled 2020-12-18: qty 100

## 2020-12-18 MED ORDER — MIDAZOLAM HCL 2 MG/2ML IJ SOLN
INTRAMUSCULAR | Status: AC
  Filled 2020-12-18: qty 2

## 2020-12-18 MED ORDER — LIDOCAINE HCL (CARDIAC) PF 100 MG/5ML IV SOSY
PREFILLED_SYRINGE | INTRAVENOUS | Status: DC | PRN
  Administered 2020-12-18: 80 mg via INTRAVENOUS

## 2020-12-18 MED ORDER — SUGAMMADEX SODIUM 200 MG/2ML IV SOLN
INTRAVENOUS | Status: DC | PRN
  Administered 2020-12-18: 200 mg via INTRAVENOUS

## 2020-12-18 MED ORDER — OXYCODONE HCL 5 MG PO TABS: 5.0000 mg | ORAL_TABLET | Freq: Once | ORAL | Status: AC

## 2020-12-18 MED ORDER — STERILE WATER FOR IRRIGATION IR SOLN
Status: DC | PRN
Start: 2020-12-18 — End: 2020-12-18
  Administered 2020-12-18: 250 mL

## 2020-12-18 MED ORDER — DEXAMETHASONE SODIUM PHOSPHATE 10 MG/ML IJ SOLN
INTRAMUSCULAR | Status: AC
  Filled 2020-12-18: qty 1

## 2020-12-18 SURGICAL SUPPLY — 29 items
BAG DRAIN CYSTO-URO LG1000N (MISCELLANEOUS) ×2 IMPLANT
BASKET ZERO TIP 1.9FR (BASKET) ×2 IMPLANT
BRUSH SCRUB EZ 1% IODOPHOR (MISCELLANEOUS) ×2 IMPLANT
BSKT STON RTRVL ZERO TP 1.9FR (BASKET) ×1
CATH URET FLEX-TIP 2 LUMEN 10F (CATHETERS) ×2 IMPLANT
CATH URETL OPEN 5X70 (CATHETERS) ×2 IMPLANT
CNTNR SPEC 2.5X3XGRAD LEK (MISCELLANEOUS)
CONT SPEC 4OZ STER OR WHT (MISCELLANEOUS)
CONT SPEC 4OZ STRL OR WHT (MISCELLANEOUS)
CONTAINER SPEC 2.5X3XGRAD LEK (MISCELLANEOUS) IMPLANT
DRAPE UTILITY 15X26 TOWEL STRL (DRAPES) ×2 IMPLANT
GAUZE 4X4 16PLY ~~LOC~~+RFID DBL (SPONGE) ×4 IMPLANT
GLOVE SURG ENC MOIS LTX SZ6.5 (GLOVE) ×2 IMPLANT
GOWN STRL REUS W/ TWL LRG LVL3 (GOWN DISPOSABLE) ×2 IMPLANT
GOWN STRL REUS W/TWL LRG LVL3 (GOWN DISPOSABLE) ×4
GUIDEWIRE GREEN .038 145CM (MISCELLANEOUS) ×2 IMPLANT
GUIDEWIRE STR DUAL SENSOR (WIRE) ×2 IMPLANT
INFUSOR MANOMETER BAG 3000ML (MISCELLANEOUS) ×2 IMPLANT
IV NS IRRIG 3000ML ARTHROMATIC (IV SOLUTION) ×2 IMPLANT
KIT TURNOVER CYSTO (KITS) ×2 IMPLANT
MANIFOLD NEPTUNE II (INSTRUMENTS) ×2 IMPLANT
PACK CYSTO AR (MISCELLANEOUS) ×2 IMPLANT
SET CYSTO W/LG BORE CLAMP LF (SET/KITS/TRAYS/PACK) ×2 IMPLANT
SHEATH URETERAL 12FRX35CM (MISCELLANEOUS) ×2 IMPLANT
STENT URET 6FRX24 CONTOUR (STENTS) ×2 IMPLANT
STENT URET 6FRX26 CONTOUR (STENTS) IMPLANT
SURGILUBE 2OZ TUBE FLIPTOP (MISCELLANEOUS) ×2 IMPLANT
TRACTIP FLEXIVA PULSE ID 200 (Laser) ×2 IMPLANT
WATER STERILE IRR 1000ML POUR (IV SOLUTION) ×2 IMPLANT

## 2020-12-18 NOTE — Transfer of Care (Signed)
Immediate Anesthesia Transfer of Care Note  Patient: Kristen [name]  Procedure(s) Performed: CYSTOSCOPY/URETEROSCOPY/HOLMIUM LASER/STENT PLACEMENT (Right: Ureter)  Patient Location: PACU  Anesthesia Type:General  Level of Consciousness: awake, alert  and oriented  Airway & Oxygen Therapy: Patient Spontanous Breathing and Patient connected to face mask oxygen  Post-op Assessment: Report given to RN and Post -op Vital signs reviewed and stable  Post vital signs: Reviewed and stable  Last Vitals:  Vitals Value Taken Time  BP 114/78 12/18/20 1615  Temp 36.1 C 12/18/20 1610  Pulse 86 12/18/20 1616  Resp 26 12/18/20 1616  SpO2 97 % 12/18/20 1616  Vitals shown include unvalidated device data.  Last Pain:  Vitals:   12/18/20 1610  PainSc: 6          Complications: No notable events documented.

## 2020-12-18 NOTE — Op Note (Signed)
Date of procedure: 12/18/20  Preoperative diagnosis:  1.2 cm right renal pelvic stone  Postoperative diagnosis:  Same as above  Procedure: Right ureteroscopy with laser lithotripsy Right retrograde pyelogram Basket extraction of stone fragment Right ureteral stent placement Interpretation of fluoroscopy less than 30 minutes  Surgeon: Vanna Scotland, MD  Anesthesia: General  Complications: None  Intraoperative findings: 1.2 cm right renal pelvic stone obliterated, fragments removed.  Significant amount of dust.  Otherwise uncomplicated.  Stent left without tether  EBL: Minimal  Specimens: Stone fragment  Drains: 6 x 24 French double-J ureteral stent on right  Indication: Kristen [name] is a 40 y.o. patient with 1.2 cm right UPJ stone with probable intermittent obstruction.  After reviewing the management options for treatment, she elected to proceed with the above surgical procedure(s). We have discussed the potential benefits and risks of the procedure, side effects of the proposed treatment, the likelihood of the patient achieving the goals of the procedure, and any potential problems that might occur during the procedure or recuperation. Informed consent has been obtained.  Description of procedure:  The patient was taken to the operating room and general anesthesia was induced.  The patient was placed in the dorsal lithotomy position, prepped and draped in the usual sterile fashion, and preoperative antibiotics were administered. A preoperative time-out was performed.   A 21 French scope was advanced per urethra into the bladder.  Attention was turned to the right ureteral orifice.  On scout imaging, the stone in question could be easily seen within the renal pelvis.  A gentle retrograde pyelogram was then performed by intubating the right UO with a 5 [place] open-ended ureteral catheter.  This showed a large filling defect within the right renal pelvis but otherwise no  hydronephrosis.  A wire was then placed up to the level of the kidney.  A dual-lumen access sheath was used to introduce a second Super Stiff wire up to the level of the kidney.  The second sensor wire was snapped in place as a safety wire.  I then advanced a 12/14 [place] Cook ureteral access sheath which went easily under fluoroscopic and into the proximal ureter.  The inner cannula and Super Stiff were removed.  8 8 [place] digital flexible ureteroscope was then advanced to the renal pelvis where the stone was encountered.  A 200 brachy laser fiber was then brought in using dusting settings of 0.3 J and 60 Hz, the stone was fragmented into innumerable tiny dust like particles.  The larger of the particles were removed using a 1.9 [place] tipless nitinol basket.  Given the size of stone, there is lots of fragments primarily in the upper pole and a few in the midpole but none had had migrated into the lower pole.  Once the entire collecting system had been cleared other than some tiny dust approximately the tip of the size of the laser fiber, I shot 1 final retrograde pyelogram which showed no contrast extravasation.  There is no obvious radiographic stone remaining.  The scope was impacted in length the ureter inspecting along the way.  The ureteral integrity was excellent and there was no fragments identified.  A 6 x 24 French double-J ureteral stent was then advanced over the wire up to the level of the kidney.  The wire was then withdrawn and a full coil was noted both within the renal pelvis as well as within the bladder.  The bladder was then drained.  Plan: She was prescribed liquid  oxycodone liquid Ditropan given her difficulty swallowing tablets.  She was also previously prescribed Zofran.  We will have her return next week for cystoscopy, stent removal in the office to allow time for interval passage of all of the dust.  Vanna Scotland, M.D.

## 2020-12-18 NOTE — Anesthesia Preprocedure Evaluation (Signed)
Anesthesia Evaluation  Patient identified by MRN, date of birth, ID band Patient awake    Reviewed: Allergy & Precautions, H&P , NPO status , Patient's Chart, lab work & pertinent test results, reviewed documented beta blocker date and time   Airway Mallampati: II  TM Distance: >3 FB Neck ROM: full    Dental  (+) Teeth Intact   Pulmonary neg pulmonary ROS,    Pulmonary exam normal        Cardiovascular Exercise Tolerance: Good negative cardio ROS Normal cardiovascular exam Rhythm:regular Rate:Normal     Neuro/Psych  Headaches, PSYCHIATRIC DISORDERS Anxiety Depression    GI/Hepatic negative GI ROS, Neg liver ROS,   Endo/Other  negative endocrine ROS  Renal/GU Renal disease  negative genitourinary   Musculoskeletal   Abdominal   Peds  Hematology negative hematology ROS (+)   Anesthesia Other Findings Past Medical History: No date: Agoraphobia with panic attacks No date: Anxiety No date: Depression No date: Dyslipidemia     Comment:  not requiring meds No date: Hernia No date: Kidney stone No date: Migraine headache Past Surgical History: 1985: HERNIA REPAIR 01/27/2015: INTRAUTERINE DEVICE INSERTION     Comment:  Mirena 12/26/2012: LAPAROSCOPIC GASTRIC BANDING; N/A     Comment:  Procedure: LAPAROSCOPIC GASTRIC BANDING REVISION ;                Surgeon: Atilano Ina, MD;  Location: [facility];  Service:               General;  Laterality: N/A; 2007: TONSILLECTOMY AND ADENOIDECTOMY   Reproductive/Obstetrics negative OB ROS                             Anesthesia Physical Anesthesia Plan  ASA: 2  Anesthesia Plan: General ETT   Post-op Pain Management:    Induction:   PONV Risk Score and Plan:   Airway Management Planned:   Additional Equipment:   Intra-op Plan:   Post-operative Plan:   Informed Consent: I have reviewed the patients History and Physical, chart, labs and  discussed the procedure including the risks, benefits and alternatives for the proposed anesthesia with the patient or authorized representative who has indicated his/her understanding and acceptance.     Dental Advisory Given  Plan Discussed with: CRNA  Anesthesia Plan Comments:         Anesthesia Quick Evaluation

## 2020-12-18 NOTE — Interval H&P Note (Signed)
History and Physical Interval Note:  12/18/2020 1:12 PM  Kristen [name]  has presented today for surgery, with the diagnosis of right UPJ stone.  The various methods of treatment have been discussed with the patient and family. After consideration of risks, benefits and other options for treatment, the patient has consented to  Procedure(s): CYSTOSCOPY/URETEROSCOPY/HOLMIUM LASER/STENT PLACEMENT (Right) as a surgical intervention.  The patient's history has been reviewed, patient examined, no change in status, stable for surgery.  I have reviewed the patient's chart and labs.  Questions were answered to the patient's satisfaction.    RRR CTAB  Vanna Scotland

## 2020-12-18 NOTE — Discharge Instructions (Addendum)
You have a ureteral stent in place.  This is a tube that extends from your kidney to your bladder.  This may cause urinary bleeding, burning with urination, and urinary frequency.  Please call our office or present to the ED if you develop fevers >101 or pain which is not able to be controlled with oral pain medications.  You may be given either Flomax and/ or ditropan to help with bladder spasms and stent pain in addition to pain medications.    [facility] [address], [address] [place], [place] [address] [phone] AMBULATORY SURGERY  DISCHARGE INSTRUCTIONS   The drugs that you were given will stay in your system until tomorrow so for the next 24 hours you should not:  Drive an automobile Make any legal decisions Drink any alcoholic beverage   You may resume regular meals tomorrow.  Today it is better to start with liquids and gradually work up to solid foods.  You may eat anything you prefer, but it is better to start with liquids, then soup and crackers, and gradually work up to solid foods.   Please notify your doctor immediately if you have any unusual bleeding, trouble breathing, redness and pain at the surgery site, drainage, fever, or pain not relieved by medication.     Your post-operative visit with Dr.                                       is: Date:                        Time:    Please call to schedule your post-operative visit.  Additional Instructions:             

## 2020-12-18 NOTE — Anesthesia Procedure Notes (Signed)
Procedure Name: Intubation Date/Time: 12/18/2020 3:02 PM Performed by: Katherine Basset, CRNA Pre-anesthesia Checklist: Patient identified, Emergency Drugs available, Suction available and Patient being monitored Patient Re-evaluated:Patient Re-evaluated prior to induction Oxygen Delivery Method: Circle system utilized Preoxygenation: Pre-oxygenation with 100% oxygen Induction Type: IV induction Ventilation: Mask ventilation without difficulty Laryngoscope Size: Miller and 2 Grade View: Grade I Tube type: Oral Tube size: 7.0 mm Number of attempts: 1 Airway Equipment and Method: Stylet and Oral airway Placement Confirmation: ETT inserted through vocal cords under direct vision, positive ETCO2 and breath sounds checked- equal and bilateral Secured at: 21 cm Tube secured with: Tape Dental Injury: Teeth and Oropharynx as per pre-operative assessment

## 2020-12-19 ENCOUNTER — Telehealth: Payer: Self-pay | Admitting: Urology

## 2020-12-19 ENCOUNTER — Encounter: Payer: Self-pay | Admitting: Urology

## 2020-12-19 ENCOUNTER — Telehealth: Payer: BC Managed Care – PPO | Admitting: *Deleted

## 2020-12-19 DIAGNOSIS — N201 Calculus of ureter: Secondary | ICD-10-CM

## 2020-12-19 MED ORDER — OXYCODONE HCL 5 MG/5ML PO SOLN: 5.0000 mg | ORAL | 0 refills | Status: DC | PRN

## 2020-12-19 NOTE — Telephone Encounter (Signed)
Patient called back and stated that she has doubled up on the meds and that it seems to be helping. She is asking for a refill because she said since she is doubling up that she will run out faster. I have scheduled her stent removal for the 9th.   Marcelino Duster

## 2020-12-19 NOTE — Telephone Encounter (Signed)
Patient states she is not having no fever or chili's, she has been taking the pain medication .  She states she is having the worst back pain ever. The pain medication is dont touching how much pain she is in.   Dr. Apolinar Junes Stated . I think I prescribed 5 mg, she can take up to 10 at at time every 4-6 hours.  also offer her Toradol injection here in the office. Patient is going to try her pain medication as Dr. Apolinar Junes said and will call us back if needed.

## 2020-12-19 NOTE — Telephone Encounter (Signed)
Refilled

## 2020-12-20 NOTE — Telephone Encounter (Signed)
Patient informed, voiced understanding.  °

## 2020-12-23 LAB — CALCULI, WITH PHOTOGRAPH (CLINICAL LAB)
Calcium Oxalate Dihydrate: 50 %
Calcium Oxalate Monohydrate: 40 %
Hydroxyapatite: 10 %
Weight Calculi: 12 mg

## 2020-12-25 ENCOUNTER — Encounter: Payer: Self-pay | Admitting: Family Medicine

## 2020-12-25 DIAGNOSIS — N200 Calculus of kidney: Secondary | ICD-10-CM | POA: Insufficient documentation

## 2020-12-26 ENCOUNTER — Ambulatory Visit (INDEPENDENT_AMBULATORY_CARE_PROVIDER_SITE_OTHER): Payer: BC Managed Care – PPO | Admitting: Urology

## 2020-12-26 ENCOUNTER — Other Ambulatory Visit: Payer: Self-pay

## 2020-12-26 VITALS — BP 114/71 | HR 80

## 2020-12-26 DIAGNOSIS — N201 Calculus of ureter: Secondary | ICD-10-CM

## 2020-12-26 LAB — URINALYSIS, COMPLETE
Bilirubin, UA: NEGATIVE
Glucose, UA: NEGATIVE
Ketones, UA: NEGATIVE
Nitrite, UA: NEGATIVE
Specific Gravity, UA: 1.025 (ref 1.005–1.030)
Urobilinogen, Ur: 1 mg/dL (ref 0.2–1.0)
pH, UA: 6 (ref 5.0–7.5)

## 2020-12-26 LAB — MICROSCOPIC EXAMINATION: RBC, Urine: >30 /hpf — AB (ref 0–2)

## 2020-12-26 MED ORDER — CEPHALEXIN 250 MG PO CAPS
500.0000 mg | ORAL_CAPSULE | Freq: Once | ORAL | Status: AC
  Administered 2020-12-26: 500 mg via ORAL

## 2020-12-26 NOTE — Progress Notes (Signed)
   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  There were no vitals taken for this visit. NED. A&Ox3.   No respiratory distress   Abd soft, NT, ND Normal external genitalia with patent urethral meatus  Cystoscopy/ Stent removal procedure  Patient identification was confirmed, informed consent was obtained, and patient was prepped using Betadine solution.  Lidocaine jelly was administered per urethral meatus.    Preoperative abx where received prior to procedure.    Procedure: - Flexible cystoscope introduced, without any difficulty.   - Thorough search of the bladder revealed:    normal urethral meatus  Stent seen emanating from  right  ureteral orifice, grasped with stent graspers, and removed in entirety.     Post-Procedure: - Patient tolerated the procedure well   Assessment/ Plan:  1. Obstruction of right ureteropelvic junction (UPJ) due to stone  Status post right ureteroscopy with laser lithotripsy  Stent removed today without complication  Give him periprocedural antibiotic at the time of stent removal today  Will have her follow-up in 4 weeks with renal ultrasound prior  Warning symptoms reviewed - Urinalysis, Complete    Vanna Scotland, MD

## 2020-12-26 NOTE — Anesthesia Postprocedure Evaluation (Signed)
Anesthesia Post Note  Patient: Arlyce Dice  Procedure(s) Performed: CYSTOSCOPY/URETEROSCOPY/HOLMIUM LASER/STENT PLACEMENT (Right: Ureter)  Patient location during evaluation: PACU Anesthesia Type: General Level of consciousness: awake and alert Pain management: pain level controlled Vital Signs Assessment: post-procedure vital signs reviewed and stable Respiratory status: spontaneous breathing, nonlabored ventilation, respiratory function stable and patient connected to nasal cannula oxygen Cardiovascular status: blood pressure returned to baseline and stable Postop Assessment: no apparent nausea or vomiting Anesthetic complications: no   No notable events documented.   Last Vitals:  Vitals:   12/18/20 1702 12/18/20 1719  BP: 108/75 107/61  Pulse: 91 80  Resp: 15 16  Temp: 36.6 C (!) 36.2 C  SpO2: 99% 99%    Last Pain:  Vitals:   12/18/20 1719  TempSrc: Temporal                 Yevette Edwards

## 2021-01-19 ENCOUNTER — Other Ambulatory Visit: Payer: Self-pay

## 2021-01-19 ENCOUNTER — Ambulatory Visit
Admission: RE | Admit: 2021-01-19 | Discharge: 2021-01-19 | Disposition: A | Discharge status: Home / Self Care | Payer: BC Managed Care – PPO | Source: Ambulatory Visit | Attending: Urology | Admitting: Urology

## 2021-01-19 DIAGNOSIS — N201 Calculus of ureter: Secondary | ICD-10-CM | POA: Insufficient documentation

## 2021-01-24 NOTE — Progress Notes (Signed)
01/25/21 9:28 AM   Kristen [name] 20-Apr-1981 [id number]  Referring provider:  [name], MD 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   She is s/p uncomplicated ureteroscopy on 12/18/2020. Intraoperative findings consisted on 1.2 cm right renal pelvic stone obliterated, fragments removed. There were significant amount of dust and stent left without tether   RUS on 01/19/2021 revealed probable small bilateral renal calculi with no hydronephrosis.   She is doing well today and states that she has been experiencing left flank pain.   PMH: Past Medical History:  Diagnosis Date   Agoraphobia with panic attacks    Anxiety    Depression    Dyslipidemia    not requiring meds   Hernia    Kidney stone    Migraine headache     Surgical History: Past Surgical History:  Procedure Laterality Date   CYSTOSCOPY/URETEROSCOPY/HOLMIUM LASER/STENT PLACEMENT Right 12/18/2020   Procedure: CYSTOSCOPY/URETEROSCOPY/HOLMIUM LASER/STENT PLACEMENT;  Surgeon: Vanna Scotland, MD;  Location: [facility];  Service: Urology;  Laterality: Right;   HERNIA REPAIR  1985   INTRAUTERINE DEVICE INSERTION  01/27/2015   Mirena   LAPAROSCOPIC GASTRIC BANDING N/A 12/26/2012   Procedure: LAPAROSCOPIC GASTRIC BANDING REVISION ;  Surgeon: Atilano Ina, MD;  Location: [facility];  Service: General;  Laterality: N/A;   TONSILLECTOMY AND ADENOIDECTOMY  2007    Home Medications:  Allergies as of 01/25/2021       Reactions   Imitrex [sumatriptan] Nausea And Vomiting        Medication List        Accurate as of January 25, 2021  9:28 AM. If you have any questions, ask your nurse or doctor.          STOP taking these medications    oxyCODONE 5 MG/5ML solution Commonly known as:  ROXICODONE Stopped by: Vanna Scotland, MD   phentermine 37.5 MG capsule Stopped by: Vanna Scotland, MD       TAKE these medications    ALPRAZolam 1 MG tablet Commonly known as: XANAX TAKE 1/2 TO 1 TABLET EVERY 4 HOURS AS NEEDED FOR      ANXIETY What changed:  how much to take how to take this when to take this reasons to take this additional instructions   amitriptyline 100 MG tablet Commonly known as: ELAVIL TAKE 1 TABLET DAILY What changed: when to take this   Calcium Carb-Cholecalciferol 500-400 MG-UNIT Tabs Take 1 tablet by mouth daily.   esomeprazole 40 MG capsule Commonly known as: NexIUM Take 1 capsule (40 mg total) by mouth daily.   levonorgestrel 20 MCG/24HR IUD Commonly known as: MIRENA 1 each by Intrauterine route once. Inserted 01/21/2020   multivitamin with minerals tablet Take 1 tablet by mouth daily.   pantoprazole 40 MG tablet Commonly known as: PROTONIX Take 40 mg by mouth at bedtime.   sertraline 100 MG tablet Commonly known as: ZOLOFT Take 1.5 tablets (150 mg total) by mouth daily. What changed: how much to take        Allergies:  Allergies  Allergen Reactions   Imitrex [Sumatriptan] Nausea And Vomiting    Family History: Family History  Problem Relation Age of Onset   Hypertension Father    Stroke Father    Hyperlipidemia Father  Migraines Mother    Cancer Mother        cervical   Hyperlipidemia Mother    Bipolar disorder Sister    Migraines Sister    Breast cancer Paternal Grandmother 59    Social History:  reports that she has never smoked. She has never used smokeless tobacco. She reports that she does not currently use alcohol. She reports that she does not use drugs.   Physical Exam: BP 92/66   Pulse 92   Ht 5' 5.5" (1.664 m)   Wt 183 lb (83 kg)   BMI 29.99 kg/m   Constitutional:  Alert and oriented, No acute distress. HEENT: [place] AT, moist mucus membranes.  Trachea midline, no masses. Cardiovascular: No  clubbing, cyanosis, or edema. Respiratory: Normal respiratory effort, no increased work of breathing. Skin: No rashes, bruises or suspicious lesions. Neurologic: Grossly intact, no focal deficits, moving all 4 extremities. Psychiatric: Normal mood and affect.  Laboratory Data:  Lab Results  Component Value Date   CREATININE 0.63 08/29/2020    Pertinent Imaging: CLINICAL DATA:  Renal stone evaluation.   EXAM: RENAL / URINARY TRACT ULTRASOUND COMPLETE   COMPARISON:  CT abdomen pelvis 12/12/2020   FINDINGS: Right Kidney:   Renal measurements: 11.4 x 5.2 x 5.0 cm = volume: 157 mL. Echogenicity within normal limits. No mass or hydronephrosis visualized. There is an echogenic focus measuring 4 mm in the midpole, likely a renal stone.   Left Kidney:   Renal measurements: 10.8 x 5.4 x 5.5 cm = volume: 168 mL. Echogenicity within normal limits. No mass or hydronephrosis visualized. There is an echogenic focus in the midpole measuring 5 mm, likely a renal stone.   Bladder:   Appears normal for degree of bladder distention.   Other:   None.   IMPRESSION: Probable small bilateral renal calculi.  No hydronephrosis.     Electronically Signed   By: Emmaline Kluver M.D.   On: 01/21/2021 17:46   Renal ultrasound was personally reviewed.  This is also compared to her preoperative CT scan.   Assessment & Plan:    Obstruction of right ureteropelvic junction (UPJ) due to stone  - S/p right ureteroscopy with laser lithotripsy   - RUS showed no hydronephrosis   - We discussed general stone prevention techniques including drinking plenty water with goal of producing 2.5 L urine daily, increased citric acid intake, avoidance of high oxalate containing foods, and decreased salt intake.  Information about dietary recommendations given today. Advised her that is she is taking a calcium supplement then she should stop taking TUMS.   -Discussed the option of 24-hour urine  metabolic evaluation, declined at this time.  She will try behavioral modification first.  2.  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  Return in about 6 months (around 07/25/2021) for KUB.  I,Kailey Littlejohn,acting as a scribe for Vanna Scotland, MD.,have documented all relevant documentation on the behalf of Vanna Scotland, MD,as directed by  Vanna Scotland, MD while in the presence of Vanna Scotland, MD.   [facility] [address], [address] [place], [place] [address] [phone]  I have reviewed the above documentation for accuracy and completeness, and I  agree with the above.   Vanna Scotland, MD  I spent 31 total minutes on the day of the encounter including pre-visit review of the medical record, face-to-face time with the patient, and post visit ordering of labs/imaging/tests.

## 2021-01-25 ENCOUNTER — Encounter: Payer: Self-pay | Admitting: Urology

## 2021-01-25 ENCOUNTER — Other Ambulatory Visit: Payer: Self-pay

## 2021-01-25 ENCOUNTER — Ambulatory Visit (INDEPENDENT_AMBULATORY_CARE_PROVIDER_SITE_OTHER): Payer: BC Managed Care – PPO | Admitting: Urology

## 2021-01-25 VITALS — BP 92/66 | HR 92 | Ht 65.5 in | Wt 183.0 lb

## 2021-01-25 DIAGNOSIS — R109 Unspecified abdominal pain: Secondary | ICD-10-CM

## 2021-01-25 DIAGNOSIS — N201 Calculus of ureter: Secondary | ICD-10-CM

## 2021-03-04 ENCOUNTER — Emergency Department: Payer: BC Managed Care – PPO

## 2021-03-04 ENCOUNTER — Inpatient Hospital Stay
Admission: EM | Admit: 2021-03-04 | Discharge: 2021-03-08 | DRG: 390 | Disposition: A | Discharge status: Home / Self Care | Payer: BC Managed Care – PPO | Attending: Internal Medicine | Admitting: Internal Medicine

## 2021-03-04 ENCOUNTER — Other Ambulatory Visit: Payer: Self-pay

## 2021-03-04 DIAGNOSIS — K56609 Unspecified intestinal obstruction, unspecified as to partial versus complete obstruction: Secondary | ICD-10-CM

## 2021-03-04 DIAGNOSIS — Z6829 Body mass index (BMI) 29.0-29.9, adult: Secondary | ICD-10-CM | POA: Diagnosis not present

## 2021-03-04 DIAGNOSIS — F4001 Agoraphobia with panic disorder: Secondary | ICD-10-CM | POA: Diagnosis present

## 2021-03-04 DIAGNOSIS — Z818 Family history of other mental and behavioral disorders: Secondary | ICD-10-CM | POA: Diagnosis not present

## 2021-03-04 DIAGNOSIS — E860 Dehydration: Secondary | ICD-10-CM | POA: Diagnosis present

## 2021-03-04 DIAGNOSIS — E663 Overweight: Secondary | ICD-10-CM | POA: Diagnosis present

## 2021-03-04 DIAGNOSIS — Z9884 Bariatric surgery status: Secondary | ICD-10-CM

## 2021-03-04 DIAGNOSIS — E876 Hypokalemia: Secondary | ICD-10-CM | POA: Diagnosis present

## 2021-03-04 DIAGNOSIS — F419 Anxiety disorder, unspecified: Secondary | ICD-10-CM

## 2021-03-04 DIAGNOSIS — R112 Nausea with vomiting, unspecified: Secondary | ICD-10-CM | POA: Diagnosis present

## 2021-03-04 DIAGNOSIS — Z20822 Contact with and (suspected) exposure to covid-19: Secondary | ICD-10-CM | POA: Diagnosis present

## 2021-03-04 DIAGNOSIS — K5651 Intestinal adhesions [bands], with partial obstruction: Principal | ICD-10-CM | POA: Diagnosis present

## 2021-03-04 DIAGNOSIS — Z975 Presence of (intrauterine) contraceptive device: Secondary | ICD-10-CM

## 2021-03-04 DIAGNOSIS — R197 Diarrhea, unspecified: Secondary | ICD-10-CM | POA: Diagnosis present

## 2021-03-04 DIAGNOSIS — Z888 Allergy status to other drugs, medicaments and biological substances status: Secondary | ICD-10-CM | POA: Diagnosis not present

## 2021-03-04 DIAGNOSIS — G43909 Migraine, unspecified, not intractable, without status migrainosus: Secondary | ICD-10-CM | POA: Diagnosis present

## 2021-03-04 DIAGNOSIS — F32A Depression, unspecified: Secondary | ICD-10-CM | POA: Diagnosis present

## 2021-03-04 DIAGNOSIS — K219 Gastro-esophageal reflux disease without esophagitis: Secondary | ICD-10-CM | POA: Diagnosis present

## 2021-03-04 DIAGNOSIS — Z79899 Other long term (current) drug therapy: Secondary | ICD-10-CM | POA: Diagnosis not present

## 2021-03-04 DIAGNOSIS — D649 Anemia, unspecified: Secondary | ICD-10-CM | POA: Diagnosis present

## 2021-03-04 DIAGNOSIS — Z87442 Personal history of urinary calculi: Secondary | ICD-10-CM | POA: Diagnosis not present

## 2021-03-04 HISTORY — DX: Nausea with vomiting, unspecified: R11.2

## 2021-03-04 HISTORY — DX: Unspecified intestinal obstruction, unspecified as to partial versus complete obstruction: K56.609

## 2021-03-04 LAB — URINALYSIS, COMPLETE (UACMP) WITH MICROSCOPIC
Bacteria, UA: NONE SEEN
Glucose, UA: NEGATIVE mg/dL
Hgb urine dipstick: NEGATIVE
Ketones, ur: 20 mg/dL — AB
Nitrite: NEGATIVE
Protein, ur: 100 mg/dL — AB
Specific Gravity, Urine: 1.023 (ref 1.005–1.030)
pH: 5 (ref 5.0–8.0)

## 2021-03-04 LAB — COMPREHENSIVE METABOLIC PANEL
ALT: 15 U/L (ref 0–44)
AST: 29 U/L (ref 15–41)
Albumin: 5.1 g/dL — ABNORMAL HIGH (ref 3.5–5.0)
Alkaline Phosphatase: 81 U/L (ref 38–126)
Anion gap: 17 — ABNORMAL HIGH (ref 5–15)
BUN: 9 mg/dL (ref 6–20)
CO2: 19 mmol/L — ABNORMAL LOW (ref 22–32)
Calcium: 10.6 mg/dL — ABNORMAL HIGH (ref 8.9–10.3)
Chloride: 102 mmol/L (ref 98–111)
Creatinine, Ser: 0.78 mg/dL (ref 0.44–1.00)
GFR, Estimated: >60 mL/min (ref >60)
Glucose, Bld: 129 mg/dL — ABNORMAL HIGH (ref 70–99)
Potassium: 3.2 mmol/L — ABNORMAL LOW (ref 3.5–5.1)
Sodium: 138 mmol/L (ref 135–145)
Total Bilirubin: 1.4 mg/dL — ABNORMAL HIGH (ref 0.3–1.2)
Total Protein: 8.6 g/dL — ABNORMAL HIGH (ref 6.5–8.1)

## 2021-03-04 LAB — CBC
HCT: 45.4 % (ref 36.0–46.0)
Hemoglobin: 16.2 g/dL — ABNORMAL HIGH (ref 12.0–15.0)
MCH: 33 pg (ref 26.0–34.0)
MCHC: 35.7 g/dL (ref 30.0–36.0)
MCV: 92.5 fL (ref 80.0–100.0)
Platelets: 377 10*3/uL (ref 150–400)
RBC: 4.91 MIL/uL (ref 3.87–5.11)
RDW: 11.8 % (ref 11.5–15.5)
WBC: 19.5 10*3/uL — ABNORMAL HIGH (ref 4.0–10.5)
nRBC: 0 % (ref 0.0–0.2)

## 2021-03-04 LAB — LIPASE, BLOOD: Lipase: 33 U/L (ref 11–51)

## 2021-03-04 LAB — RESP PANEL BY RT-PCR (FLU A&B, COVID) ARPGX2
Influenza A by PCR: NEGATIVE
Influenza B by PCR: NEGATIVE
SARS Coronavirus 2 by RT PCR: NEGATIVE

## 2021-03-04 LAB — POC URINE PREG, ED: Preg Test, Ur: NEGATIVE

## 2021-03-04 MED ORDER — IOHEXOL 300 MG/ML  SOLN
100.0000 mL | Freq: Once | INTRAMUSCULAR | Status: AC | PRN
  Administered 2021-03-04: 100 mL via INTRAVENOUS

## 2021-03-04 MED ORDER — LORAZEPAM 2 MG/ML IJ SOLN
0.5000 mg | INTRAMUSCULAR | Status: DC | PRN
  Administered 2021-03-04 – 2021-03-06 (×6): 0.5 mg via INTRAVENOUS
  Filled 2021-03-04 (×6): qty 1

## 2021-03-04 MED ORDER — MORPHINE SULFATE (PF) 4 MG/ML IV SOLN
4.0000 mg | Freq: Once | INTRAVENOUS | Status: AC
Start: 2021-03-04 — End: 2021-03-04
  Administered 2021-03-04: 4 mg via INTRAVENOUS
  Filled 2021-03-04: qty 1

## 2021-03-04 MED ORDER — ONDANSETRON HCL 4 MG/2ML IJ SOLN
4.0000 mg | Freq: Once | INTRAMUSCULAR | Status: AC
  Administered 2021-03-04: 4 mg via INTRAVENOUS
  Filled 2021-03-04: qty 2

## 2021-03-04 MED ORDER — ONDANSETRON HCL 4 MG/2ML IJ SOLN
4.0000 mg | Freq: Three times a day (TID) | INTRAMUSCULAR | Status: DC | PRN
  Administered 2021-03-04 – 2021-03-07 (×6): 4 mg via INTRAVENOUS
  Filled 2021-03-04 (×6): qty 2

## 2021-03-04 MED ORDER — FENTANYL CITRATE PF 50 MCG/ML IJ SOSY
50.0000 ug | PREFILLED_SYRINGE | INTRAMUSCULAR | Status: AC | PRN
  Administered 2021-03-04 – 2021-03-05 (×2): 50 ug via INTRAVENOUS
  Filled 2021-03-04 (×2): qty 1

## 2021-03-04 MED ORDER — HEPARIN SODIUM (PORCINE) 5000 UNIT/ML IJ SOLN
5000.0000 [IU] | Freq: Three times a day (TID) | INTRAMUSCULAR | Status: DC
  Administered 2021-03-04 – 2021-03-08 (×11): 5000 [IU] via SUBCUTANEOUS
  Filled 2021-03-04 (×11): qty 1

## 2021-03-04 MED ORDER — SODIUM CHLORIDE 0.9 % IV BOLUS
1000.0000 mL | Freq: Once | INTRAVENOUS | Status: AC
  Administered 2021-03-04: 1000 mL via INTRAVENOUS

## 2021-03-04 MED ORDER — SODIUM CHLORIDE 0.9 % IV SOLN: 8.0000 mg | Freq: Three times a day (TID) | INTRAVENOUS | Status: DC | PRN

## 2021-03-04 MED ORDER — HYDROMORPHONE HCL 1 MG/ML IJ SOLN
1.0000 mg | Freq: Four times a day (QID) | INTRAMUSCULAR | Status: DC | PRN
Start: 2021-03-04 — End: 2021-03-05
  Administered 2021-03-04 – 2021-03-05 (×2): 1 mg via INTRAVENOUS
  Filled 2021-03-04 (×2): qty 1

## 2021-03-04 MED ORDER — HYDROMORPHONE HCL 1 MG/ML IJ SOLN
1.0000 mg | Freq: Once | INTRAMUSCULAR | Status: AC
Start: 2021-03-04 — End: 2021-03-04
  Administered 2021-03-04: 1 mg via INTRAVENOUS
  Filled 2021-03-04: qty 1

## 2021-03-04 MED ORDER — SODIUM CHLORIDE 0.9 % IV SOLN
12.5000 mg | Freq: Four times a day (QID) | INTRAVENOUS | Status: DC | PRN
  Administered 2021-03-04 – 2021-03-05 (×2): 12.5 mg via INTRAVENOUS
  Filled 2021-03-04 (×2): qty 0.5
  Filled 2021-03-04: qty 12.5
  Filled 2021-03-04: qty 0.5
  Filled 2021-03-04: qty 12.5

## 2021-03-04 MED ORDER — SODIUM CHLORIDE 0.9 % IV SOLN: INTRAVENOUS | Status: AC

## 2021-03-04 MED ORDER — PANTOPRAZOLE SODIUM 40 MG IV SOLR
40.0000 mg | Freq: Two times a day (BID) | INTRAVENOUS | Status: DC
  Administered 2021-03-04 – 2021-03-08 (×8): 40 mg via INTRAVENOUS
  Filled 2021-03-04 (×8): qty 40

## 2021-03-04 NOTE — ED Notes (Signed)
Patient reporting increased nausea and anxiety due to NG tube.  PRN medications administered.  Patient readjusted in bed.  Lights in room dimmed

## 2021-03-04 NOTE — ED Notes (Signed)
Patient repositioned in bed for comfort.  External catheter placed and teaching preformed.  Patient updated on plan of care

## 2021-03-04 NOTE — ED Provider Notes (Signed)
[facility] Emergency Department Provider Note  Time seen: 4:43 PM  I have reviewed the triage vital signs and the nursing notes.   HISTORY  Chief Complaint Abdominal Pain   HPI Kristen [name] is a 40 y.o. female with a past medical history of anxiety, depression, kidney stones, migraines, presents emergency department for left sided abdominal pain.  According to the patient over the past 2 to 3 days she has been experiencing significant left-sided abdominal pain, nausea vomiting and diarrhea.  States liquid stool.  No black or bloody.  No dysuria.  No fever.  Patient states the left-sided abdominal pain significantly worsened this morning around 1:30 AM currently describes as severe sharp and aching pain along the entire left abdomen.  States a history of kidney stones but denies feeling anything like this previously.  Denies any colitis or diverticulitis.   Past Medical History:  Diagnosis Date   Agoraphobia with panic attacks    Anxiety    Depression    Dyslipidemia    not requiring meds   Hernia    Kidney stone    Migraine headache     Patient Active Problem List   Diagnosis Date Noted   Nephrolithiasis 12/25/2020   Chronic anxiety 07/09/2019   Headache, migraine 07/09/2019   Panic disorder without agoraphobia 07/09/2019   Overweight (BMI 25.0-29.9) 04/14/2013   History of laparoscopic adjustable gastric banding 01/08/11, Revised 12/25/12 due to slip 12/25/2012    Past Surgical History:  Procedure Laterality Date   CYSTOSCOPY/URETEROSCOPY/HOLMIUM LASER/STENT PLACEMENT Right 12/18/2020   Procedure: CYSTOSCOPY/URETEROSCOPY/HOLMIUM LASER/STENT PLACEMENT;  Surgeon: Vanna Scotland, MD;  Location: [facility] OR[facility];  Service: Urology;  Laterality: Right;   HERNIA REPAIR  1985   INTRAUTERINE DEVICE INSERTION  01/27/2015   Mirena   LAPAROSCOPIC GASTRIC BANDING N/A 12/26/2012   Procedure: LAPAROSCOPIC GASTRIC BANDING REVISION ;  Surgeon: Atilano Ina, MD;  Location:  [facility];  Service: General;  Laterality: N/A;   TONSILLECTOMY AND ADENOIDECTOMY  2007    Prior to Admission medications   Medication Sig Start Date End Date Taking? Authorizing Provider  ALPRAZolam (XANAX) 1 MG tablet TAKE 1/2 TO 1 TABLET EVERY 4 HOURS AS NEEDED FOR      ANXIETY Patient taking differently: Take 1 mg by mouth 3 (three) times daily as needed for anxiety. 04/18/20   [name], MD  amitriptyline (ELAVIL) 100 MG tablet TAKE 1 TABLET DAILY Patient taking differently: Take 100 mg by mouth at bedtime. 10/15/20   [name], MD  Calcium Carb-Cholecalciferol 500-400 MG-UNIT TABS Take 1 tablet by mouth daily.    [provider]  esomeprazole (NEXIUM) 40 MG capsule Take 1 capsule (40 mg total) by mouth daily. 04/18/20   [name], MD  levonorgestrel (MIRENA) 20 MCG/24HR IUD 1 each by Intrauterine route once. Inserted 01/21/2020    [provider]  Multiple Vitamins-Minerals (MULTIVITAMIN WITH MINERALS) tablet Take 1 tablet by mouth daily.    [provider]  pantoprazole (PROTONIX) 40 MG tablet Take 40 mg by mouth at bedtime. 05/11/20   [provider]  sertraline (ZOLOFT) 100 MG tablet Take 1.5 tablets (150 mg total) by mouth daily. Patient taking differently: Take 100 mg by mouth daily. 09/15/20 12/09/21  [name], MD    Allergies  Allergen Reactions   Imitrex [Sumatriptan] Nausea And Vomiting    Family History  Problem Relation Age of Onset   Hypertension Father    Stroke Father    Hyperlipidemia  Father    Migraines Mother    Cancer Mother        cervical   Hyperlipidemia Mother    Bipolar disorder Sister    Migraines Sister    Breast cancer Paternal Grandmother 83    Social History Social History   Tobacco Use   Smoking status: Never   Smokeless tobacco: Never  Vaping Use   Vaping Use: Never used  Substance Use Topics   Alcohol use: Not Currently    Alcohol/week: 0.0 standard drinks   Drug use: No     Review of Systems Constitutional: Negative for fever. Cardiovascular: Negative for chest pain. Respiratory: Negative for shortness of breath. Gastrointestinal: Left-sided abdominal pain.  Positive for nausea vomiting diarrhea Genitourinary: Negative for urinary compaints.  Negative for hematuria. Musculoskeletal: Negative for musculoskeletal complaints Neurological: Negative for headache All other ROS negative  ____________________________________________   PHYSICAL EXAM:  VITAL SIGNS: ED Triage Vitals  Enc Vitals Group     BP 03/04/21 1533 108/84     Pulse Rate 03/04/21 1530 (!) 103     Resp 03/04/21 1530 18     Temp 03/04/21 1530 97.8 F (36.6 C)     Temp Source 03/04/21 1530 Oral     SpO2 03/04/21 1533 95 %     Weight 03/04/21 1531 180 lb (81.6 kg)     Height 03/04/21 1531 5\' 5"  (1.651 m)     Head Circumference --      Peak Flow --      Pain Score 03/04/21 1530 10     Pain Loc --      Pain Edu? --      Excl. in GC? --    Constitutional: Alert and oriented. Well appearing and in no distress. Eyes: Normal exam ENT      Head: Normocephalic and atraumatic.      Mouth/Throat: Mucous membranes are moist. Cardiovascular: Normal rate, regular rhythm. Respiratory: Normal respiratory effort without tachypnea nor retractions. Breath sounds are clear  Gastrointestinal: Soft, moderate left-sided abdominal tenderness to palpation without rebound guarding or distention. Musculoskeletal: Nontender with normal range of motion in all extremities.  Neurologic:  Normal speech and language. No gross focal neurologic deficits  Skin:  Skin is warm, dry and intact.  Psychiatric: Mood and affect are normal.  ____________________________________________    EKG  EKG viewed and interpreted by myself shows sinus tachycardia 116 bpm with a narrow QRS, normal axis, normal intervals, nonspecific ST changes.  ____________________________________________    RADIOLOGY  CT most  consistent with small bowel obstruction with midline transition point.  ____________________________________________   INITIAL IMPRESSION / ASSESSMENT AND PLAN / ED COURSE  Pertinent labs & imaging results that were available during my care of the patient were reviewed by me and considered in my medical decision making (see chart for details).   Patient presents to the emergency department for left-sided abdominal pain nausea vomiting diarrhea.  Symptoms have been ongoing over the past 2 to 3 days.  Patient states severe pain currently along the left abdomen with moderate tenderness to palpation on my examination.  Lab work does show leukocytosis of 19,500.  Differential is quite broad but would include colitis, diverticulitis, ureterolithiasis, UTI or pyelonephritis.  We will obtain a urine sample and a CT scan abdomen/pelvis.  We will treat pain and nausea while awaiting further results.   CT most consistent with small bowel obstruction.  Patient continues to have diarrhea at times suspect partial bowel obstruction.  Discussed the  patient with Dr. Claudine Mouton of general surgery who recommends NG tube and medical admission which I believe is reasonable as well.  We will place an NG tube to low intermittent suction and admit to the hospital service.   Domanique Huesman was evaluated in Emergency Department on 03/04/2021 for the symptoms described in the history of present illness. She was evaluated in the context of the global COVID-19 pandemic, which necessitated consideration that the patient might be at risk for infection with the SARS-CoV-2 virus that causes COVID-19. Institutional protocols and algorithms that pertain to the evaluation of patients at risk for COVID-19 are in a state of rapid change based on information released by regulatory bodies including the CDC and federal and state organizations. These policies and algorithms were followed during the patient's care in the  ED.  ____________________________________________   FINAL CLINICAL IMPRESSION(S) / ED DIAGNOSES  Left-sided abdominal pain Partial small bowel obstruction   Minna Antis, MD 03/04/21 1911

## 2021-03-04 NOTE — H&P (Addendum)
History and Physical    Kristen [name] TOI:[id number] DOB: [birth year] DOA: 03/04/2021  PCP: [name], MD    Patient coming from:  Home    Chief Complaint:  N/V/D   HPI: Kristen [name] is a 40 y.o. female seen in ed with complaints of left sided abd pain/ 10/10, sharp stabbing, n/a,chills, diarrhea ,since Friday, intermittent. Pt otherwise denies any alleviating or aggravating factors and has loss of appetite.   Pt has past medical history of anxiety, gastric lap band,nephrolithiasis.   ED Course:  Vitals:   03/04/21 1745 03/04/21 1755 03/04/21 1850 03/04/21 1900  BP: 114/74 112/86 119/80 (!) 119/95  Pulse: 83 91 92 81  Resp: 19 12 18 16   Temp:      TempSrc:      SpO2: 100% 99% 100% 99%  Weight:      Height:      In ed pt is crying and has throat pain and abdominal pain and was given fentanyl and dilaudid.  Labs shows cloudy urine o/w negative.  Cmp show hypercalcemia of 10.6 , hypokalemia/ AG of 17 , TB of 1.7 ,and  cbc shows wbc count of 19.5, hb of 16.2, platelet count of 377. CT abd c/w SBO.  Review of Systems:  Review of Systems  Constitutional: Negative.   HENT: Negative.    Eyes: Negative.   Respiratory: Negative.    Cardiovascular: Negative.   Gastrointestinal:  Positive for abdominal pain, diarrhea, nausea and vomiting.  Genitourinary: Negative.   Musculoskeletal: Negative.   Skin: Negative.   Neurological: Negative.   All other systems reviewed and are negative.   Past Medical History:  Diagnosis Date   Agoraphobia with panic attacks    Anxiety    Depression    Dyslipidemia    not requiring meds   Hernia    Kidney stone    Migraine headache     Past Surgical History:  Procedure Laterality Date   CYSTOSCOPY/URETEROSCOPY/HOLMIUM LASER/STENT PLACEMENT Right 12/18/2020   Procedure: CYSTOSCOPY/URETEROSCOPY/HOLMIUM LASER/STENT PLACEMENT;  Surgeon: 02/17/2021, MD;  Location: [facility] ORS;  Service: Urology;  Laterality: Right;   HERNIA  REPAIR  1985   INTRAUTERINE DEVICE INSERTION  01/27/2015   Mirena   LAPAROSCOPIC GASTRIC BANDING N/A 12/26/2012   Procedure: LAPAROSCOPIC GASTRIC BANDING REVISION ;  Surgeon: 02/25/2013, MD;  Location: [facility];  Service: General;  Laterality: N/A;   TONSILLECTOMY AND ADENOIDECTOMY  2007     reports that she has never smoked. She has never used smokeless tobacco. She reports that she does not currently use alcohol. She reports that she does not use drugs.  Allergies  Allergen Reactions   Imitrex [Sumatriptan] Nausea And Vomiting    Family History  Problem Relation Age of Onset   Hypertension Father    Stroke Father    Hyperlipidemia Father    Migraines Mother    Cancer Mother        cervical   Hyperlipidemia Mother    Bipolar disorder Sister    Migraines Sister    Breast cancer Paternal Grandmother 35    Prior to Admission medications   Medication Sig Start Date End Date Taking? Authorizing Provider  ALPRAZolam (XANAX) 1 MG tablet TAKE 1/2 TO 1 TABLET EVERY 4 HOURS AS NEEDED FOR      ANXIETY Patient taking differently: Take 1 mg by mouth 3 (three) times daily as needed for anxiety. 04/18/20   04/20/20, MD  amitriptyline (ELAVIL) 100 MG tablet TAKE  1 TABLET DAILY Patient taking differently: Take 100 mg by mouth at bedtime. 10/15/20   [name], MD  Calcium Carb-Cholecalciferol 500-400 MG-UNIT TABS Take 1 tablet by mouth daily.    [provider]  esomeprazole (NEXIUM) 40 MG capsule Take 1 capsule (40 mg total) by mouth daily. 04/18/20   [name], MD  levonorgestrel (MIRENA) 20 MCG/24HR IUD 1 each by Intrauterine route once. Inserted 01/21/2020    [provider]  Multiple Vitamins-Minerals (MULTIVITAMIN WITH MINERALS) tablet Take 1 tablet by mouth daily.    [provider]  pantoprazole (PROTONIX) 40 MG tablet Take 40 mg by mouth at bedtime. 05/11/20   [provider]  sertraline (ZOLOFT) 100 MG tablet Take 1.5 tablets (150  mg total) by mouth daily. Patient taking differently: Take 100 mg by mouth daily. 09/15/20 12/09/21  [name], MD    Physical Exam: Vitals:   03/04/21 1745 03/04/21 1755 03/04/21 1850 03/04/21 1900  BP: 114/74 112/86 119/80 (!) 119/95  Pulse: 83 91 92 81  Resp: 19 12 18 16   Temp:      TempSrc:      SpO2: 100% 99% 100% 99%  Weight:      Height:       Physical Exam Vitals and nursing note reviewed.  Constitutional:      General: She is not in acute distress.    Appearance: She is ill-appearing. She is not toxic-appearing or diaphoretic.  HENT:     Head: Normocephalic and atraumatic.     Right Ear: External ear normal.     Left Ear: External ear normal.     Nose: Nose normal.     Mouth/Throat:     Mouth: Mucous membranes are moist.  Eyes:     Extraocular Movements: Extraocular movements intact.     Pupils: Pupils are equal, round, and reactive to light.  Cardiovascular:     Rate and Rhythm: Normal rate and regular rhythm.     Pulses: Normal pulses.     Heart sounds: Normal heart sounds.  Pulmonary:     Effort: Pulmonary effort is normal.     Breath sounds: Normal breath sounds.  Abdominal:     General: Bowel sounds are normal. There is no distension.     Palpations: Abdomen is soft. There is no mass.     Tenderness: There is abdominal tenderness. There is no guarding.  Musculoskeletal:     Right lower leg: No edema.     Left lower leg: No edema.  Skin:    General: Skin is warm.  Neurological:     General: No focal deficit present.     Mental Status: She is alert and oriented to person, place, and time.  Psychiatric:        Mood and Affect: Mood normal.        Behavior: Behavior normal.   Labs on Admission: I have personally reviewed following labs and imaging studies  No results for input(s): CKTOTAL, CKMB, TROPONINI in the last 72 hours. Lab Results  Component Value Date   WBC 19.5 (H) 03/04/2021   HGB 16.2 (H) 03/04/2021   HCT 45.4 03/04/2021   MCV  92.5 03/04/2021   PLT 377 03/04/2021    Recent Labs  Lab 03/04/21 1534  NA 138  K 3.2*  CL 102  CO2 19*  BUN 9  CREATININE 0.78  CALCIUM 10.6*  PROT 8.6*  BILITOT 1.4*  ALKPHOS 81  ALT 15  AST 29  GLUCOSE 129*   Lab Results  Component Value Date   CHOL 210 (H) 08/29/2020   HDL 71 08/29/2020   LDLCALC 121 (H) 08/29/2020   TRIG 82 08/29/2020   No results found for: DDIMER Invalid input(s): POCBNP  Urinalysis    Component Value Date/Time   COLORURINE AMBER (A) 03/04/2021 1729   APPEARANCEUR CLOUDY (A) 03/04/2021 1729   APPEARANCEUR Cloudy (A) 12/26/2020 0828   LABSPEC 1.023 03/04/2021 1729   LABSPEC 1.025 10/21/2011 2131   PHURINE 5.0 03/04/2021 1729   GLUCOSEU NEGATIVE 03/04/2021 1729   GLUCOSEU Negative 10/21/2011 2131   HGBUR NEGATIVE 03/04/2021 1729   BILIRUBINUR SMALL (A) 03/04/2021 1729   BILIRUBINUR Negative 12/26/2020 0828   BILIRUBINUR Negative 10/21/2011 2131   KETONESUR 20 (A) 03/04/2021 1729   PROTEINUR 100 (A) 03/04/2021 1729   UROBILINOGEN 2.0 11/30/2015 1054   UROBILINOGEN 1 07/14/2014 1009   NITRITE NEGATIVE 03/04/2021 1729   LEUKOCYTESUR TRACE (A) 03/04/2021 1729   LEUKOCYTESUR Negative 10/21/2011 2131    COVID-19 Labs No results for input(s): DDIMER, FERRITIN, LDH, CRP in the last 72 hours. No results found for: SARSCOV2NAA  Radiological Exams on Admission: DG Abdomen 1 View  Result Date: 03/04/2021 CLINICAL DATA:  NG tube placement EXAM: ABDOMEN - 1 VIEW COMPARISON:  12/12/2020 FINDINGS: NG tube tip is in the distal stomach. Lap band device is in place. Gaseous distention of the stomach and bowel diffusely. IMPRESSION: NG tube tip in the distal stomach. Diffuse gaseous distention of bowel including stomach. Electronically Signed   By: Charlett Nose M.D.   On: 03/04/2021 19:29   CT ABDOMEN PELVIS W CONTRAST  Result Date: 03/04/2021 CLINICAL DATA:  Nausea vomiting diarrhea and left upper quadrant pain since Friday. EXAM: CT ABDOMEN AND  PELVIS WITH CONTRAST TECHNIQUE: Multidetector CT imaging of the abdomen and pelvis was performed using the standard protocol following bolus administration of intravenous contrast. CONTRAST:  OMNIPAQUE IOHEXOL 300 MG/ML  SOLN COMPARISON:  CT December 12, 2020 FINDINGS: Lower chest: No acute abnormality. Hepatobiliary: No suspicious hepatic lesion. Gallbladder is unremarkable. No biliary ductal dilation. Pancreas: No pancreatic ductal dilation or evidence of acute inflammation. Spleen: Within normal limits. Adrenals/Urinary Tract: Adrenal glands are unremarkable. No hydronephrosis. Punctate nonobstructive bilateral renal stones. Subcentimeter hypodense bilateral renal lesions which are technically too small to accurately characterize but statistically likely represent cysts. No solid enhancing renal mass. Bladder is unremarkable for degree of distension. Stomach/Bowel: Prior gastric band with reservoir in the right hemiabdomen. Stomach is distended with gas and enteric contents. Dilated loops of small bowel throughout the abdomen measuring up to 3.5 cm with a transition to decompressed bowel in the midline pelvis on image 75/2. Gas fluid levels throughout the colon. Vascular/Lymphatic: No significant vascular findings are present. No enlarged abdominal or pelvic lymph nodes. Reproductive: Intrauterine device appears appropriate in positioning. No suspicious adnexal lesion. Other: No abdominopelvic ascites. Musculoskeletal: No acute or significant osseous findings. IMPRESSION: 1. Dilated loops of small bowel throughout the abdomen measuring up to 3.5 cm with a transition to decompressed bowel in the midline. Findings are most consistent with small bowel obstruction likely due to adhesions. 2. Prior gastric band with reservoir in the right hemiabdomen. 3. Punctate nonobstructive bilateral renal stones. Electronically Signed   By: Maudry Mayhew M.D.   On: 03/04/2021 18:37    EKG: Independently reviewed.  None     Assessment/Plan Principal Problem:   Nausea vomiting and diarrhea Active Problems:   SBO (small bowel obstruction) (HCC)  History of laparoscopic adjustable gastric banding 01/08/11, Revised 12/25/12 due to slip   Chronic anxiety   N/V/D / SBO: Pt admitted to med surg for N/V/D from SBO. NGT in place.Abd xray for placement.  Dr.R[place]burg Gen surg consulted.  MIVF/ IV PPI.  H/O gastric lapband: Stable.   C/H anxiety: Pt on sertraline, elavil, xanax. We have held po meds and started pt on PRN ativan iv.    Hypercalcemia: Due to dehydration. We will cont with MIVF for next 48 hours.   Hypokalemia: Replace and follow.   DVT prophylaxis:  Heparin    Code Status:  Full Code     Family Communication:  ATHZIRY, MILLICAN (Mother)  [phone] (Home Phone)   Disposition Plan:  Home    Consults called:  Gen surgery.   Admission status: Inpatient.     Gertha Calkin MD [facility] [phone] How to contact the [facility] Attending or Consulting provider 7A - 7P or covering provider during after hours 7P -7A, for this patient.    Check the care team in [facility] and look for a) attending/consulting [facility] provider listed and b) the [facility] team listed Log into [email or web address] and use [facility]'s universal password to access. If you do not have the password, please contact the hospital operator. Locate the [facility] provider you are looking for under [facility] and page to a number that you can be directly reached. If you still have difficulty reaching the provider, please page the [facility] (Director on Call) for the Hospitalists listed on amion for assistance. [email or web address] Password [facility] 03/04/2021, 7:34 PM

## 2021-03-04 NOTE — ED Triage Notes (Signed)
Pt comes ems from home with abd pain. Pt hyperventilating, refusing to answer EMS questions, crying in triage. N/v/d and LUQ pain starting Friday. Does not go anywhere. 4mg  zofran IV. 22 LAC. Ems states some fecal incontinence. Feels better laying down vs sitting up. VSS. CBG 152. Hx of kidney stones.

## 2021-03-05 DIAGNOSIS — R112 Nausea with vomiting, unspecified: Secondary | ICD-10-CM | POA: Diagnosis not present

## 2021-03-05 DIAGNOSIS — K56609 Unspecified intestinal obstruction, unspecified as to partial versus complete obstruction: Secondary | ICD-10-CM

## 2021-03-05 DIAGNOSIS — R197 Diarrhea, unspecified: Secondary | ICD-10-CM | POA: Diagnosis not present

## 2021-03-05 LAB — CBC
HCT: 38.1 % (ref 36.0–46.0)
Hemoglobin: 13.6 g/dL (ref 12.0–15.0)
MCH: 33.8 pg (ref 26.0–34.0)
MCHC: 35.7 g/dL (ref 30.0–36.0)
MCV: 94.8 fL (ref 80.0–100.0)
Platelets: 290 10*3/uL (ref 150–400)
RBC: 4.02 MIL/uL (ref 3.87–5.11)
RDW: 12.1 % (ref 11.5–15.5)
WBC: 16.9 10*3/uL — ABNORMAL HIGH (ref 4.0–10.5)
nRBC: 0 % (ref 0.0–0.2)

## 2021-03-05 LAB — COMPREHENSIVE METABOLIC PANEL
ALT: 12 U/L (ref 0–44)
AST: 17 U/L (ref 15–41)
Albumin: 4 g/dL (ref 3.5–5.0)
Alkaline Phosphatase: 61 U/L (ref 38–126)
Anion gap: 10 (ref 5–15)
BUN: 8 mg/dL (ref 6–20)
CO2: 22 mmol/L (ref 22–32)
Calcium: 8.9 mg/dL (ref 8.9–10.3)
Chloride: 107 mmol/L (ref 98–111)
Creatinine, Ser: 0.69 mg/dL (ref 0.44–1.00)
GFR, Estimated: >60 mL/min (ref >60)
Glucose, Bld: 96 mg/dL (ref 70–99)
Potassium: 3.2 mmol/L — ABNORMAL LOW (ref 3.5–5.1)
Sodium: 139 mmol/L (ref 135–145)
Total Bilirubin: 0.9 mg/dL (ref 0.3–1.2)
Total Protein: 6.5 g/dL (ref 6.5–8.1)

## 2021-03-05 LAB — MAGNESIUM: Magnesium: 1.5 mg/dL — ABNORMAL LOW (ref 1.7–2.4)

## 2021-03-05 LAB — HIV ANTIBODY (ROUTINE TESTING W REFLEX): HIV Screen 4th Generation wRfx: NONREACTIVE

## 2021-03-05 MED ORDER — KETOROLAC TROMETHAMINE 15 MG/ML IJ SOLN
15.0000 mg | Freq: Three times a day (TID) | INTRAMUSCULAR | Status: DC | PRN
  Administered 2021-03-05 – 2021-03-06 (×2): 15 mg via INTRAVENOUS
  Filled 2021-03-05 (×2): qty 1

## 2021-03-05 MED ORDER — MAGNESIUM SULFATE 2 GM/50ML IV SOLN
2.0000 g | Freq: Once | INTRAVENOUS | Status: AC
  Administered 2021-03-05: 2 g via INTRAVENOUS
  Filled 2021-03-05: qty 50

## 2021-03-05 MED ORDER — PHENOL 1.4 % MT LIQD
1.0000 | OROMUCOSAL | Status: DC | PRN
  Administered 2021-03-05: 1 via OROMUCOSAL
  Filled 2021-03-05 (×2): qty 177

## 2021-03-05 MED ORDER — MORPHINE SULFATE (PF) 2 MG/ML IV SOLN
1.0000 mg | INTRAVENOUS | Status: DC | PRN
  Administered 2021-03-05 – 2021-03-06 (×5): 1 mg via INTRAVENOUS
  Filled 2021-03-05 (×6): qty 1

## 2021-03-05 MED ORDER — BENZOCAINE 20 % MT SOLN
1.0000 "application " | Freq: Four times a day (QID) | OROMUCOSAL | Status: DC | PRN
  Administered 2021-03-05: 1 via OROMUCOSAL
  Filled 2021-03-05 (×4): qty 5

## 2021-03-05 MED ORDER — POTASSIUM CHLORIDE 10 MEQ/100ML IV SOLN
10.0000 meq | INTRAVENOUS | Status: AC
  Administered 2021-03-05 (×4): 10 meq via INTRAVENOUS
  Filled 2021-03-05 (×3): qty 100

## 2021-03-05 MED ORDER — BENZOCAINE 20 % MT AERO
1.0000 | INHALATION_SPRAY | Freq: Four times a day (QID) | OROMUCOSAL | Status: DC | PRN
Start: 2021-03-05 — End: 2021-03-05
  Filled 2021-03-05 (×2): qty 57

## 2021-03-05 MED ORDER — BENZOCAINE 20 % MT AERO: 1.0000 "application " | INHALATION_SPRAY | Freq: Four times a day (QID) | OROMUCOSAL | Status: DC | PRN

## 2021-03-05 NOTE — Plan of Care (Signed)
  Problem: Education: Goal: Knowledge of General Education information will improve Description: Including pain rating scale, medication(s)/side effects and non-pharmacologic comfort measures Outcome: Progressing   Problem: Health Behavior/Discharge Planning: Goal: Ability to manage health-related needs will improve Outcome: Progressing   Problem: Clinical Measurements: Goal: Will remain free from infection Outcome: Progressing   

## 2021-03-05 NOTE — Progress Notes (Addendum)
PROGRESS NOTE    Kristen [name]  KVQ:[id number] DOB: 12/06/1980 DOA: 03/04/2021 PCP: [name], MD    Brief Narrative:  Kristen [name] is a 40 year old female with past medical history significant for anxiety/depression, nephrolithiasis, migraine headache status post gastric banding who presented to [facility] ED on 10/16 with progressive left-sided abdominal pain with associated nausea/vomiting and diarrhea.  Onset over the last 2-3 days.  Describes pain as severe, sharp along left abdomen.  Denies dark or bloody stool.  No dysuria.  In the ED, temperature 97.8 F, HR 103, RR 18, BP 108/84, SPO2 95% on room air.  Sodium 138, potassium 3.2, chloride 102, CO2 19, glucose 129, BUN 9, creatinine 0.70, lipase 33, AST 29, ALT 15, total bilirubin 1.4.  WBC 19.5, hemoglobin 16.2, platelets 377.  Urine pregnancy test negative.  COVID-19 PCR negative.  Influenza A/B PCR negative.  Urinalysis with trace leukocytes, negative nitrite, no bacteria, 11-20 WBCs.  CT abdomen/pelvis with contrast with dilated loops of small bowel throughout abdomen measuring up to 3.5 cm with transition point in the midline consistent with small bowel obstruction likely secondary to adhesions, prior gastric band with reservoir right hemiabdomen and punctate nonobstructive bilateral renal stones.  General surgery was consulted.  NG tube placed.  Hospital service consulted for further evaluation and management of acute small bowel obstruction.   Assessment & Plan:   Principal Problem:   Nausea vomiting and diarrhea Active Problems:   History of laparoscopic adjustable gastric banding 01/08/11, Revised 12/25/12 due to slip   Chronic anxiety   SBO (small bowel obstruction) (HCC)   Small bowel obstruction Patient presenting to the ED with 2-3-day history of sharp left-sided abdominal pain associate with nausea cefonicid diarrhea.  CT abdomen/pelvis notable for small bowel obstruction with transition point in the  midline likely secondary to adhesions in the setting of previous gastric band procedure. --General surgery consulted --NG tube to LIWS; monitor gastric output -- N.p.o. except for ice chips --Benzocaine/Chloraseptic spray as needed for NG tube irritation --Serial abdominal exams --Supportive care, IV Zofran, pain control  Leukocytosis Etiology likely reactive in the setting of nausea/vomiting from small bowel obstruction as above.  Unlikely infectious etiology is afebrile. --WBC 19.5>16.9 --Continue IVF hydration with NS at 133mL/hr --NPO as above --CBC daily  Hypokalemia Potassium 3.2, etiology likely secondary to GI loss with nausea/vomiting/diarrhea and poor oral intake in the setting of small bowel obstruction. --Replete potassium today --Check magnesium --Follow electrolytes daily  Anxiety/depression: On sertraline 100 mg p.o. daily, Xanax 1 mg p.o. 3 times daily as needed. --Hold home medications while n.p.o. --Ativan 0.5 mg IV TID prn anxiety  GERD: Protonix 40 mg IV every 12 hours  DVT prophylaxis: heparin injection 5,000 Units Start: 03/04/21 2200 SCDs Start: 03/04/21 1929   Code Status: Full Code Family Communication: Updated patient's sister who is present at bedside this morning  Disposition Plan:  Level of care: Med-Surg Status is: Inpatient  Remains inpatient appropriate because: Continues with small bowel obstruction requiring NG tube for decompression, IV fluid hydration and electrolyte correction for hypokalemia.    Consultants:  General surgery  Procedures:  NG tube placement  Antimicrobials:  None   Subjective: Patient seen examined at bedside, resting comfortably.  Sister present.  States abdominal discomfort much improved following NG tube placement.  Complains of irritation to the back of her throat from the NG tube and requesting throat spray.  No other questions or concerns at this time.  Denies headache, no chest  pain, no shortness of  breath, no current nausea/vomiting/diarrhea, no cough/congestion, no weakness, no fatigue, no paresthesias.  No acute events overnight per nursing staff.  Objective: Vitals:   03/05/21 0400 03/05/21 0519 03/05/21 0549 03/05/21 0823  BP:  104/86 100/64 117/77  Pulse: 85 81 89 (!) 103  Resp: 10 17 18 16   Temp:   98 F (36.7 C) 98.2 F (36.8 C)  TempSrc:   Oral Oral  SpO2: 99% 100% 100% 100%  Weight:      Height:        Intake/Output Summary (Last 24 hours) at 03/05/2021 1008 Last data filed at 03/05/2021 0535 Gross per 24 hour  Intake 1000 ml  Output 0 ml  Net 1000 ml   Filed Weights   03/04/21 1531  Weight: 81.6 kg    Examination:  General exam: Appears calm and comfortable  Respiratory system: Clear to auscultation. Respiratory effort normal.  On room air Cardiovascular system: S1 & S2 heard, RRR. No JVD, murmurs, rubs, gallops or clicks. No pedal edema. Gastrointestinal system: Abdomen is nondistended, soft and nontender. No organomegaly or masses felt.  No bowel sounds appreciated.  NG tube noted in place to low wall intermittent suction Central nervous system: Alert and oriented. No focal neurological deficits. Extremities: Symmetric 5 x 5 power. Skin: No rashes, lesions or ulcers Psychiatry: Judgement and insight appear normal. Mood & affect appropriate.     Data Reviewed: I have personally reviewed following labs and imaging studies  CBC: Recent Labs  Lab 03/04/21 1534 03/05/21 0550  WBC 19.5* 16.9*  HGB 16.2* 13.6  HCT 45.4 38.1  MCV 92.5 94.8  PLT 377 290   Basic Metabolic Panel: Recent Labs  Lab 03/04/21 1534 03/05/21 0550  NA 138 139  K 3.2* 3.2*  CL 102 107  CO2 19* 22  GLUCOSE 129* 96  BUN 9 8  CREATININE 0.78 0.69  CALCIUM 10.6* 8.9  MG  --  1.5*   GFR: Estimated Creatinine Clearance: 97.6 mL/min (by C-G formula based on SCr of 0.69 mg/dL). Liver Function Tests: Recent Labs  Lab 03/04/21 1534 03/05/21 0550  AST 29 17  ALT 15 12   ALKPHOS 81 61  BILITOT 1.4* 0.9  PROT 8.6* 6.5  ALBUMIN 5.1* 4.0   Recent Labs  Lab 03/04/21 1534  LIPASE 33   No results for input(s): AMMONIA in the last 168 hours. Coagulation Profile: No results for input(s): INR, PROTIME in the last 168 hours. Cardiac Enzymes: No results for input(s): CKTOTAL, CKMB, CKMBINDEX, TROPONINI in the last 168 hours. BNP (last 3 results) No results for input(s): PROBNP in the last 8760 hours. HbA1C: No results for input(s): HGBA1C in the last 72 hours. CBG: No results for input(s): GLUCAP in the last 168 hours. Lipid Profile: No results for input(s): CHOL, HDL, LDLCALC, TRIG, CHOLHDL, LDLDIRECT in the last 72 hours. Thyroid Function Tests: No results for input(s): TSH, T4TOTAL, FREET4, T3FREE, THYROIDAB in the last 72 hours. Anemia Panel: No results for input(s): VITAMINB12, FOLATE, FERRITIN, TIBC, IRON, RETICCTPCT in the last 72 hours. Sepsis Labs: No results for input(s): PROCALCITON, LATICACIDVEN in the last 168 hours.  Recent Results (from the past 240 hour(s))  Resp Panel by RT-PCR (Flu A&B, Covid) Nasopharyngeal Swab     Status: None   Collection Time: 03/04/21  7:04 PM   Specimen: Nasopharyngeal Swab; Nasopharyngeal(NP) swabs in vial transport medium  Result Value Ref Range Status   SARS Coronavirus 2 by RT PCR NEGATIVE NEGATIVE Final  Comment: (NOTE) SARS-CoV-2 target nucleic acids are NOT DETECTED.  The SARS-CoV-2 RNA is generally detectable in upper respiratory specimens during the acute phase of infection. The lowest concentration of SARS-CoV-2 viral copies this assay can detect is 138 copies/mL. A negative result does not preclude SARS-Cov-2 infection and should not be used as the sole basis for treatment or other patient management decisions. A negative result may occur with  improper specimen collection/handling, submission of specimen other than nasopharyngeal swab, presence of viral mutation(s) within the areas targeted  by this assay, and inadequate number of viral copies(<138 copies/mL). A negative result must be combined with clinical observations, patient history, and epidemiological information. The expected result is Negative.  Fact Sheet for Patients:  BloggerCourse.com  Fact Sheet for Healthcare Providers:  SeriousBroker.it  This test is no t yet approved or cleared by the [place] FDA and  has been authorized for detection and/or diagnosis of SARS-CoV-2 by FDA under an Emergency Use Authorization (EUA). This EUA will remain  in effect (meaning this test can be used) for the duration of the COVID-19 declaration under Section 564(b)(1) of the Act, 21 U.S.C.section 360bbb-3(b)(1), unless the authorization is terminated  or revoked sooner.       Influenza A by PCR NEGATIVE NEGATIVE Final   Influenza B by PCR NEGATIVE NEGATIVE Final    Comment: (NOTE) The Xpert Xpress SARS-CoV-2/FLU/RSV plus assay is intended as an aid in the diagnosis of influenza from Nasopharyngeal swab specimens and should not be used as a sole basis for treatment. Nasal washings and aspirates are unacceptable for Xpert Xpress SARS-CoV-2/FLU/RSV testing.  Fact Sheet for Patients: BloggerCourse.com  Fact Sheet for Healthcare Providers: SeriousBroker.it  This test is not yet approved or cleared by the [place] FDA and has been authorized for detection and/or diagnosis of SARS-CoV-2 by FDA under an Emergency Use Authorization (EUA). This EUA will remain in effect (meaning this test can be used) for the duration of the COVID-19 declaration under Section 564(b)(1) of the Act, 21 U.S.C. section 360bbb-3(b)(1), unless the authorization is terminated or revoked.  Performed at [facility], [address], [place], [place] [address]          Radiology Studies: DG Abdomen 1 View  Result Date:  03/04/2021 CLINICAL DATA:  NG tube placement EXAM: ABDOMEN - 1 VIEW COMPARISON:  12/12/2020 FINDINGS: NG tube tip is in the distal stomach. Lap band device is in place. Gaseous distention of the stomach and bowel diffusely. IMPRESSION: NG tube tip in the distal stomach. Diffuse gaseous distention of bowel including stomach. Electronically Signed   By: Charlett Nose M.D.   On: 03/04/2021 19:29   CT ABDOMEN PELVIS W CONTRAST  Result Date: 03/04/2021 CLINICAL DATA:  Nausea vomiting diarrhea and left upper quadrant pain since Friday. EXAM: CT ABDOMEN AND PELVIS WITH CONTRAST TECHNIQUE: Multidetector CT imaging of the abdomen and pelvis was performed using the standard protocol following bolus administration of intravenous contrast. CONTRAST:  OMNIPAQUE IOHEXOL 300 MG/ML  SOLN COMPARISON:  CT December 12, 2020 FINDINGS: Lower chest: No acute abnormality. Hepatobiliary: No suspicious hepatic lesion. Gallbladder is unremarkable. No biliary ductal dilation. Pancreas: No pancreatic ductal dilation or evidence of acute inflammation. Spleen: Within normal limits. Adrenals/Urinary Tract: Adrenal glands are unremarkable. No hydronephrosis. Punctate nonobstructive bilateral renal stones. Subcentimeter hypodense bilateral renal lesions which are technically too small to accurately characterize but statistically likely represent cysts. No solid enhancing renal mass. Bladder is unremarkable for degree of distension. Stomach/Bowel: Prior gastric  band with reservoir in the right hemiabdomen. Stomach is distended with gas and enteric contents. Dilated loops of small bowel throughout the abdomen measuring up to 3.5 cm with a transition to decompressed bowel in the midline pelvis on image 75/2. Gas fluid levels throughout the colon. Vascular/Lymphatic: No significant vascular findings are present. No enlarged abdominal or pelvic lymph nodes. Reproductive: Intrauterine device appears appropriate in positioning. No suspicious  adnexal lesion. Other: No abdominopelvic ascites. Musculoskeletal: No acute or significant osseous findings. IMPRESSION: 1. Dilated loops of small bowel throughout the abdomen measuring up to 3.5 cm with a transition to decompressed bowel in the midline. Findings are most consistent with small bowel obstruction likely due to adhesions. 2. Prior gastric band with reservoir in the right hemiabdomen. 3. Punctate nonobstructive bilateral renal stones. Electronically Signed   By: Maudry Mayhew M.D.   On: 03/04/2021 18:37        Scheduled Meds:  heparin  5,000 Units Subcutaneous Q8H   pantoprazole (PROTONIX) IV  40 mg Intravenous Q12H   Continuous Infusions:  sodium chloride 100 mL/hr at 03/05/21 0743   potassium chloride 10 mEq (03/05/21 0921)   promethazine (PHENERGAN) injection (IM or IVPB) Stopped (03/04/21 1800)     LOS: 1 day    Time spent: 39 minutes spent on chart review, discussion with nursing staff, consultants, updating family and interview/physical exam; more than 50% of that time was spent in counseling and/or coordination of care.    Alvira Philips [place], DO [facility] Available via Epic secure chat 7am-7pm After these hours, please refer to coverage provider listed on [email or web address] 03/05/2021, 10:08 AM

## 2021-03-05 NOTE — Consult Note (Signed)
[facility] SURGICAL CONSULTATION NOTE (initial) - cpt: 76226   HISTORY OF PRESENT ILLNESS (HPI):  40 y.o. female presented to [facility] ED overnight for evaluation of abdominal pain. Patient reports the acute onset of umbilical and epigastric abdominal pain on Friday which ultimately radiated to her left abdomen. This has been constant since the onset. She has associated nausea, emesis, and diarrhea as well. No fever, chills, cough, CP, SOB, urinary changes, or blood in her stools. No history of similar in the past. She has a history of laparoscopic gastric ban in 2014 but this is no longer inflated. No other intra-abdominal surgeries. Work up in the ED was concerning for leukocytosis to 19.5K (now 16.9K), renal function normal with sCr - 0.69, hypokalemia to 3.2. CT abdomen/Pelvis was concerning for small bowel obstruction. She was admitted to the medicine service and had NGT placed.   Surgery is consulted by emergency medicine physician Dr. Minna Antis, MD in this context for evaluation and management of SBO.  PAST MEDICAL HISTORY (PMH):  Past Medical History:  Diagnosis Date   Agoraphobia with panic attacks    Anxiety    Depression    Dyslipidemia    not requiring meds   Hernia    Kidney stone    Migraine headache      PAST SURGICAL HISTORY (PSH):  Past Surgical History:  Procedure Laterality Date   CYSTOSCOPY/URETEROSCOPY/HOLMIUM LASER/STENT PLACEMENT Right 12/18/2020   Procedure: CYSTOSCOPY/URETEROSCOPY/HOLMIUM LASER/STENT PLACEMENT;  Surgeon: Vanna Scotland, MD;  Location: [facility] ORS;  Service: Urology;  Laterality: Right;   HERNIA REPAIR  1985   INTRAUTERINE DEVICE INSERTION  01/27/2015   Mirena   LAPAROSCOPIC GASTRIC BANDING N/A 12/26/2012   Procedure: LAPAROSCOPIC GASTRIC BANDING REVISION ;  Surgeon: Atilano Ina, MD;  Location: [facility] ORS;  Service: General;  Laterality: N/A;   TONSILLECTOMY AND ADENOIDECTOMY  2007     MEDICATIONS:  Prior to Admission  medications   Medication Sig Start Date End Date Taking? Authorizing Provider  ALPRAZolam (XANAX) 1 MG tablet TAKE 1/2 TO 1 TABLET EVERY 4 HOURS AS NEEDED FOR      ANXIETY Patient taking differently: Take 1 mg by mouth 3 (three) times daily as needed for anxiety. 04/18/20  Yes [name], MD  amitriptyline (ELAVIL) 100 MG tablet TAKE 1 TABLET DAILY Patient taking differently: Take 100 mg by mouth at bedtime. 10/15/20  Yes [name], MD  Calcium Carb-Cholecalciferol 500-400 MG-UNIT TABS Take 1 tablet by mouth daily.   Yes [provider]  esomeprazole (NEXIUM) 40 MG capsule Take 1 capsule (40 mg total) by mouth daily. 04/18/20  Yes [name], MD  levonorgestrel (MIRENA) 20 MCG/24HR IUD 1 each by Intrauterine route once. Inserted 01/21/2020   Yes [provider]  Multiple Vitamins-Minerals (MULTIVITAMIN WITH MINERALS) tablet Take 1 tablet by mouth daily.   Yes [provider]  pantoprazole (PROTONIX) 40 MG tablet Take 40 mg by mouth at bedtime. 05/11/20  Yes [provider]  phentermine (ADIPEX-P) 37.5 MG tablet Take 37.5 mg by mouth daily. 02/23/21  Yes [provider]  sertraline (ZOLOFT) 100 MG tablet Take 1.5 tablets (150 mg total) by mouth daily. Patient taking differently: Take 100 mg by mouth daily. 09/15/20 12/09/21 Yes Fisher, Demetrios Isaacs, MD     ALLERGIES:  Allergies  Allergen Reactions   Imitrex [Sumatriptan] Nausea And Vomiting     SOCIAL HISTORY:  Social History   Socioeconomic History   Marital status: Divorced    Spouse  name: Not on file   Number of children: Not on file   Years of education: Not on file   Highest education level: Not on file  Occupational History   Not on file  Tobacco Use   Smoking status: Never   Smokeless tobacco: Never  Vaping Use   Vaping Use: Never used  Substance and Sexual Activity   Alcohol use: Not Currently    Alcohol/week: 0.0 standard drinks   Drug use: No   Sexual activity:  Yes    Birth control/protection: I.U.D.  Other Topics Concern   Not on file  Social History Narrative   Not on file   Social Determinants of Health   Financial Resource Strain: Not on file  Food Insecurity: Not on file  Transportation Needs: Not on file  Physical Activity: Not on file  Stress: Not on file  Social Connections: Not on file  Intimate Partner Violence: Not on file     FAMILY HISTORY:  Family History  Problem Relation Age of Onset   Hypertension Father    Stroke Father    Hyperlipidemia Father    Migraines Mother    Cancer Mother        cervical   Hyperlipidemia Mother    Bipolar disorder Sister    Migraines Sister    Breast cancer Paternal Grandmother 35      REVIEW OF SYSTEMS:  Review of Systems  Constitutional:  Negative for chills and fever.  HENT:  Negative for congestion and sore throat.   Respiratory:  Negative for cough and shortness of breath.   Cardiovascular:  Negative for chest pain and palpitations.  Gastrointestinal:  Positive for abdominal pain, diarrhea, nausea and vomiting. Negative for blood in stool, constipation and melena.  Genitourinary:  Negative for dysuria and urgency.  All other systems reviewed and are negative.  VITAL SIGNS:  Temp:  [97.8 F (36.6 C)-98 F (36.7 C)] 98 F (36.7 C) (10/17 0549) Pulse Rate:  [81-107] 89 (10/17 0549) Resp:  [10-20] 18 (10/17 0549) BP: (99-129)/(64-95) 100/64 (10/17 0549) SpO2:  [94 %-100 %] 100 % (10/17 0549) Weight:  [81.6 kg] 81.6 kg (10/16 1531)     Height: 5\' 5"  (165.1 cm) Weight: 81.6 kg BMI (Calculated): 29.95   INTAKE/OUTPUT:  10/16 0701 - 10/17 0700 In: 1000 [IV Piggyback:1000] Out: 0   PHYSICAL EXAM:  Physical Exam Vitals and nursing note reviewed. Exam conducted with a chaperone present.  Constitutional:      General: She is not in acute distress.    Appearance: She is well-developed. She is obese. She is not ill-appearing.  HENT:     Head: Normocephalic and atraumatic.      Comments: NGT in place    Mouth/Throat:     Mouth: Mucous membranes are moist.     Pharynx: Oropharynx is clear.  Eyes:     General: No scleral icterus.    Extraocular Movements: Extraocular movements intact.  Cardiovascular:     Rate and Rhythm: Normal rate and regular rhythm.     Heart sounds: Normal heart sounds. No murmur heard. Pulmonary:     Effort: Pulmonary effort is normal. No respiratory distress.     Breath sounds: Normal breath sounds.  Abdominal:     General: A surgical scar is present. There is no distension.     Palpations: Abdomen is soft.     Tenderness: There is abdominal tenderness in the epigastric area, periumbilical area and left upper quadrant. There is no guarding or  rebound.     Comments: Abdomen is soft, some upper abdomen tenderness, she does not appear distended, no rebound/guarding. Surgical scars present.   Genitourinary:    Comments: Deferred Skin:    General: Skin is warm and dry.     Coloration: Skin is not jaundiced or pale.  Neurological:     General: No focal deficit present.     Mental Status: She is alert and oriented to person, place, and time.  Psychiatric:        Mood and Affect: Mood normal.        Behavior: Behavior normal.     Labs:  CBC Latest Ref Rng & Units 03/05/2021 03/04/2021 08/29/2020  WBC 4.0 - 10.5 K/uL 16.9(H) 19.5(H) 6.8  Hemoglobin 12.0 - 15.0 g/dL 56.2 16.2(H) 13.1  Hematocrit 36.0 - 46.0 % 38.1 45.4 38.9  Platelets 150 - 400 K/uL 290 377 312   CMP Latest Ref Rng & Units 03/05/2021 03/04/2021 08/29/2020  Glucose 70 - 99 mg/dL 96 130(Q) 82  BUN 6 - 20 mg/dL 8 9 9   Creatinine 0.44 - 1.00 mg/dL 6.57 8.46  Sodium 135 - 145 mmol/L 139 138 140  Potassium 3.5 - 5.1 mmol/L 3.2(L) 3.2(L) 4.3  Chloride 98 - 111 mmol/L 107 102 104  CO2 22 - 32 mmol/L 22 19(L) 26  Calcium 8.9 - 10.3 mg/dL 8.9 10.6(H) 9.9  Total Protein 6.5 - 8.1 g/dL 6.5 9.62) 6.6  Total Bilirubin 0.3 - 1.2 mg/dL 0.9 9.5(M) 0.4  Alkaline Phos 38  - 126 U/L 61 81 -  AST 15 - 41 U/L 17 29 20   ALT 0 - 44 U/L 12 15 18      Imaging studies:   CT Abdomen/Pelvis (03/04/2021) personally reviewed with dilated stomach and small bowel bowel concerning for obstruction, gastric band, and radiologist report reviewed:  IMPRESSION: 1. Dilated loops of small bowel throughout the abdomen measuring up to 3.5 cm with a transition to decompressed bowel in the midline. Findings are most consistent with small bowel obstruction likely due to adhesions. 2. Prior gastric band with reservoir in the right hemiabdomen. 3. Punctate nonobstructive bilateral renal stones.    Assessment/Plan: (ICD-10's: K44.609) 40 y.o. female with small bowel obstruction, most likely secondary to post-surgical adhesions   - Appreciate medicine admission  - Agree with NGT decompression; LIS; monitor and record output  - Continue NPO + IVF resuscitation  - Monitor abdominal examination +/- serial KUBs  - Monitor on-going bowel function  - Pain control prn (avoid/limit narcotics); antiemetics   - No emergent surgical intervention. She understands that should she fail to improve or clinically resolve, we may need to consider more urgent interventions.   - Mobilization encouraged   - Further management per primary service; we will follow   All of the above findings and recommendations were discussed with the patient and her family at bedside, and all of their questions were answered to their expressed satisfaction.  Thank you for the opportunity to participate in this patient's care.   -- 03/06/2021, PA-C [facility] 03/05/2021, 7:19 AM [phone] M-F: 7am - 4pm

## 2021-03-06 ENCOUNTER — Inpatient Hospital Stay: Payer: BC Managed Care – PPO

## 2021-03-06 DIAGNOSIS — K56609 Unspecified intestinal obstruction, unspecified as to partial versus complete obstruction: Secondary | ICD-10-CM | POA: Diagnosis not present

## 2021-03-06 DIAGNOSIS — R197 Diarrhea, unspecified: Secondary | ICD-10-CM | POA: Diagnosis not present

## 2021-03-06 DIAGNOSIS — R112 Nausea with vomiting, unspecified: Secondary | ICD-10-CM | POA: Diagnosis not present

## 2021-03-06 LAB — BASIC METABOLIC PANEL
Anion gap: 12 (ref 5–15)
BUN: <5 mg/dL — ABNORMAL LOW (ref 6–20)
CO2: 22 mmol/L (ref 22–32)
Calcium: 9 mg/dL (ref 8.9–10.3)
Chloride: 103 mmol/L (ref 98–111)
Creatinine, Ser: 0.59 mg/dL (ref 0.44–1.00)
GFR, Estimated: >60 mL/min (ref >60)
Glucose, Bld: 92 mg/dL (ref 70–99)
Potassium: 3 mmol/L — ABNORMAL LOW (ref 3.5–5.1)
Sodium: 137 mmol/L (ref 135–145)

## 2021-03-06 LAB — CBC
HCT: 35.1 % — ABNORMAL LOW (ref 36.0–46.0)
Hemoglobin: 12.8 g/dL (ref 12.0–15.0)
MCH: 33.7 pg (ref 26.0–34.0)
MCHC: 36.5 g/dL — ABNORMAL HIGH (ref 30.0–36.0)
MCV: 92.4 fL (ref 80.0–100.0)
Platelets: 272 10*3/uL (ref 150–400)
RBC: 3.8 MIL/uL — ABNORMAL LOW (ref 3.87–5.11)
RDW: 11.9 % (ref 11.5–15.5)
WBC: 13.2 10*3/uL — ABNORMAL HIGH (ref 4.0–10.5)
nRBC: 0 % (ref 0.0–0.2)

## 2021-03-06 LAB — MAGNESIUM: Magnesium: 1.8 mg/dL (ref 1.7–2.4)

## 2021-03-06 MED ORDER — POTASSIUM CHLORIDE 10 MEQ/100ML IV SOLN
10.0000 meq | INTRAVENOUS | Status: AC
Start: 2021-03-06 — End: 2021-03-07
  Administered 2021-03-06 – 2021-03-07 (×5): 10 meq via INTRAVENOUS
  Filled 2021-03-06 (×4): qty 100

## 2021-03-06 MED ORDER — KETOROLAC TROMETHAMINE 15 MG/ML IJ SOLN
15.0000 mg | Freq: Four times a day (QID) | INTRAMUSCULAR | Status: DC | PRN
  Administered 2021-03-06 (×3): 15 mg via INTRAVENOUS
  Filled 2021-03-06 (×2): qty 1

## 2021-03-06 MED ORDER — POTASSIUM CHLORIDE 10 MEQ/100ML IV SOLN
10.0000 meq | INTRAVENOUS | Status: AC
Start: 2021-03-06 — End: 2021-03-06
  Filled 2021-03-06 (×2): qty 100

## 2021-03-06 MED ORDER — SERTRALINE HCL 50 MG PO TABS
100.0000 mg | ORAL_TABLET | Freq: Every day | ORAL | Status: DC
  Administered 2021-03-07 – 2021-03-08 (×2): 100 mg via ORAL
  Filled 2021-03-06 (×2): qty 2

## 2021-03-06 MED ORDER — AMITRIPTYLINE HCL 100 MG PO TABS
100.0000 mg | ORAL_TABLET | Freq: Every day | ORAL | Status: DC
  Administered 2021-03-06 – 2021-03-07 (×2): 100 mg via ORAL
  Filled 2021-03-06 (×3): qty 1

## 2021-03-06 MED ORDER — PROCHLORPERAZINE EDISYLATE 10 MG/2ML IJ SOLN
10.0000 mg | INTRAMUSCULAR | Status: DC | PRN
  Administered 2021-03-06 – 2021-03-07 (×3): 10 mg via INTRAVENOUS
  Filled 2021-03-06 (×3): qty 2

## 2021-03-06 MED ORDER — MAGNESIUM SULFATE 2 GM/50ML IV SOLN
2.0000 g | Freq: Once | INTRAVENOUS | Status: AC
  Administered 2021-03-06: 2 g via INTRAVENOUS
  Filled 2021-03-06: qty 50

## 2021-03-06 NOTE — Progress Notes (Signed)
PROGRESS NOTE    Kristen [name]  AYT:[id number] DOB: 02-May-1981 DOA: 03/04/2021 PCP: [name], MD    Brief Narrative:  Kristen [name] is a 40 year old female with past medical history significant for anxiety/depression, nephrolithiasis, migraine headache status post gastric banding who presented to [facility] ED on 10/16 with progressive left-sided abdominal pain with associated nausea/vomiting and diarrhea.  Onset over the last 2-3 days.  Describes pain as severe, sharp along left abdomen.  Denies dark or bloody stool.  No dysuria.  In the ED, temperature 97.8 F, HR 103, RR 18, BP 108/84, SPO2 95% on room air.  Sodium 138, potassium 3.2, chloride 102, CO2 19, glucose 129, BUN 9, creatinine 0.70, lipase 33, AST 29, ALT 15, total bilirubin 1.4.  WBC 19.5, hemoglobin 16.2, platelets 377.  Urine pregnancy test negative.  COVID-19 PCR negative.  Influenza A/B PCR negative.  Urinalysis with trace leukocytes, negative nitrite, no bacteria, 11-20 WBCs.  CT abdomen/pelvis with contrast with dilated loops of small bowel throughout abdomen measuring up to 3.5 cm with transition point in the midline consistent with small bowel obstruction likely secondary to adhesions, prior gastric band with reservoir right hemiabdomen and punctate nonobstructive bilateral renal stones.  General surgery was consulted.  NG tube placed.  Hospital service consulted for further evaluation and management of acute small bowel obstruction.   Assessment & Plan:   Principal Problem:   Nausea vomiting and diarrhea Active Problems:   History of laparoscopic adjustable gastric banding 01/08/11, Revised 12/25/12 due to slip   Chronic anxiety   SBO (small bowel obstruction) (HCC)   Small bowel obstruction Patient presenting to the ED with 2-3-day history of sharp left-sided abdominal pain associate with nausea cefonicid diarrhea.  CT abdomen/pelvis notable for small bowel obstruction with transition point in the  midline likely secondary to adhesions in the setting of previous gastric band procedure. --General surgery following, appreciate assistance --NG tube currently clamped; monitor gastric output --N.p.o. except for ice chips --Benzocaine/Chloraseptic spray as needed for NG tube irritation --Serial abdominal exams --Supportive care, IV Zofran, pain control --Further per general surgery  Leukocytosis Etiology likely reactive in the setting of nausea/vomiting from small bowel obstruction as above.  Unlikely infectious etiology is afebrile. --WBC 19.5>16.9>13.2 --Continue IVF hydration with NS at 142mL/hr --NPO as above --CBC daily  Hypokalemia Hypomagnesemia Potassium 3.0, magnesium 1.8.  Etiology likely secondary to GI loss with nausea/vomiting/diarrhea and poor oral intake in the setting of small bowel obstruction. --Replete potassium and magnesium today --Repeat electrolytes in a.m.  Anxiety/depression: On sertraline 100 mg p.o. daily, Xanax 1 mg p.o. 3 times daily as needed. --Hold home medications while n.p.o. --Ativan 0.5 mg IV TID prn anxiety  GERD: Protonix 40 mg IV every 12 hours  Migraine headache On amitriptyline at home. --IV Toradol, morphine, Compazine as needed while NPO  DVT prophylaxis: heparin injection 5,000 Units Start: 03/04/21 2200 SCDs Start: 03/04/21 1929   Code Status: Full Code Family Communication: Updated patient's sister who is present at bedside this morning  Disposition Plan:  Level of care: Med-Surg Status is: Inpatient  Remains inpatient appropriate because: Continues with small bowel obstruction requiring NG tube for decompression, IV fluid hydration and electrolyte correction for hypokalemia.    Consultants:  General surgery  Procedures:  NG tube placement  Antimicrobials:  None   Subjective: Patient seen examined at bedside, resting comfortably.  Head under blanket.  Sister present.  Complaining of migraine headache.  Continues to  be irritated with NG  tube in place.  Seen by general surgery this morning, NG tube clamped.  Abdominal pain relatively resolved.  Denies flatus or bowel movement.  No other questions or concerns at this time.  Denies headache, no chest pain, no shortness of breath, no current nausea/vomiting/diarrhea, no cough/congestion, no weakness, no fatigue, no paresthesias.  No acute events overnight per nursing staff.  Objective: Vitals:   03/05/21 2011 03/06/21 0558 03/06/21 0733 03/06/21 0743  BP: 119/72 108/66 (!) 98/57 103/63  Pulse: 98 (!) 106 (!) 106 (!) 110  Resp: 18 16 20    Temp: 99.1 F (37.3 C) 99.1 F (37.3 C) (!) 97.4 F (36.3 C)   TempSrc: Oral Oral Oral   SpO2: 91% 100% 97% 98%  Weight:      Height:        Intake/Output Summary (Last 24 hours) at 03/06/2021 1021 Last data filed at 03/05/2021 1915 Gross per 24 hour  Intake --  Output 450 ml  Net -450 ml   Filed Weights   03/04/21 1531  Weight: 81.6 kg    Examination:  General exam: Appears calm and comfortable  Respiratory system: Clear to auscultation. Respiratory effort normal.  On room air Cardiovascular system: S1 & S2 heard, RRR. No JVD, murmurs, rubs, gallops or clicks. No pedal edema. Gastrointestinal system: Abdomen is nondistended, soft and nontender. No organomegaly or masses felt.  Faint/distant bowel sounds appreciated.  NG tube noted, currently clamped. Central nervous system: Alert and oriented. No focal neurological deficits. Extremities: Symmetric 5 x 5 power. Skin: No rashes, lesions or ulcers Psychiatry: Judgement and insight appear normal. Mood & affect appropriate.     Data Reviewed: I have personally reviewed following labs and imaging studies  CBC: Recent Labs  Lab 03/04/21 1534 03/05/21 0550 03/06/21 0734  WBC 19.5* 16.9* 13.2*  HGB 16.2* 13.6 12.8  HCT 45.4 38.1 35.1*  MCV 92.5 94.8 92.4  PLT 377 290 272   Basic Metabolic Panel: Recent Labs  Lab 03/04/21 1534 03/05/21 0550  03/06/21 0734  NA 138 139 137  K 3.2* 3.2* 3.0*  CL 102 107 103  CO2 19* 22 22  GLUCOSE 129* 96 92  BUN 9 8 <5*  CREATININE 0.78 0.69 0.59  CALCIUM 10.6* 8.9 9.0  MG  --  1.5* 1.8   GFR: Estimated Creatinine Clearance: 97.6 mL/min (by C-G formula based on SCr of 0.59 mg/dL). Liver Function Tests: Recent Labs  Lab 03/04/21 1534 03/05/21 0550  AST 29 17  ALT 15 12  ALKPHOS 81 61  BILITOT 1.4* 0.9  PROT 8.6* 6.5  ALBUMIN 5.1* 4.0   Recent Labs  Lab 03/04/21 1534  LIPASE 33   No results for input(s): AMMONIA in the last 168 hours. Coagulation Profile: No results for input(s): INR, PROTIME in the last 168 hours. Cardiac Enzymes: No results for input(s): CKTOTAL, CKMB, CKMBINDEX, TROPONINI in the last 168 hours. BNP (last 3 results) No results for input(s): PROBNP in the last 8760 hours. HbA1C: No results for input(s): HGBA1C in the last 72 hours. CBG: No results for input(s): GLUCAP in the last 168 hours. Lipid Profile: No results for input(s): CHOL, HDL, LDLCALC, TRIG, CHOLHDL, LDLDIRECT in the last 72 hours. Thyroid Function Tests: No results for input(s): TSH, T4TOTAL, FREET4, T3FREE, THYROIDAB in the last 72 hours. Anemia Panel: No results for input(s): VITAMINB12, FOLATE, FERRITIN, TIBC, IRON, RETICCTPCT in the last 72 hours. Sepsis Labs: No results for input(s): PROCALCITON, LATICACIDVEN in the last 168 hours.  Recent Results (from  the past 240 hour(s))  Resp Panel by RT-PCR (Flu A&B, Covid) Nasopharyngeal Swab     Status: None   Collection Time: 03/04/21  7:04 PM   Specimen: Nasopharyngeal Swab; Nasopharyngeal(NP) swabs in vial transport medium  Result Value Ref Range Status   SARS Coronavirus 2 by RT PCR NEGATIVE NEGATIVE Final    Comment: (NOTE) SARS-CoV-2 target nucleic acids are NOT DETECTED.  The SARS-CoV-2 RNA is generally detectable in upper respiratory specimens during the acute phase of infection. The lowest concentration of SARS-CoV-2 viral  copies this assay can detect is 138 copies/mL. A negative result does not preclude SARS-Cov-2 infection and should not be used as the sole basis for treatment or other patient management decisions. A negative result may occur with  improper specimen collection/handling, submission of specimen other than nasopharyngeal swab, presence of viral mutation(s) within the areas targeted by this assay, and inadequate number of viral copies(<138 copies/mL). A negative result must be combined with clinical observations, patient history, and epidemiological information. The expected result is Negative.  Fact Sheet for Patients:  BloggerCourse.com  Fact Sheet for Healthcare Providers:  SeriousBroker.it  This test is no t yet approved or cleared by the [place] FDA and  has been authorized for detection and/or diagnosis of SARS-CoV-2 by FDA under an Emergency Use Authorization (EUA). This EUA will remain  in effect (meaning this test can be used) for the duration of the COVID-19 declaration under Section 564(b)(1) of the Act, 21 U.S.C.section 360bbb-3(b)(1), unless the authorization is terminated  or revoked sooner.       Influenza A by PCR NEGATIVE NEGATIVE Final   Influenza B by PCR NEGATIVE NEGATIVE Final    Comment: (NOTE) The Xpert Xpress SARS-CoV-2/FLU/RSV plus assay is intended as an aid in the diagnosis of influenza from Nasopharyngeal swab specimens and should not be used as a sole basis for treatment. Nasal washings and aspirates are unacceptable for Xpert Xpress SARS-CoV-2/FLU/RSV testing.  Fact Sheet for Patients: BloggerCourse.com  Fact Sheet for Healthcare Providers: SeriousBroker.it  This test is not yet approved or cleared by the [place] FDA and has been authorized for detection and/or diagnosis of SARS-CoV-2 by FDA under an Emergency Use Authorization (EUA). This  EUA will remain in effect (meaning this test can be used) for the duration of the COVID-19 declaration under Section 564(b)(1) of the Act, 21 U.S.C. section 360bbb-3(b)(1), unless the authorization is terminated or revoked.  Performed at [facility], [address] [place], [place] [address]          Radiology Studies: DG Abdomen 1 View  Result Date: 03/04/2021 CLINICAL DATA:  NG tube placement EXAM: ABDOMEN - 1 VIEW COMPARISON:  12/12/2020 FINDINGS: NG tube tip is in the distal stomach. Lap band device is in place. Gaseous distention of the stomach and bowel diffusely. IMPRESSION: NG tube tip in the distal stomach. Diffuse gaseous distention of bowel including stomach. Electronically Signed   By: Charlett Nose M.D.   On: 03/04/2021 19:29   CT ABDOMEN PELVIS W CONTRAST  Result Date: 03/04/2021 CLINICAL DATA:  Nausea vomiting diarrhea and left upper quadrant pain since Friday. EXAM: CT ABDOMEN AND PELVIS WITH CONTRAST TECHNIQUE: Multidetector CT imaging of the abdomen and pelvis was performed using the standard protocol following bolus administration of intravenous contrast. CONTRAST:  OMNIPAQUE IOHEXOL 300 MG/ML  SOLN COMPARISON:  CT December 12, 2020 FINDINGS: Lower chest: No acute abnormality. Hepatobiliary: No suspicious hepatic lesion. Gallbladder is unremarkable. No biliary ductal dilation. Pancreas:  No pancreatic ductal dilation or evidence of acute inflammation. Spleen: Within normal limits. Adrenals/Urinary Tract: Adrenal glands are unremarkable. No hydronephrosis. Punctate nonobstructive bilateral renal stones. Subcentimeter hypodense bilateral renal lesions which are technically too small to accurately characterize but statistically likely represent cysts. No solid enhancing renal mass. Bladder is unremarkable for degree of distension. Stomach/Bowel: Prior gastric band with reservoir in the right hemiabdomen. Stomach is distended with gas and enteric contents. Dilated  loops of small bowel throughout the abdomen measuring up to 3.5 cm with a transition to decompressed bowel in the midline pelvis on image 75/2. Gas fluid levels throughout the colon. Vascular/Lymphatic: No significant vascular findings are present. No enlarged abdominal or pelvic lymph nodes. Reproductive: Intrauterine device appears appropriate in positioning. No suspicious adnexal lesion. Other: No abdominopelvic ascites. Musculoskeletal: No acute or significant osseous findings. IMPRESSION: 1. Dilated loops of small bowel throughout the abdomen measuring up to 3.5 cm with a transition to decompressed bowel in the midline. Findings are most consistent with small bowel obstruction likely due to adhesions. 2. Prior gastric band with reservoir in the right hemiabdomen. 3. Punctate nonobstructive bilateral renal stones. Electronically Signed   By: Maudry Mayhew M.D.   On: 03/04/2021 18:37   DG Abd 2 Views  Result Date: 03/06/2021 CLINICAL DATA:  Small-bowel obstruction, abdominal pain EXAM: ABDOMEN - 2 VIEW COMPARISON:  KUB 03/04/2021 FINDINGS: Enteric catheter tip and side hole projects over the stomach. A lap band is in place in appropriate position. There is a nonobstructive bowel gas pattern. The lung bases are clear. The bones are unremarkable. An IUD is seen projecting over the pelvis. IMPRESSION: 1. Nonobstructive bowel gas pattern. 2. Lap band in appropriate position. Electronically Signed   By: Lesia Hausen M.D.   On: 03/06/2021 09:08        Scheduled Meds:  heparin  5,000 Units Subcutaneous Q8H   pantoprazole (PROTONIX) IV  40 mg Intravenous Q12H   Continuous Infusions:  sodium chloride 100 mL/hr at 03/06/21 0553   magnesium sulfate bolus IVPB 2 g (03/06/21 0949)   potassium chloride     promethazine (PHENERGAN) injection (IM or IVPB) 12.5 mg (03/05/21 1043)     LOS: 2 days    Time spent: 39 minutes spent on chart review, discussion with nursing staff, consultants, updating family  and interview/physical exam; more than 50% of that time was spent in counseling and/or coordination of care.    Alvira Philips [place], DO [facility] Available via Epic secure chat 7am-7pm After these hours, please refer to coverage provider listed on [email or web address] 03/06/2021, 10:21 AM

## 2021-03-06 NOTE — Progress Notes (Signed)
[facility] SURGICAL PROGRESS NOTE (cpt [phone])  Hospital Day(s): 2.   Interval History: Patient seen and examined, no acute events or new complaints overnight. Patient not feeling well this morning. Complaining of headache, ear ache, sore throats, heartburn, and did have a small episode of emesis. She is no longer having abdominal pain and her distension remains resolved. I do think that most of her symptoms are secondary to her NGT. Labs this morning are pending. KUB appears improved this morning. NGT output recorded at 450 ccs overnight; I did flush the tube this morning and only got 25 ccs or less of gastric fluid. She is passing flatus.   Review of Systems:  Constitutional: denies fever, chills  HEENT: + HA, + Ear Ache, + Sore Throat Respiratory: denies any shortness of breath  Cardiovascular: denies chest pain or palpitations  Gastrointestinal: denies abdominal pain, N/V, or diarrhea/and bowel function as per interval history Genitourinary: denies burning with urination or urinary frequency   Vital signs in last 24 hours: [min-max] current  Temp:  [98.2 F (36.8 C)-99.1 F (37.3 C)] 99.1 F (37.3 C) (10/18 0558) Pulse Rate:  [98-106] 106 (10/18 0558) Resp:  [16-18] 16 (10/18 0558) BP: (108-119)/(66-77) 108/66 (10/18 0558) SpO2:  [91 %-100 %] 100 % (10/18 0558)     Height: 5\' 5"  (165.1 cm) Weight: 81.6 kg BMI (Calculated): 29.95   Intake/Output last 2 shifts:  10/17 0701 - 10/18 0700 In: -  Out: 450 [Emesis/NG output:450]   Physical Exam:  Constitutional: alert, cooperative and no distress  HENT: normocephalic without obvious abnormality; NGT in place Eyes: PERRL, EOM's grossly intact and symmetric  Respiratory: breathing non-labored at rest  Cardiovascular: regular rate and sinus rhythm  Gastrointestinal: Soft, non-tender, and non-distended, no rebound/guarding Musculoskeletal: no edema or wounds, motor and sensation grossly intact, NT    Labs:  CBC  Latest Ref Rng & Units 03/05/2021 03/04/2021 08/29/2020  WBC 4.0 - 10.5 K/uL 16.9(H) 19.5(H) 6.8  Hemoglobin 12.0 - 15.0 g/dL 10/29/2020 16.2(H) 13.1  Hematocrit 36.0 - 46.0 % 38.1 45.4 38.9  Platelets 150 - 400 K/uL 290 377 312   CMP Latest Ref Rng & Units 03/05/2021 03/04/2021 08/29/2020  Glucose 70 - 99 mg/dL 96 10/29/2020) 82  BUN 6 - 20 mg/dL 8 9 9   Creatinine 0.44 - 1.00 mg/dL 301(S 0.10  Sodium 135 - 145 mmol/L 139 138 140  Potassium 3.5 - 5.1 mmol/L 3.2(L) 3.2(L) 4.3  Chloride 98 - 111 mmol/L 107 102 104  CO2 22 - 32 mmol/L 22 19(L) 26  Calcium 8.9 - 10.3 mg/dL 8.9 10.6(H) 9.9  Total Protein 6.5 - 8.1 g/dL 6.5 9.32) 6.6  Total Bilirubin 0.3 - 1.2 mg/dL 0.9 3.55) 0.4  Alkaline Phos 38 - 126 U/L 61 81 -  AST 15 - 41 U/L 17 29 20   ALT 0 - 44 U/L 12 15 18     Imaging studies:   KUB (03/06/2021) personally reviewed which shows NGT in position, no longer with gastric distension, no small bowel distension, and air throughput the colon, and radiologist report pending   Assessment/Plan: (ICD-10's: K69.609) 40 y.o. female with clinically and radiographically resolving small bowel obstruction, most likely secondary to post-surgical adhesions   - I do think that all of her complaints this morning (ear ache, headache, sore throat, heartburn, and small episode of emesis) are secondary to irritation from the NGT> She has remained without pain, distension resolved, bowel function returned, and NGT output in tubing appears more like  gastric fluid. Her KUB has also shown significant improvement. As such, I will proceed with clamping trial of her NGT in hopes she will pass this and we can remove later in the day to alleviate her discomfort. Clamp NGT x4 hours and check residuals. If less than 150 ccs, we can remove this.    - Continue NPO + IVF resuscitation             - Monitor abdominal examination +/- serial KUBs             - Monitor on-going bowel function             - Pain control prn  (avoid/limit narcotics); antiemetics              - No emergent surgical intervention. She understands that should she fail to improve or clinically resolve, we may need to consider more urgent interventions.              - Mobilization encouraged              - Further management per primary service; we will follow    All of the above findings and recommendations were discussed with the patient and her family at bedside, and all of their questions were answered to their expressed satisfaction.   -- Lynden Oxford, PA-C [facility] 03/06/2021, 7:23 AM [phone] M-F: 7am - 4pm

## 2021-03-06 NOTE — Progress Notes (Signed)
NG Tube removed, pt tolerated well, some bleeding from pt's right nostril noted, PA notified.

## 2021-03-06 NOTE — Progress Notes (Signed)
Mobility Specialist - Progress Note   03/06/21 1000  Mobility  Activity Ambulated in hall;Transferred to/from [facility]  Level of Assistance Standby assist, set-up cues, supervision of patient - no hands on  Assistive Device Other (Comment) (IV Pole)  Distance Ambulated (ft) 190 ft  Mobility Ambulated with assistance in hallway;Out of bed for toileting  Mobility Response Tolerated well  Mobility performed by Mobility specialist  $Mobility charge 1 Mobility    Post-mobility: 111 HR, 95% SpO2   Pt sleeping on arrival, sister present at bedside. Pt awakened by voice and agreed to ambulate hallway. Denies nausea and pain, but mild lightheadedness upon sitting. Denied SOB on RA. Pt left on BSC upon return to room.    Filiberto Pinks Mobility Specialist 03/06/21, 10:54 AM

## 2021-03-07 ENCOUNTER — Inpatient Hospital Stay: Payer: BC Managed Care – PPO

## 2021-03-07 DIAGNOSIS — R112 Nausea with vomiting, unspecified: Secondary | ICD-10-CM | POA: Diagnosis not present

## 2021-03-07 DIAGNOSIS — F419 Anxiety disorder, unspecified: Secondary | ICD-10-CM | POA: Diagnosis not present

## 2021-03-07 DIAGNOSIS — Z9884 Bariatric surgery status: Secondary | ICD-10-CM | POA: Diagnosis not present

## 2021-03-07 DIAGNOSIS — K56609 Unspecified intestinal obstruction, unspecified as to partial versus complete obstruction: Secondary | ICD-10-CM | POA: Diagnosis not present

## 2021-03-07 DIAGNOSIS — D649 Anemia, unspecified: Secondary | ICD-10-CM

## 2021-03-07 LAB — FERRITIN: Ferritin: 68 ng/mL (ref 11–307)

## 2021-03-07 LAB — IRON AND TIBC
Iron: 30 ug/dL (ref 28–170)
Saturation Ratios: 13 % (ref 10.4–31.8)
TIBC: 231 ug/dL — ABNORMAL LOW (ref 250–450)
UIBC: 201 ug/dL

## 2021-03-07 LAB — MAGNESIUM: Magnesium: 2 mg/dL (ref 1.7–2.4)

## 2021-03-07 LAB — CBC
HCT: 33.8 % — ABNORMAL LOW (ref 36.0–46.0)
Hemoglobin: 11.9 g/dL — ABNORMAL LOW (ref 12.0–15.0)
MCH: 32.6 pg (ref 26.0–34.0)
MCHC: 35.2 g/dL (ref 30.0–36.0)
MCV: 92.6 fL (ref 80.0–100.0)
Platelets: 244 10*3/uL (ref 150–400)
RBC: 3.65 MIL/uL — ABNORMAL LOW (ref 3.87–5.11)
RDW: 11.8 % (ref 11.5–15.5)
WBC: 11 10*3/uL — ABNORMAL HIGH (ref 4.0–10.5)
nRBC: 0 % (ref 0.0–0.2)

## 2021-03-07 LAB — BASIC METABOLIC PANEL
Anion gap: 7 (ref 5–15)
BUN: 6 mg/dL (ref 6–20)
CO2: 22 mmol/L (ref 22–32)
Calcium: 8.7 mg/dL — ABNORMAL LOW (ref 8.9–10.3)
Chloride: 106 mmol/L (ref 98–111)
Creatinine, Ser: 0.51 mg/dL (ref 0.44–1.00)
GFR, Estimated: >60 mL/min (ref >60)
Glucose, Bld: 83 mg/dL (ref 70–99)
Potassium: 3.7 mmol/L (ref 3.5–5.1)
Sodium: 135 mmol/L (ref 135–145)

## 2021-03-07 LAB — RETICULOCYTES
Immature Retic Fract: 13.7 % (ref 2.3–15.9)
RBC.: 3.56 MIL/uL — ABNORMAL LOW (ref 3.87–5.11)
Retic Count, Absolute: 55.9 10*3/uL (ref 19.0–186.0)
Retic Ct Pct: 1.6 % (ref 0.4–3.1)

## 2021-03-07 LAB — FOLATE: Folate: 7.1 ng/mL (ref >5.9)

## 2021-03-07 LAB — VITAMIN B12: Vitamin B-12: 567 pg/mL (ref 180–914)

## 2021-03-07 MED ORDER — KETOROLAC TROMETHAMINE 30 MG/ML IJ SOLN
30.0000 mg | Freq: Four times a day (QID) | INTRAMUSCULAR | Status: DC | PRN
  Administered 2021-03-07 – 2021-03-08 (×3): 30 mg via INTRAVENOUS
  Filled 2021-03-07 (×3): qty 1

## 2021-03-07 MED ORDER — POTASSIUM CHLORIDE 10 MEQ/100ML IV SOLN
10.0000 meq | Freq: Once | INTRAVENOUS | Status: AC
  Administered 2021-03-07: 10 meq via INTRAVENOUS
  Filled 2021-03-07: qty 100

## 2021-03-07 MED ORDER — POTASSIUM CHLORIDE CRYS ER 20 MEQ PO TBCR
40.0000 meq | EXTENDED_RELEASE_TABLET | Freq: Once | ORAL | Status: AC
  Administered 2021-03-07: 40 meq via ORAL
  Filled 2021-03-07: qty 2

## 2021-03-07 NOTE — Progress Notes (Addendum)
ADDENDUM 11:16 AM - Patient unfortunately continues to have nausea and emesis. I wonder if this may also be secondary to her unresolved chronic migraine? I will back down diet and get KUB to reassess abdomen.    [facility] SURGICAL PROGRESS NOTE (cpt [phone])  Hospital Day(s): 3.   Interval History: Patient seen and examined, no acute events or new complaints overnight. Patient she is feeling better from an abdominal standpoint. 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 Leukocytosis continues to resolve; down to 11.0K. Renal function remains normal; sCr - 0.51; UO - unmeasured. No significant electrolyte derangements. NGT removed yesterday after passing clamping trial. She is tolerating CLD; tolerating well. She is passing flatus.   Review of Systems:  Constitutional: denies fever, chills  HEENT: + headache Respiratory: denies any shortness of breath  Cardiovascular: denies chest pain or palpitations  Gastrointestinal: denies abdominal pain, N/V, or diarrhea/and bowel function as per interval history Genitourinary: denies burning with urination or urinary frequency   Vital signs in last 24 hours: [min-max] current  Temp:  [98.1 F (36.7 C)-100 F (37.8 C)] 98.4 F (36.9 C) (10/19 0609) Pulse Rate:  [88-111] 88 (10/19 0609) Resp:  [16-20] 20 (10/19 0609) BP: (88-91)/(52-60) 91/60 (10/18 2027) SpO2:  [98 %-100 %] 100 % (10/19 0609)     Height: 5\' 5"  (165.1 cm) Weight: 81.6 kg BMI (Calculated): 29.95   Intake/Output last 2 shifts:  10/18 0701 - 10/19 0700 In: 34.2 [IV Piggyback:34.2] Out: 175 [Emesis/NG output:175]   Physical Exam:  Constitutional: alert, cooperative and no distress  HENT: normocephalic without obvious abnormality Eyes: PERRL, EOM's grossly intact and symmetric  Respiratory: breathing non-labored at rest  Cardiovascular: regular rate and sinus rhythm   Gastrointestinal: Soft, non-tender, and non-distended, no rebound/guarding Musculoskeletal: no edema or wounds, motor and sensation grossly intact, NT    Labs:  CBC Latest Ref Rng & Units 03/07/2021 03/06/2021 03/05/2021  WBC 4.0 - 10.5 K/uL 11.0(H) 13.2(H) 16.9(H)  Hemoglobin 12.0 - 15.0 g/dL 11.9(L) 12.8 13.6  Hematocrit 36.0 - 46.0 % 33.8(L) 35.1(L) 38.1  Platelets 150 - 400 K/uL 244 272 290   CMP Latest Ref Rng & Units 03/07/2021 03/06/2021 03/05/2021  Glucose 70 - 99 mg/dL 83 92 96  BUN 6 - 20 mg/dL 6 03/07/2021) 8  Creatinine <1(B - 1.00 mg/dL 1.47 8.29 5.62  Sodium 135 - 145 mmol/L 135 137 139  Potassium 3.5 - 5.1 mmol/L 3.7 3.0(L) 3.2(L)  Chloride 98 - 111 mmol/L 106 103 107  CO2 22 - 32 mmol/L 22 22 22   Calcium 8.9 - 10.3 mg/dL 1.30) 9.0 8.9  Total Protein 6.5 - 8.1 g/dL - - 6.5  Total Bilirubin 0.3 - 1.2 mg/dL - - 0.9  Alkaline Phos 38 - 126 U/L - - 61  AST 15 - 41 U/L - - 17  ALT 0 - 44 U/L - - 12    Imaging studies: No new pertinent imaging studies today    Assessment/Plan: (ICD-10's: K66.609) 40 y.o. female with clinically and radiographically resolving small bowel obstruction, most likely secondary to post-surgical adhesions   - Will advance to soft diet             - Monitor abdominal examination +/- serial KUBs             - Monitor on-going bowel function             -  Pain control prn (avoid/limit narcotics); antiemetics              - No emergent surgical intervention. She understands that should she fail to improve or clinically resolve, we may need to consider more urgent interventions.              - Mobilization encouraged              - Further management per primary service   - Discharge Planning; Okay for discharge from surgical perspective if she tolerates diet advancement today, she does NOT need surgical follow up. I will provide our contact information if needed    All of the above findings and recommendations were discussed with the patient and  her family at bedside, and all of their questions were answered to their expressed satisfaction.  -- Lynden Oxford, PA-C [facility] 03/07/2021, 7:47 AM [phone] M-F: 7am - 4pm

## 2021-03-07 NOTE — Progress Notes (Signed)
PROGRESS NOTE    Kristen [name]  YQM:[id number] DOB: 1980/10/15 DOA: 03/04/2021 PCP: [name], MD   Brief Narrative:  The patient is a 40 year old overweight Caucasian female with past medical history significant for but not limited to anxiety and depression, nephrolithiasis, migraine headaches, as well as other comorbidities who is status post gastric banding and presented to [facility] ED on 03/04/2021 with progressive left-sided abdominal pain associated nausea, vomiting and diarrhea.  She had this onset over the last 2 to 3 days and described the pain as very sharp and severe in the left side of abdomen but not any dark or bloody stools.  In the ED she is found to have an elevated heart rate now soft blood pressure.  Urinalysis showed trace leukocytes, negative nitrites and no bacteria.  CT of the abdomen pelvis was done and showed dilated loops of small bowel throughout the abdomen measuring up to 3.5 cm with a transition point in the midline consistent with small bowel obstruction secondary to adhesions,, prior gastric band with reservoir in the right hemiabdomen and punctate noninfected bilateral renal stones.  General surgery was consulted and she had an NG tube placed and hospitalist admitted this patient for the management of acute small bowel obstruction.  He was improving and had her NG tube removed and diet was advanced to a soft diet however she still was very limited so her diet was back down by general surgery and a KUB was ordered to get  Assessment & Plan:   Principal Problem:   Nausea vomiting and diarrhea Active Problems:   History of laparoscopic adjustable gastric banding 01/08/11, Revised 12/25/12 due to slip   Chronic anxiety   SBO (small bowel obstruction) (HCC)  Small bowel obstruction, in the setting of adhesions from prior gastric band surgery -She presented the ED due to a 3-day history of sharp left-sided abdominal pain that was associated with nausea, vomiting,  diarrhea -CT scan of the abdomen pelvis is notable for small bowel obstruction with a transition point in the midline likely secondary ideation in the setting of her previous gastric band procedure -General surgery was consulted and appreciate formal evaluation Assistant -She had her NG tube clamped yesterday and this was removed -She was n.p.o. except for ice chips but diet was slowly advanced and she was placed on a clear liquid diet and then advance to a soft diet however she is not able to tolerate a soft diet as she had nausea vomiting -She is getting another KUB this morning after she is nauseous again -Continue with supportive care and antiemetics with IV Zofran as well as pain control -Ensure magnesium is above 2 and potassium above 4   -Continue supportive care and mobilization -General surgery felt that from a surgical perspective she tolerated her diet advancement that she could be safely discharged however will hold discharge today given her continued nausea vomiting and will follow up on further general surgery recommendations -Per general surgery she does not need surgical follow-up at discharge  Hypokalemia/Hypomagnesemia -Potassium level is now 3.7 and an magnesium level was 2.0 -Etiology is likely secondary to GI losses with nausea vomiting and diarrhea as well as poor p.o. intake in the setting of SBR -Continue to monitor and replete as necessary -We will replete with p.o. potassium chloride 40 mg x 1 -Repeat CMP and magnesium level in the a.m.  Leukocytosis -Mild and likely reactive in the setting of her small bowel obstruction -WBC went from 19.5 -> 16.9 ->  13.2 -> 11.0 -She was continued on IV fluid hydration with normal saline 100 MLS per hour which has now stopped  -Continue monitor and trend and repeat CBC in a.m.  Depression and Anxiety -Continue with sertraline 100 mg p.o. daily; she takes alprazolam 1 mg p.o. 3 times daily as needed at home but currently on  lorazepam 0.5 mg IV every 4 as needed for anxiety which will need to be changed back to p.o.  Migraine Headaches -Continue amitriptyline 100 mg p.o. nightly and she also has ketorolac 30 mg IV every 6 as needed from mild to moderate pain  GERD -Continue with Pantoprazole IV 40 mg every 12 for now and change back to p.o. when she is able to tolerate her diet  Normocytic Anemia -Likely reactive in the setting of dilutional drop -Patient's hemoglobin/marker went from 16.2/45.4 -> 13.6/38.1 -> 12.8/35.1 -> 11.9/33.8 -Check anemia panel in a.m. -Continue monitor for signs and symptoms bleeding; currently no overt bleeding noted -Repeat CBC in a.m.  DVT prophylaxis: Heparin 5,000 units sq q8h Code Status:  FULL CODE Family Communication: Discussed with family at bedside  Disposition Plan: Pending further clinical improvement and clearance by General surgery  Status is: Inpatient  Remains inpatient appropriate because: She has to tolerate her diet and ensured that she is able to do this without nausea vomiting and she also needs general surgery clearance prior to safe discharge disposition  Consultants:  General Surgery   Procedures: None  Antimicrobials:  Anti-infectives (From admission, onward)    None        Subjective: Seen and examined at bedside and she is feeling better however she vomited prior to me walking into the room.  General surgery has now cut back her diet and is going to be ordering a KUB.  She denies chest pain or shortness of breath.  Feels okay.  Has been having headaches on and off.  No other concerns or complaints this time.  Objective: Vitals:   03/06/21 2030 03/06/21 2034 03/07/21 0609 03/07/21 0846  BP:    95/65  Pulse: (!) 111 (!) 108 88 97  Resp:   20 16  Temp:   98.4 F (36.9 C) 98 F (36.7 C)  TempSrc:   Oral Oral  SpO2: 99% 99% 100% 100%  Weight:      Height:        Intake/Output Summary (Last 24 hours) at 03/07/2021 1236 Last data filed  at 03/07/2021 1025 Gross per 24 hour  Intake 514.18 ml  Output 0 ml  Net 514.18 ml   Filed Weights   03/04/21 1531  Weight: 81.6 kg   Examination: Physical Exam:  Constitutional: WN/WD overweight Caucasian female in NAD and appears calm and comfortable Eyes: Lids and conjunctivae normal, sclerae anicteric  ENMT: External Ears, Nose appear normal. Grossly normal hearing. Mucous membranes are moist.  Neck: Appears normal, supple, no cervical masses, normal ROM, no appreciable thyromegaly; no JVD Respiratory: Diminished to auscultation bilaterally, no wheezing, rales, rhonchi or crackles. Normal respiratory effort and patient is not tachypenic. No accessory muscle use.  Unlabored breathing Cardiovascular: RRR, no murmurs / rubs / gallops. S1 and S2 auscultated. No extremity edema.  Abdomen: Soft, non-tender, distended secondary to body habitus. Bowel sounds positive.  GU: Deferred. Musculoskeletal: No clubbing / cyanosis of digits/nails. No joint deformity upper and lower extremities. Skin: No rashes, lesions, ulcers on limited skin evaluation. No induration; Warm and dry.  Neurologic: CN 2-12 grossly intact with no focal deficits. Romberg  sign and cerebellar reflexes not assessed.  Psychiatric: Normal judgment and insight. Alert and oriented x 3. Normal mood and appropriate affect.   Data Reviewed: I have personally reviewed following labs and imaging studies  CBC: Recent Labs  Lab 03/04/21 1534 03/05/21 0550 03/06/21 0734 03/07/21 0249  WBC 19.5* 16.9* 13.2* 11.0*  HGB 16.2* 13.6 12.8 11.9*  HCT 45.4 38.1 35.1* 33.8*  MCV 92.5 94.8 92.4 92.6  PLT 377 290 272 244   Basic Metabolic Panel: Recent Labs  Lab 03/04/21 1534 03/05/21 0550 03/06/21 0734 03/07/21 0249  NA 138 139 137 135  K 3.2* 3.2* 3.0* 3.7  CL 102 107 103 106  CO2 19* 22 22 22   GLUCOSE 129* 96 92 83  BUN 9 8 <5* 6  CREATININE 0.78 0.69 0.59 0.51  CALCIUM 10.6* 8.9 9.0 8.7*  MG  --  1.5* 1.8 2.0    GFR: Estimated Creatinine Clearance: 97.6 mL/min (by C-G formula based on SCr of 0.51 mg/dL). Liver Function Tests: Recent Labs  Lab 03/04/21 1534 03/05/21 0550  AST 29 17  ALT 15 12  ALKPHOS 81 61  BILITOT 1.4* 0.9  PROT 8.6* 6.5  ALBUMIN 5.1* 4.0   Recent Labs  Lab 03/04/21 1534  LIPASE 33   No results for input(s): AMMONIA in the last 168 hours. Coagulation Profile: No results for input(s): INR, PROTIME in the last 168 hours. Cardiac Enzymes: No results for input(s): CKTOTAL, CKMB, CKMBINDEX, TROPONINI in the last 168 hours. BNP (last 3 results) No results for input(s): PROBNP in the last 8760 hours. HbA1C: No results for input(s): HGBA1C in the last 72 hours. CBG: No results for input(s): GLUCAP in the last 168 hours. Lipid Profile: No results for input(s): CHOL, HDL, LDLCALC, TRIG, CHOLHDL, LDLDIRECT in the last 72 hours. Thyroid Function Tests: No results for input(s): TSH, T4TOTAL, FREET4, T3FREE, THYROIDAB in the last 72 hours. Anemia Panel: No results for input(s): VITAMINB12, FOLATE, FERRITIN, TIBC, IRON, RETICCTPCT in the last 72 hours. Sepsis Labs: No results for input(s): PROCALCITON, LATICACIDVEN in the last 168 hours.  Recent Results (from the past 240 hour(s))  Resp Panel by RT-PCR (Flu A&B, Covid) Nasopharyngeal Swab     Status: None   Collection Time: 03/04/21  7:04 PM   Specimen: Nasopharyngeal Swab; Nasopharyngeal(NP) swabs in vial transport medium  Result Value Ref Range Status   SARS Coronavirus 2 by RT PCR NEGATIVE NEGATIVE Final    Comment: (NOTE) SARS-CoV-2 target nucleic acids are NOT DETECTED.  The SARS-CoV-2 RNA is generally detectable in upper respiratory specimens during the acute phase of infection. The lowest concentration of SARS-CoV-2 viral copies this assay can detect is 138 copies/mL. A negative result does not preclude SARS-Cov-2 infection and should not be used as the sole basis for treatment or other patient management  decisions. A negative result may occur with  improper specimen collection/handling, submission of specimen other than nasopharyngeal swab, presence of viral mutation(s) within the areas targeted by this assay, and inadequate number of viral copies(<138 copies/mL). A negative result must be combined with clinical observations, patient history, and epidemiological information. The expected result is Negative.  Fact Sheet for Patients:  03/06/21  Fact Sheet for Healthcare Providers:  BloggerCourse.com  This test is no t yet approved or cleared by the [place] FDA and  has been authorized for detection and/or diagnosis of SARS-CoV-2 by FDA under an Emergency Use Authorization (EUA). This EUA will remain  in effect (meaning this test can be used)  for the duration of the COVID-19 declaration under Section 564(b)(1) of the Act, 21 U.S.C.section 360bbb-3(b)(1), unless the authorization is terminated  or revoked sooner.       Influenza A by PCR NEGATIVE NEGATIVE Final   Influenza B by PCR NEGATIVE NEGATIVE Final    Comment: (NOTE) The Xpert Xpress SARS-CoV-2/FLU/RSV plus assay is intended as an aid in the diagnosis of influenza from Nasopharyngeal swab specimens and should not be used as a sole basis for treatment. Nasal washings and aspirates are unacceptable for Xpert Xpress SARS-CoV-2/FLU/RSV testing.  Fact Sheet for Patients: BloggerCourse.com  Fact Sheet for Healthcare Providers: [employer]ious[employer]ker[employer]it  This test is not yet approved or cleared by the [place] FDA and has been authorized for detection and/or diagnosis of SARS-CoV-2 by FDA under an Emergency Use Authorization (EUA). This EUA will remain in effect (meaning this test can be used) for the duration of the COVID-19 declaration under Section 564(b)(1) of the Act, 21 U.S.C. section 360bbb-3(b)(1), unless the  authorization is terminated or revoked.  Performed at [facility], [address] [place], [place] [address]     RN Pressure Injury Documentation:     Estimated body mass index is 29.95 kg/m as calculated from the following:   Height as of this encounter: 5\' 5"  (1.651 m).   Weight as of this encounter: 81.6 kg.  Malnutrition Type:  Malnutrition Characteristics:   Nutrition Interventions:    Radiology Studies: DG Abd 2 Views  Result Date: 03/06/2021 CLINICAL DATA:  Small-bowel obstruction, abdominal pain EXAM: ABDOMEN - 2 VIEW COMPARISON:  KUB 03/04/2021 FINDINGS: Enteric catheter tip and side hole projects over the stomach. A lap band is in place in appropriate position. There is a nonobstructive bowel gas pattern. The lung bases are clear. The bones are unremarkable. An IUD is seen projecting over the pelvis. IMPRESSION: 1. Nonobstructive bowel gas pattern. 2. Lap band in appropriate position. Electronically Signed   By: 03/06/2021 M.D.   On: 03/06/2021 09:08    Scheduled Meds:  amitriptyline  100 mg Oral QHS   heparin  5,000 Units Subcutaneous Q8H   pantoprazole (PROTONIX) IV  40 mg Intravenous Q12H   sertraline  100 mg Oral Daily   Continuous Infusions:  promethazine (PHENERGAN) injection (IM or IVPB) 12.5 mg (03/05/21 1043)    LOS: 3 days   03/07/21, DO [facility] PAGER is on AMION  If 7PM-7AM, please contact night-coverage [email or web address]

## 2021-03-08 DIAGNOSIS — F419 Anxiety disorder, unspecified: Secondary | ICD-10-CM | POA: Diagnosis not present

## 2021-03-08 DIAGNOSIS — Z9884 Bariatric surgery status: Secondary | ICD-10-CM | POA: Diagnosis not present

## 2021-03-08 DIAGNOSIS — K56609 Unspecified intestinal obstruction, unspecified as to partial versus complete obstruction: Secondary | ICD-10-CM | POA: Diagnosis not present

## 2021-03-08 DIAGNOSIS — R112 Nausea with vomiting, unspecified: Secondary | ICD-10-CM | POA: Diagnosis not present

## 2021-03-08 LAB — COMPREHENSIVE METABOLIC PANEL
ALT: 11 U/L (ref 0–44)
AST: 12 U/L — ABNORMAL LOW (ref 15–41)
Albumin: 3.3 g/dL — ABNORMAL LOW (ref 3.5–5.0)
Alkaline Phosphatase: 52 U/L (ref 38–126)
Anion gap: 6 (ref 5–15)
BUN: 8 mg/dL (ref 6–20)
CO2: 24 mmol/L (ref 22–32)
Calcium: 9 mg/dL (ref 8.9–10.3)
Chloride: 107 mmol/L (ref 98–111)
Creatinine, Ser: 0.46 mg/dL (ref 0.44–1.00)
GFR, Estimated: >60 mL/min (ref >60)
Glucose, Bld: 85 mg/dL (ref 70–99)
Potassium: 3.5 mmol/L (ref 3.5–5.1)
Sodium: 137 mmol/L (ref 135–145)
Total Bilirubin: 0.9 mg/dL (ref 0.3–1.2)
Total Protein: 6.1 g/dL — ABNORMAL LOW (ref 6.5–8.1)

## 2021-03-08 LAB — CBC WITH DIFFERENTIAL/PLATELET
Abs Immature Granulocytes: 0.05 10*3/uL (ref 0.00–0.07)
Basophils Absolute: 0 10*3/uL (ref 0.0–0.1)
Basophils Relative: 1 %
Eosinophils Absolute: 0.7 10*3/uL — ABNORMAL HIGH (ref 0.0–0.5)
Eosinophils Relative: 10 %
HCT: 33.5 % — ABNORMAL LOW (ref 36.0–46.0)
Hemoglobin: 12.2 g/dL (ref 12.0–15.0)
Immature Granulocytes: 1 %
Lymphocytes Relative: 44 %
Lymphs Abs: 3.1 10*3/uL (ref 0.7–4.0)
MCH: 33.5 pg (ref 26.0–34.0)
MCHC: 36.4 g/dL — ABNORMAL HIGH (ref 30.0–36.0)
MCV: 92 fL (ref 80.0–100.0)
Monocytes Absolute: 0.6 10*3/uL (ref 0.1–1.0)
Monocytes Relative: 8 %
Neutro Abs: 2.5 10*3/uL (ref 1.7–7.7)
Neutrophils Relative %: 36 %
Platelets: 265 10*3/uL (ref 150–400)
RBC: 3.64 MIL/uL — ABNORMAL LOW (ref 3.87–5.11)
RDW: 11.8 % (ref 11.5–15.5)
Smear Review: NORMAL
WBC: 7 10*3/uL (ref 4.0–10.5)
nRBC: 0 % (ref 0.0–0.2)

## 2021-03-08 LAB — PHOSPHORUS: Phosphorus: 3.1 mg/dL (ref 2.5–4.6)

## 2021-03-08 LAB — MAGNESIUM: Magnesium: 1.7 mg/dL (ref 1.7–2.4)

## 2021-03-08 MED ORDER — MAGNESIUM SULFATE 2 GM/50ML IV SOLN
2.0000 g | Freq: Once | INTRAVENOUS | Status: AC
  Administered 2021-03-08: 2 g via INTRAVENOUS
  Filled 2021-03-08: qty 50

## 2021-03-08 MED ORDER — PANTOPRAZOLE SODIUM 40 MG PO TBEC: 40.0000 mg | DELAYED_RELEASE_TABLET | Freq: Two times a day (BID) | ORAL | 0 refills | Status: DC

## 2021-03-08 MED ORDER — POTASSIUM CHLORIDE CRYS ER 20 MEQ PO TBCR
40.0000 meq | EXTENDED_RELEASE_TABLET | Freq: Two times a day (BID) | ORAL | Status: DC
  Administered 2021-03-08: 40 meq via ORAL
  Filled 2021-03-08: qty 2

## 2021-03-08 NOTE — Plan of Care (Signed)
Patient discharged to home with family.  Patient tolerated soft diet.  Discharge instructions reviewed with patient including appointments, medication changes and follow-up care. PIV removed prior to discharge.

## 2021-03-08 NOTE — Discharge Summary (Signed)
Physician Discharge Summary  Sharyon Peitz ZOX:[id number] DOB: 04-Sep-1980 DOA: 03/04/2021  PCP: [name], MD  Admit date: 03/04/2021 Discharge date: 03/08/2021  Admitted From: Home Disposition: Home  Recommendations for Outpatient Follow-up:  Follow up with PCP in 1-2 weeks Follow up with General Surgery if needed  Please obtain CMP/CBC, Mag, Phos in one week Please follow up on the following pending results:  Home Health: No  Equipment/Devices: None    Discharge Condition: Stable  CODE STATUS: FULL CODE Diet recommendation: SOFT Diet   Brief/Interim Summary: The patient is a 40 year old overweight Caucasian female with past medical history significant for but not limited to anxiety and depression, nephrolithiasis, migraine headaches, as well as other comorbidities who is status post gastric banding and presented to [facility] ED on 03/04/2021 with progressive left-sided abdominal pain associated nausea, vomiting and diarrhea.  She had this onset over the last 2 to 3 days and described the pain as very sharp and severe in the left side of abdomen but not any dark or bloody stools.  In the ED she is found to have an elevated heart rate now soft blood pressure.  Urinalysis showed trace leukocytes, negative nitrites and no bacteria.  CT of the abdomen pelvis was done and showed dilated loops of small bowel throughout the abdomen measuring up to 3.5 cm with a transition point in the midline consistent with small bowel obstruction secondary to adhesions,, prior gastric band with reservoir in the right hemiabdomen and punctate noninfected bilateral renal stones.  General surgery was consulted and she had an NG tube placed and hospitalist admitted this patient for the management of acute small bowel obstruction.  SHe was improving and had her NG tube removed and diet was advanced to a soft diet however she vomited yesterday so general surgery cut back her diet to clear liquids and ordered  KUB which was unremarkable.  Diet was then further advanced to soft diet and she tolerated overnight and today she is medically stable to be discharged as she is tolerating her diet without issues.  She complained of some sinus pressure but states her headache is improved.  She denies other concerns or complaints this time and will follow up with PCP and if necessary with general surgery outpatient setting.  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, Revised 12/25/12 due to slip   Chronic anxiety   SBO (small bowel obstruction) (HCC)  Small bowel obstruction, in the setting of adhesions from prior gastric band surgery, improved -She presented the ED due to a 3-day history of sharp left-sided abdominal pain that was associated with nausea, vomiting, diarrhea -CT scan of the abdomen pelvis is notable for small bowel obstruction with a transition point in the midline likely secondary ideation in the setting of her previous gastric band procedure -General surgery was consulted and appreciate formal evaluation Assistant -She had her NG tube clamped yesterday and this was removed -Diet was slowly advanced and she tolerated well -Repeat KUB done yesterday was unremarkable and she was not as nauseous after and her diet was advanced back to soft diet -Continue with supportive care and antiemetics with IV Zofran as well as pain control -Ensure magnesium is above 2 and potassium above 4   -Continue supportive care and mobilization -General surgery felt that from a surgical perspective she tolerated her diet advancement that she could be safely discharged and today she was not  having any nausea or vomiting -Per general surgery she does not need surgical follow-up at discharge   Hypokalemia/Hypomagnesemia -Potassium level is now 3.5 and an magnesium level was 1.7 and replete prior to  discharge -Etiology is likely secondary to GI losses with nausea vomiting and diarrhea as well as poor p.o. intake in the setting of SBR -Continue to monitor and replete as necessary -Replete potassium prior to discharge -Repeat CMP and magnesium level in the a.m.   Leukocytosis -Mild and likely reactive in the setting of her small bowel obstruction -WBC went from 19.5 -> 16.9 -> 13.2 -> 11.0 and today is improved and normalized at 7.0 -She was continued on IV fluid hydration with normal saline 100 MLS per hour which has now stopped  -Continue monitor and trend and repeat CBC in a.m.   Depression and Anxiety -Continue with sertraline 100 mg p.o. daily; she takes alprazolam 1 mg p.o. 3 times daily as needed at home but currently on lorazepam 0.5 mg IV every 4 as needed for anxiety which will need to be changed back to p.o.   Migraine Headaches, chronic -Continue amitriptyline 100 mg p.o. nightly and she also has ketorolac 30 mg IV every 6 as needed from mild to moderate pain -Follow-up with neurology in outpatient setting as her migraines are improved   GERD -Continue with Pantoprazole IV 40 mg every 12 for now and change back to p.o. when she is able to tolerate her diet   Normocytic Anemia -Likely reactive in the setting of dilutional drop -Patient's hemoglobin/marker went from 16.2/45.4 -> 13.6/38.1 -> 12.8/35.1 -> 11.9/33.8 and today is 12.2/33.5 and stable -Anemia panel was checked and showed an iron level of 30, U IBC of 201, TIBC of 231, saturation ratios of 13%, ferritin level 68, folate level 7.1, vitamin B12 level 567 -Continue monitor for signs and symptoms bleeding; currently no overt bleeding noted -Repeat CBC in a.m.  Discharge Instructions  Discharge Instructions     Call MD for:  difficulty breathing, headache or visual disturbances   Complete by: As directed    Call MD for:  extreme fatigue   Complete by: As directed    Call MD for:  hives   Complete by: As  directed    Call MD for:  persistant dizziness or light-headedness   Complete by: As directed    Call MD for:  persistant nausea and vomiting   Complete by: As directed    Call MD for:  redness, tenderness, or signs of infection (pain, swelling, redness, odor or green/yellow discharge around incision site)   Complete by: As directed    Call MD for:  severe uncontrolled pain   Complete by: As directed    Call MD for:  temperature >100.4   Complete by: As directed    Diet - low sodium heart healthy   Complete by: As directed    Soft   Discharge instructions   Complete by: As directed    You were cared for by a hospitalist during your hospital stay. If you have any questions about your discharge medications or the care you received while you were in the hospital after you are discharged, you can call the unit and ask to speak with the hospitalist on call if the hospitalist that took care of you is not available. Once you are discharged, your primary care physician will handle any further medical issues. Please note that NO REFILLS for any discharge medications will be authorized once you are  discharged, as it is imperative that you return to your primary care physician (or establish a relationship with a primary care physician if you do not have one) for your aftercare needs so that they can reassess your need for medications and monitor your lab values.  Follow up with PCP and General Surgery if necessary. Take all medications as prescribed. If symptoms change or worsen please return to the ED for evaluation   Increase activity slowly   Complete by: As directed       Allergies as of 03/08/2021       Reactions   Imitrex [sumatriptan] Nausea And Vomiting        Medication List     STOP taking these medications    esomeprazole 40 MG capsule Commonly known as: NexIUM       TAKE these medications    ALPRAZolam 1 MG tablet Commonly known as: XANAX TAKE 1/2 TO 1 TABLET EVERY 4  HOURS AS NEEDED FOR      ANXIETY What changed:  how much to take how to take this when to take this reasons to take this additional instructions   amitriptyline 100 MG tablet Commonly known as: ELAVIL TAKE 1 TABLET DAILY What changed: when to take this   Calcium Carb-Cholecalciferol 500-400 MG-UNIT Tabs Take 1 tablet by mouth daily.   levonorgestrel 20 MCG/24HR IUD Commonly known as: MIRENA 1 each by Intrauterine route once. Inserted 01/21/2020   multivitamin with minerals tablet Take 1 tablet by mouth daily.   pantoprazole 40 MG tablet Commonly known as: PROTONIX Take 1 tablet (40 mg total) by mouth 2 (two) times daily. What changed: when to take this   phentermine 37.5 MG tablet Commonly known as: ADIPEX-P Take 37.5 mg by mouth daily.   sertraline 100 MG tablet Commonly known as: ZOLOFT Take 1.5 tablets (150 mg total) by mouth daily. What changed: how much to take        Follow-up Information     [facility] Follow up.   Specialty: General Surgery Why: As needed; she does not need follow up. This is to provide our contact information Contact information: [address] [place] [address] [phone]        [name], MD. Go on 03/26/2021.   Specialty: Family Medicine Why: Follow up within 1-2 weeks 10am appointment Contact information: [address] [place] [address] [phone]         Gaynelle Adu, MD. Go on 04/05/2021.   Specialty: General Surgery Why: Arrive time 8:45 Contact information: [address] [place] [address] [phone]                Allergies  Allergen Reactions   Imitrex [Sumatriptan] Nausea And Vomiting   Consultations: General Surgery  Procedures/Studies: DG Abdomen 1 View  Result Date: 03/04/2021 CLINICAL DATA:  NG tube placement EXAM: ABDOMEN - 1 VIEW COMPARISON:  12/12/2020 FINDINGS: NG tube tip is in the distal  stomach. Lap band device is in place. Gaseous distention of the stomach and bowel diffusely. IMPRESSION: NG tube tip in the distal stomach. Diffuse gaseous distention of bowel including stomach. Electronically Signed   By: Charlett Nose M.D.   On: 03/04/2021 19:29   CT ABDOMEN PELVIS W CONTRAST  Result Date: 03/04/2021 CLINICAL DATA:  Nausea vomiting diarrhea and left upper quadrant pain since Friday. EXAM: CT ABDOMEN AND PELVIS WITH CONTRAST TECHNIQUE: Multidetector CT imaging of the abdomen and pelvis was performed  using the standard protocol following bolus administration of intravenous contrast. CONTRAST:  OMNIPAQUE IOHEXOL 300 MG/ML  SOLN COMPARISON:  CT December 12, 2020 FINDINGS: Lower chest: No acute abnormality. Hepatobiliary: No suspicious hepatic lesion. Gallbladder is unremarkable. No biliary ductal dilation. Pancreas: No pancreatic ductal dilation or evidence of acute inflammation. Spleen: Within normal limits. Adrenals/Urinary Tract: Adrenal glands are unremarkable. No hydronephrosis. Punctate nonobstructive bilateral renal stones. Subcentimeter hypodense bilateral renal lesions which are technically too small to accurately characterize but statistically likely represent cysts. No solid enhancing renal mass. Bladder is unremarkable for degree of distension. Stomach/Bowel: Prior gastric band with reservoir in the right hemiabdomen. Stomach is distended with gas and enteric contents. Dilated loops of small bowel throughout the abdomen measuring up to 3.5 cm with a transition to decompressed bowel in the midline pelvis on image 75/2. Gas fluid levels throughout the colon. Vascular/Lymphatic: No significant vascular findings are present. No enlarged abdominal or pelvic lymph nodes. Reproductive: Intrauterine device appears appropriate in positioning. No suspicious adnexal lesion. Other: No abdominopelvic ascites. Musculoskeletal: No acute or significant osseous findings. IMPRESSION: 1. Dilated loops  of small bowel throughout the abdomen measuring up to 3.5 cm with a transition to decompressed bowel in the midline. Findings are most consistent with small bowel obstruction likely due to adhesions. 2. Prior gastric band with reservoir in the right hemiabdomen. 3. Punctate nonobstructive bilateral renal stones. Electronically Signed   By: Maudry Mayhew M.D.   On: 03/04/2021 18:37   DG Abd 2 Views  Result Date: 03/07/2021 CLINICAL DATA:  Small-bowel obstruction EXAM: ABDOMEN - 2 VIEW COMPARISON:  03/06/2021 FINDINGS: Normal bowel gas pattern. No dilated bowel loops. Right hemidiaphragm not included on the study therefore assessment for free air not possible. Repeat if there is clinical concern of bowel perforation NG tube removed. Gastric lap band unchanged in position. IUD in the pelvis. IMPRESSION: Normal bowel gas pattern without obstruction Right hemidiaphragm not included. This study cannot exclude free air. Electronically Signed   By: Marlan Palau M.D.   On: 03/07/2021 14:33   DG Abd 2 Views  Result Date: 03/06/2021 CLINICAL DATA:  Small-bowel obstruction, abdominal pain EXAM: ABDOMEN - 2 VIEW COMPARISON:  KUB 03/04/2021 FINDINGS: Enteric catheter tip and side hole projects over the stomach. A lap band is in place in appropriate position. There is a nonobstructive bowel gas pattern. The lung bases are clear. The bones are unremarkable. An IUD is seen projecting over the pelvis. IMPRESSION: 1. Nonobstructive bowel gas pattern. 2. Lap band in appropriate position. Electronically Signed   By: Lesia Hausen M.D.   On: 03/06/2021 09:08     Subjective: Seen and examined at bedside and she is doing much better and had no issues with her diet and tolerated it fairly well.  Had no more nausea or vomiting.  Headache had improved significantly but did have some sinus pressure today.  Denies any lightheadedness or dizziness.  No other concerns or complaints this time is stable for discharge at this  time  Discharge Exam: Vitals:   03/08/21 0405 03/08/21 0750  BP: (!) 99/55 99/65  Pulse: 62 83  Resp: 16 18  Temp: 97.8 F (36.6 C) (!) 97.5 F (36.4 C)  SpO2: 99% 100%   Vitals:   03/07/21 1535 03/07/21 1900 03/08/21 0405 03/08/21 0750  BP: (!) 96/56 109/71 (!) 99/55 99/65  Pulse: 71 86 62 83  Resp: 15 16 16 18   Temp: 97.8 F (36.6 C) 97.7 F (36.5 C) 97.8 F (  36.6 C) (!) 97.5 F (36.4 C)  TempSrc: Oral Oral Oral Oral  SpO2: 100% 99% 99% 100%  Weight:      Height:       General: Pt is alert, awake, not in acute distress Cardiovascular: RRR, S1/S2 +, no rubs, no gallops Respiratory: Diminished bilaterally, no wheezing, no rhonchi; unlabored breathing Abdominal: Soft, NT, distended secondary body habitus, bowel sounds + Extremities: no edema, no cyanosis  The results of significant diagnostics from this hospitalization (including imaging, microbiology, ancillary and laboratory) are listed below for reference.    Microbiology: Recent Results (from the past 240 hour(s))  Resp Panel by RT-PCR (Flu A&B, Covid) Nasopharyngeal Swab     Status: None   Collection Time: 03/04/21  7:04 PM   Specimen: Nasopharyngeal Swab; Nasopharyngeal(NP) swabs in vial transport medium  Result Value Ref Range Status   SARS Coronavirus 2 by RT PCR NEGATIVE NEGATIVE Final    Comment: (NOTE) SARS-CoV-2 target nucleic acids are NOT DETECTED.  The SARS-CoV-2 RNA is generally detectable in upper respiratory specimens during the acute phase of infection. The lowest concentration of SARS-CoV-2 viral copies this assay can detect is 138 copies/mL. A negative result does not preclude SARS-Cov-2 infection and should not be used as the sole basis for treatment or other patient management decisions. A negative result may occur with  improper specimen collection/handling, submission of specimen other than nasopharyngeal swab, presence of viral mutation(s) within the areas targeted by this assay, and  inadequate number of viral copies(<138 copies/mL). A negative result must be combined with clinical observations, patient history, and epidemiological information. The expected result is Negative.  Fact Sheet for Patients:  BloggerCourse.com  Fact Sheet for Healthcare Providers:  SeriousBroker.it  This test is no t yet approved or cleared by the [place] FDA and  has been authorized for detection and/or diagnosis of SARS-CoV-2 by FDA under an Emergency Use Authorization (EUA). This EUA will remain  in effect (meaning this test can be used) for the duration of the COVID-19 declaration under Section 564(b)(1) of the Act, 21 U.S.C.section 360bbb-3(b)(1), unless the authorization is terminated  or revoked sooner.       Influenza A by PCR NEGATIVE NEGATIVE Final   Influenza B by PCR NEGATIVE NEGATIVE Final    Comment: (NOTE) The Xpert Xpress SARS-CoV-2/FLU/RSV plus assay is intended as an aid in the diagnosis of influenza from Nasopharyngeal swab specimens and should not be used as a sole basis for treatment. Nasal washings and aspirates are unacceptable for Xpert Xpress SARS-CoV-2/FLU/RSV testing.  Fact Sheet for Patients: BloggerCourse.com  Fact Sheet for Healthcare Providers: SeriousBroker.it  This test is not yet approved or cleared by the [place] FDA and has been authorized for detection and/or diagnosis of SARS-CoV-2 by FDA under an Emergency Use Authorization (EUA). This EUA will remain in effect (meaning this test can be used) for the duration of the COVID-19 declaration under Section 564(b)(1) of the Act, 21 U.S.C. section 360bbb-3(b)(1), unless the authorization is terminated or revoked.  Performed at [facility], [address] [place], [place] [address]     Labs: BNP (last 3 results) No results for input(s): BNP in the last 8760  hours. Basic Metabolic Panel: Recent Labs  Lab 03/04/21 1534 03/05/21 0550 03/06/21 0734 03/07/21 0249 03/08/21 0550  NA 138 139 137 135 137  K 3.2* 3.2* 3.0* 3.7 3.5  CL 102 107 103 106 107  CO2 19* 22 22 22 24   GLUCOSE 129* 96 92 83  85  BUN 9 8 <5* 6 8  CREATININE 0.78 0.69 0.59 0.51 0.46  CALCIUM 10.6* 8.9 9.0 8.7* 9.0  MG  --  1.5* 1.8 2.0 1.7  PHOS  --   --   --   --  3.1   Liver Function Tests: Recent Labs  Lab 03/04/21 1534 03/05/21 0550 03/08/21 0550  AST 29 17 12*  ALT 15 12 11   ALKPHOS 81 61 52  BILITOT 1.4* 0.9 0.9  PROT 8.6* 6.5 6.1*  ALBUMIN 5.1* 4.0 3.3*   Recent Labs  Lab 03/04/21 1534  LIPASE 33   No results for input(s): AMMONIA in the last 168 hours. CBC: Recent Labs  Lab 03/04/21 1534 03/05/21 0550 03/06/21 0734 03/07/21 0249 03/08/21 0550  WBC 19.5* 16.9* 13.2* 11.0* 7.0  NEUTROABS  --   --   --   --  2.5  HGB 16.2* 13.6 12.8 11.9* 12.2  HCT 45.4 38.1 35.1* 33.8* 33.5*  MCV 92.5 94.8 92.4 92.6 92.0  PLT 377 290 272 244 265   Cardiac Enzymes: No results for input(s): CKTOTAL, CKMB, CKMBINDEX, TROPONINI in the last 168 hours. BNP: Invalid input(s): POCBNP CBG: No results for input(s): GLUCAP in the last 168 hours. D-Dimer No results for input(s): DDIMER in the last 72 hours. Hgb A1c No results for input(s): HGBA1C in the last 72 hours. Lipid Profile No results for input(s): CHOL, HDL, LDLCALC, TRIG, CHOLHDL, LDLDIRECT in the last 72 hours. Thyroid function studies No results for input(s): TSH, T4TOTAL, T3FREE, THYROIDAB in the last 72 hours.  Invalid input(s): FREET3 Anemia work up Recent Labs    03/07/21 0249 03/07/21 0734  VITAMINB12  --  567  FOLATE 7.1  --   FERRITIN 68  --   TIBC 231*  --   IRON 30  --   RETICCTPCT 1.6  --    Urinalysis    Component Value Date/Time   COLORURINE AMBER (A) 03/04/2021 1729   APPEARANCEUR CLOUDY (A) 03/04/2021 1729   APPEARANCEUR Cloudy (A) 12/26/2020 0828   LABSPEC 1.023  03/04/2021 1729   LABSPEC 1.025 10/21/2011 2131   PHURINE 5.0 03/04/2021 1729   GLUCOSEU NEGATIVE 03/04/2021 1729   GLUCOSEU Negative 10/21/2011 2131   HGBUR NEGATIVE 03/04/2021 1729   BILIRUBINUR SMALL (A) 03/04/2021 1729   BILIRUBINUR Negative 12/26/2020 0828   BILIRUBINUR Negative 10/21/2011 2131   KETONESUR 20 (A) 03/04/2021 1729   PROTEINUR 100 (A) 03/04/2021 1729   UROBILINOGEN 2.0 11/30/2015 1054   UROBILINOGEN 1 07/14/2014 1009   NITRITE NEGATIVE 03/04/2021 1729   LEUKOCYTESUR TRACE (A) 03/04/2021 1729   LEUKOCYTESUR Negative 10/21/2011 2131   Sepsis Labs Invalid input(s): PROCALCITONIN,  WBC,  LACTICIDVEN Microbiology Recent Results (from the past 240 hour(s))  Resp Panel by RT-PCR (Flu A&B, Covid) Nasopharyngeal Swab     Status: None   Collection Time: 03/04/21  7:04 PM   Specimen: Nasopharyngeal Swab; Nasopharyngeal(NP) swabs in vial transport medium  Result Value Ref Range Status   SARS Coronavirus 2 by RT PCR NEGATIVE NEGATIVE Final    Comment: (NOTE) SARS-CoV-2 target nucleic acids are NOT DETECTED.  The SARS-CoV-2 RNA is generally detectable in upper respiratory specimens during the acute phase of infection. The lowest concentration of SARS-CoV-2 viral copies this assay can detect is 138 copies/mL. A negative result does not preclude SARS-Cov-2 infection and should not be used as the sole basis for treatment or other patient management decisions. A negative result may occur with  improper specimen collection/handling, submission of  specimen other than nasopharyngeal swab, presence of viral mutation(s) within the areas targeted by this assay, and inadequate number of viral copies(<138 copies/mL). A negative result must be combined with clinical observations, patient history, and epidemiological information. The expected result is Negative.  Fact Sheet for Patients:  BloggerCourse.com  Fact Sheet for Healthcare Providers:   SeriousBroker.it  This test is no t yet approved or cleared by the [place] FDA and  has been authorized for detection and/or diagnosis of SARS-CoV-2 by FDA under an Emergency Use Authorization (EUA). This EUA will remain  in effect (meaning this test can be used) for the duration of the COVID-19 declaration under Section 564(b)(1) of the Act, 21 U.S.C.section 360bbb-3(b)(1), unless the authorization is terminated  or revoked sooner.       Influenza A by PCR NEGATIVE NEGATIVE Final   Influenza B by PCR NEGATIVE NEGATIVE Final    Comment: (NOTE) The Xpert Xpress SARS-CoV-2/FLU/RSV plus assay is intended as an aid in the diagnosis of influenza from Nasopharyngeal swab specimens and should not be used as a sole basis for treatment. Nasal washings and aspirates are unacceptable for Xpert Xpress SARS-CoV-2/FLU/RSV testing.  Fact Sheet for Patients: BloggerCourse.com  Fact Sheet for Healthcare Providers: SeriousBroker.it  This test is not yet approved or cleared by the [place] FDA and has been authorized for detection and/or diagnosis of SARS-CoV-2 by FDA under an Emergency Use Authorization (EUA). This EUA will remain in effect (meaning this test can be used) for the duration of the COVID-19 declaration under Section 564(b)(1) of the Act, 21 U.S.C. section 360bbb-3(b)(1), unless the authorization is terminated or revoked.  Performed at [facility], [address], [place], [place] [address]    Time coordinating discharge: 35 minutes  SIGNED:  Merlene Laughter, DO [facility] 03/08/2021, 6:27 PM Pager is on AMION  If 7PM-7AM, please contact night-coverage [email or web address]

## 2021-03-08 NOTE — Progress Notes (Signed)
[facility] SURGICAL PROGRESS NOTE (cpt [phone])  Hospital Day(s): 4.   Interval History: Patient seen and examined, no acute events or new complaints overnight. Patient reports that she is feeling better this morning. She continues to remain without abdominal pain, distension, fever, chills, nausea, emesis. She does stil have a headache but this is improved significantly. She looks much more bright this morning. Labs this morning are reassuring. She resumed soft diet last night without issues. She is passing flatus and is had a bowel movement.   Review of Systems:  Constitutional: denies fever, chills  HEENT: + headache (improved) Respiratory: denies any shortness of breath  Cardiovascular: denies chest pain or palpitations  Gastrointestinal: denies abdominal pain, N/V, or diarrhea/and bowel function as per interval history Genitourinary: denies burning with urination or urinary frequency   Vital signs in last 24 hours: [min-max] current  Temp:  [97.7 F (36.5 C)-98 F (36.7 C)] 97.8 F (36.6 C) (10/20 0405) Pulse Rate:  [62-97] 62 (10/20 0405) Resp:  [15-16] 16 (10/20 0405) BP: (95-109)/(55-71) 99/55 (10/20 0405) SpO2:  [99 %-100 %] 99 % (10/20 0405)     Height: 5\' 5"  (165.1 cm) Weight: 81.6 kg BMI (Calculated): 29.95   Intake/Output last 2 shifts:  10/19 0701 - 10/20 0700 In: 720 [P.O.:720] Out: -    Physical Exam:  Constitutional: alert, cooperative and no distress  HENT: normocephalic without obvious abnormality Eyes: PERRL, EOM's grossly intact and symmetric  Respiratory: breathing non-labored at rest  Cardiovascular: regular rate and sinus rhythm  Gastrointestinal: Soft, non-tender, and non-distended, no rebound/guarding Musculoskeletal: no edema or wounds, motor and sensation grossly intact, NT    Labs:  CBC Latest Ref Rng & Units 03/08/2021 03/07/2021 03/06/2021  WBC 4.0 - 10.5 K/uL 7.0 11.0(H) 13.2(H)  Hemoglobin 12.0 - 15.0 g/dL 03/08/2021 11.9(L) 12.8   Hematocrit 36.0 - 46.0 % 33.5(L) 33.8(L) 35.1(L)  Platelets 150 - 400 K/uL 265 244 272   CMP Latest Ref Rng & Units 03/08/2021 03/07/2021 03/06/2021  Glucose 70 - 99 mg/dL 85 83 92  BUN 6 - 20 mg/dL 8 6 03/08/2021)  Creatinine <4(W - 1.00 mg/dL 5.46 2.70 3.50  Sodium 135 - 145 mmol/L 137 135 137  Potassium 3.5 - 5.1 mmol/L 3.5 3.7 3.0(L)  Chloride 98 - 111 mmol/L 107 106 103  CO2 22 - 32 mmol/L 24 22 22   Calcium 8.9 - 10.3 mg/dL 9.0 0.93) 9.0  Total Protein 6.5 - 8.1 g/dL 6.1(L) - -  Total Bilirubin 0.3 - 1.2 mg/dL 0.9 - -  Alkaline Phos 38 - 126 U/L 52 - -  AST 15 - 41 U/L 12(L) - -  ALT 0 - 44 U/L 11 - -    Imaging studies: No new pertinent imaging studies today    Assessment/Plan: (ICD-10's: K25.609) 40 y.o. female with clinically and radiographically resolving small bowel obstruction, most likely secondary to post-surgical adhesions   - Continue soft diet             - Monitor abdominal examination +/- serial KUBs             - Monitor on-going bowel function             - Pain control prn (avoid/limit narcotics); antiemetics              - No emergent surgical intervention. She understands that should she fail to improve or clinically resolve, we may need to consider more urgent interventions.              -  Mobilization encouraged              - Further management per primary service   - Discharge Planning; Okay for discharge from surgical perspective today, she does NOT need surgical follow up. I will provide our contact information if needed    All of the above findings and recommendations were discussed with the patient and her family at bedside, and all of their questions were answered to their expressed satisfaction.  -- Lynden Oxford, PA-C  [facility] 03/08/2021, 7:30 AM [phone] M-F: 7am - 4pm

## 2021-03-14 ENCOUNTER — Telehealth: Payer: Self-pay

## 2021-03-14 NOTE — Telephone Encounter (Signed)
Transition Care Management Unsuccessful Follow-up Telephone Call  Date of discharge and from where:  03/08/21 from [facility]  Attempts:  1st Attempt  Reason for unsuccessful TCM follow-up call:  Left voice message    Kathyrn Sheriff, RN, MSN, BSN, CCM [facility] Care Management Coordinator [phone]

## 2021-03-16 ENCOUNTER — Telehealth: Payer: Self-pay

## 2021-03-16 NOTE — Telephone Encounter (Signed)
Transition Care Management Unsuccessful Follow-up Telephone Call  Date of discharge and from where:  03/08/2021 [facility]  Attempts:  2nd Attempt  Reason for unsuccessful TCM follow-up call:  No answer/busy  Rowe Pavy, RN, BSN, CEN [facility] Coordinator [phone]

## 2021-03-19 ENCOUNTER — Other Ambulatory Visit: Payer: Self-pay

## 2021-03-19 ENCOUNTER — Encounter: Payer: Self-pay | Admitting: Dermatology

## 2021-03-19 ENCOUNTER — Ambulatory Visit: Payer: BC Managed Care – PPO | Admitting: Dermatology

## 2021-03-19 DIAGNOSIS — L578 Other skin changes due to chronic exposure to nonionizing radiation: Secondary | ICD-10-CM

## 2021-03-19 DIAGNOSIS — L821 Other seborrheic keratosis: Secondary | ICD-10-CM

## 2021-03-19 DIAGNOSIS — L820 Inflamed seborrheic keratosis: Secondary | ICD-10-CM

## 2021-03-19 NOTE — Progress Notes (Signed)
   New Patient Visit  Subjective  Kristen [name] is a 40 y.o. female who presents for the following: Skin Tag (Check tags on her neck and between breast growing and changing color ).  The following portions of the chart were reviewed this encounter and updated as appropriate:   Tobacco  Allergies  Meds  Problems  Med Hx  Surg Hx  Fam Hx     Review of Systems:  No other skin or systemic complaints except as noted in HPI or Assessment and Plan.  Objective  Well appearing patient in no apparent distress; mood and affect are within normal limits.  A focused examination was performed including face,neck,chest. Relevant physical exam findings are noted in the Assessment and Plan.  left neck x 2, right breast x 2  (4) (4) Erythematous keratotic or waxy stuck-on papule or plaque.    Assessment & Plan  Inflamed seborrheic keratosis left neck x 2, right breast x 2  (4)  Reassured benign age-related growth.  Recommend observation.  Discussed cryotherapy if spot(s) become irritated or inflamed.   Destruction of lesion - left neck x 2, right breast x 2  (4) Complexity: simple   Destruction method: cryotherapy   Informed consent: discussed and consent obtained   Timeout:  patient name, date of birth, surgical site, and procedure verified Lesion destroyed using liquid nitrogen: Yes   Region frozen until ice ball extended beyond lesion: Yes   Outcome: patient tolerated procedure well with no complications   Post-procedure details: wound care instructions given    Seborrheic Keratoses - Stuck-on, waxy, tan-brown papules and/or plaques  - Benign-appearing - Discussed benign etiology and prognosis. - Observe - Call for any changes  Actinic Damage - chronic, secondary to cumulative UV radiation exposure/sun exposure over time - diffuse scaly erythematous macules with underlying dyspigmentation - Recommend daily broad spectrum sunscreen SPF 30+ to sun-exposed areas, reapply every 2  hours as needed.  - Recommend staying in the shade or wearing long sleeves, sun glasses (UVA+UVB protection) and wide brim hats (4-inch brim around the entire circumference of the hat). - Call for new or changing lesions.  Return if symptoms worsen or fail to improve.  IAngelique Holm, CMA, am acting as scribe for Armida Sans, MD .  Documentation: I have reviewed the above documentation for accuracy and completeness, and I agree with the above.  Armida Sans, MD

## 2021-03-19 NOTE — Patient Instructions (Signed)

## 2021-03-26 ENCOUNTER — Ambulatory Visit: Payer: BC Managed Care – PPO | Admitting: Family Medicine

## 2021-03-26 ENCOUNTER — Encounter: Payer: Self-pay | Admitting: Family Medicine

## 2021-03-26 ENCOUNTER — Other Ambulatory Visit: Payer: Self-pay

## 2021-03-26 VITALS — BP 94/64 | HR 89 | Temp 98.3°F | Wt 191.0 lb

## 2021-03-26 DIAGNOSIS — K56609 Unspecified intestinal obstruction, unspecified as to partial versus complete obstruction: Secondary | ICD-10-CM | POA: Diagnosis not present

## 2021-03-26 DIAGNOSIS — F410 Panic disorder [episodic paroxysmal anxiety] without agoraphobia: Secondary | ICD-10-CM

## 2021-03-26 MED ORDER — ALPRAZOLAM 1 MG PO TABS: ORAL_TABLET | ORAL | 1 refills | Status: DC

## 2021-03-26 NOTE — Progress Notes (Signed)
Established patient visit   Patient: Kristen [name]   DOB: 08-13-1980   40 y.o. Female  MRN: [id number] Visit Date: 03/26/2021  Today's healthcare provider: [name], MD   Chief Complaint  Patient presents with   Hospitalization Follow-up   Subjective    HPI  Follow up Hospitalization  Patient was admitted to [facility] on 03/04/2021 and discharged on 03/08/2021. She was treated for Small bowel obstruction. Treatment for this included see hospital note and see follow up recommendations below.   Recommendations for Outpatient Follow-up:  Follow up with PCP in 1-2 weeks Follow up with General Surgery if needed  Please obtain CMP/CBC, Mag, Phos in one week Please follow up on the following pending results   Telephone follow up was done on attempted 03/14/2021 and 03/16/2021 She reports excellent compliance with treatment. She reports this condition is improved but not back to baseline. She is able to keep fluids down, some soft foods, but still get nauseated, but is unable to keep down solids.  She Is scheduled to follow up with Dr. Iran Sizer (bariatric surgery) on November 17th.   -----------------------------------------------------------------------------------------  Past Surgical History:  Procedure Laterality Date   CYSTOSCOPY/URETEROSCOPY/HOLMIUM LASER/STENT PLACEMENT Right 12/18/2020   Procedure: CYSTOSCOPY/URETEROSCOPY/HOLMIUM LASER/STENT PLACEMENT;  Surgeon: Vanna Scotland, MD;  Location: [facility] ORS;  Service: Urology;  Laterality: Right;   HERNIA REPAIR  1985   INTRAUTERINE DEVICE INSERTION  01/27/2015   Mirena   LAPAROSCOPIC GASTRIC BANDING N/A 12/26/2012   Procedure: LAPAROSCOPIC GASTRIC BANDING REVISION ;  Surgeon: Atilano Ina, MD;  Location: [facility] ORS;  Service: General;  Laterality: N/A;   TONSILLECTOMY AND ADENOIDECTOMY  2007      Medications: Outpatient Medications Prior to Visit  Medication Sig   ALPRAZolam (XANAX) 1 MG tablet TAKE 1/2 TO 1 TABLET EVERY 4  HOURS AS NEEDED FOR      ANXIETY (Patient taking differently: Take 1 mg by mouth 3 (three) times daily as needed for anxiety.)   amitriptyline (ELAVIL) 100 MG tablet TAKE 1 TABLET DAILY (Patient taking differently: Take 100 mg by mouth at bedtime.)   levonorgestrel (MIRENA) 20 MCG/24HR IUD 1 each by Intrauterine route once. Inserted 01/21/2020   pantoprazole (PROTONIX) 40 MG tablet Take 1 tablet (40 mg total) by mouth 2 (two) times daily.   phentermine (ADIPEX-P) 37.5 MG tablet Take 37.5 mg by mouth daily.   sertraline (ZOLOFT) 100 MG tablet Take 1.5 tablets (150 mg total) by mouth daily. (Patient taking differently: Take 100 mg by mouth daily.)   Calcium Carb-Cholecalciferol 500-400 MG-UNIT TABS Take 1 tablet by mouth daily.   Multiple Vitamins-Minerals (MULTIVITAMIN WITH MINERALS) tablet Take 1 tablet by mouth daily.   No facility-administered medications prior to visit.    Review of Systems  Constitutional: Negative.   Respiratory: Negative.    Cardiovascular: Negative.   Gastrointestinal:  Positive for abdominal distention. Negative for abdominal pain, anal bleeding, blood in stool, constipation, diarrhea, nausea, rectal pain and vomiting.  Neurological:  Positive for headaches. Negative for dizziness and light-headedness.      Objective    BP 94/64 (BP Location: Right Arm, Patient Position: Sitting, Cuff Size: Large)   Pulse 89   Temp 98.3 F (36.8 C) (Oral)   Wt 191 lb (86.6 kg)   SpO2 99%   BMI 31.78 kg/m    Physical Exam  General appearance: Mildly obese female, cooperative and in no acute distress Head: Normocephalic, without obvious abnormality, atraumatic Respiratory: Respirations even and unlabored, normal  respiratory rate Extremities: All extremities are intact.  Skin: Skin color, texture, turgor normal. No rashes seen  Psych: Appropriate mood and affect. Neurologic: Mental status: Alert, oriented to person, place, and time, thought content appropriate.      Assessment & Plan     1. Small bowel obstruction (HCC) Somewhat improved, but still unable to keep down solid food. Check  - CBC - Comprehensive metabolic panel - Magnesium  She does have history of bariatric surgery although this was not considered the cause of the SBO.  - Ambulatory referral to Gastroenterology  Follow up with her bariatric surgeon 04-05-2021 as previously scheduled  2. Panic disorder without agoraphobia Well controlled.  Continue current medications.  refill- ALPRAZolam (XANAX) 1 MG tablet; TAKE 1/2 TO 1 TABLET EVERY 4 HOURS AS NEEDED FOR      ANXIETY  Dispense: 90 tablet; Refill: 1        The entirety of the information documented in the History of Present Illness, Review of Systems and Physical Exam were personally obtained by me. Portions of this information were initially documented by the CMA and reviewed by me for thoroughness and accuracy.     [name], MD  [facility] [phone] (phone) [phone] (fax)  [facility]

## 2021-03-27 ENCOUNTER — Telehealth: Payer: Self-pay

## 2021-03-27 LAB — CBC
Hematocrit: 36.6 % (ref 34.0–46.6)
Hemoglobin: 12 g/dL (ref 11.1–15.9)
MCH: 31 pg (ref 26.6–33.0)
MCHC: 32.8 g/dL (ref 31.5–35.7)
MCV: 95 fL (ref 79–97)
Platelets: 322 10*3/uL (ref 150–450)
RBC: 3.87 x10E6/uL (ref 3.77–5.28)
RDW: 11.9 % (ref 11.7–15.4)
WBC: 5.3 10*3/uL (ref 3.4–10.8)

## 2021-03-27 LAB — COMPREHENSIVE METABOLIC PANEL
ALT: 13 IU/L (ref 0–32)
AST: 15 IU/L (ref 0–40)
Albumin/Globulin Ratio: 2.4 — ABNORMAL HIGH (ref 1.2–2.2)
Albumin: 4.3 g/dL (ref 3.8–4.8)
Alkaline Phosphatase: 72 IU/L (ref 44–121)
BUN/Creatinine Ratio: 14 (ref 9–23)
BUN: 8 mg/dL (ref 6–24)
Bilirubin Total: 0.3 mg/dL (ref 0.0–1.2)
CO2: 22 mmol/L (ref 20–29)
Calcium: 9.3 mg/dL (ref 8.7–10.2)
Chloride: 106 mmol/L (ref 96–106)
Creatinine, Ser: 0.58 mg/dL (ref 0.57–1.00)
Globulin, Total: 1.8 g/dL (ref 1.5–4.5)
Glucose: 85 mg/dL (ref 70–99)
Potassium: 4.4 mmol/L (ref 3.5–5.2)
Sodium: 139 mmol/L (ref 134–144)
Total Protein: 6.1 g/dL (ref 6.0–8.5)
eGFR: 117 mL/min/{1.73_m2} (ref >59)

## 2021-03-27 LAB — MAGNESIUM: Magnesium: 2.1 mg/dL (ref 1.6–2.3)

## 2021-03-28 ENCOUNTER — Encounter: Payer: Self-pay | Admitting: Family Medicine

## 2021-03-28 NOTE — Telephone Encounter (Signed)
Patient needs to be seen in office. See referral.

## 2021-03-28 NOTE — Telephone Encounter (Signed)
Patient scheduled for 05/24/2021 at 2:30 PM with Dr. Allegra Lai.Marland Kitchen

## 2021-04-30 ENCOUNTER — Ambulatory Visit (INDEPENDENT_AMBULATORY_CARE_PROVIDER_SITE_OTHER): Payer: BC Managed Care – PPO | Admitting: Podiatry

## 2021-04-30 ENCOUNTER — Other Ambulatory Visit: Payer: Self-pay

## 2021-04-30 DIAGNOSIS — B351 Tinea unguium: Secondary | ICD-10-CM

## 2021-04-30 MED ORDER — TERBINAFINE HCL 250 MG PO TABS: 250.0000 mg | ORAL_TABLET | Freq: Every day | ORAL | 0 refills | Status: AC

## 2021-04-30 NOTE — Progress Notes (Signed)
  Subjective:  Patient ID: Kristen [name], female    DOB: May 02, 1981,  MRN: [id number]  Chief Complaint  Patient presents with   Nail Problem    Thick discolored toenails, 6 month follow up    40 y.o. female returns for follow-up with the above complaint. History confirmed with patient.  Seems to have had recurrence now the toenails are turning white again  Objective:  Physical Exam: warm, good capillary refill, no trophic changes or ulcerative lesions, normal DP and PT pulses and normal sensory exam.  Superficial white onychomycosis 1 through 4 bilaterally with hallux being the worst          Assessment:   1. Onychomycosis      Plan:  Patient was evaluated and treated and all questions answered.  Seems to have worsened.  I think this is likely the same infection that was not fully cured.  I recommend we put her back on Lamisil and reevaluate in 3 months and keep her on this until it is fully cleared.  New refill prescription sent to pharmacy    Return in about 3 months (around 07/29/2021) for follow up after nail fungus treatment.

## 2021-05-02 ENCOUNTER — Encounter: Payer: Self-pay | Admitting: Gastroenterology

## 2021-05-02 ENCOUNTER — Ambulatory Visit (INDEPENDENT_AMBULATORY_CARE_PROVIDER_SITE_OTHER): Payer: BC Managed Care – PPO | Admitting: Gastroenterology

## 2021-05-02 VITALS — BP 111/77 | HR 79 | Temp 98.6°F | Ht 65.0 in | Wt 189.6 lb

## 2021-05-02 DIAGNOSIS — Z8719 Personal history of other diseases of the digestive system: Secondary | ICD-10-CM | POA: Diagnosis not present

## 2021-05-02 DIAGNOSIS — Z9884 Bariatric surgery status: Secondary | ICD-10-CM

## 2021-05-02 NOTE — Progress Notes (Signed)
Kristen Darby, MD [address]  [place]  [place], [place] [address]  Main: [phone]  Fax: [phone]    Gastroenterology Consultation  Referring Provider:     Birdie Sons, MD Primary Care Physician:  Birdie Sons, MD Primary Gastroenterologist:  Dr. Cephas [name] Reason for Consultation:     History of small bowel obstruction        HPI:   Kristen [name] is a 40 y.o. female referred by Dr. Birdie Sons, MD  for consultation & management of hospital admission for small bowel obstruction in 02/2021.  Patient has history of laparoscopic adjustable gastric band in August 2012 by Dr. Redmond Pulling.  Patient has lost about 100 pounds since the Lap-Band.  Small bowel obstruction based on the CT scan was likely due to adhesions.  Patient was managed conservatively with NG tube placement, her symptoms resolved and she was slowly advance to soft diet at the time of discharge.  Patient had a follow-up with her bariatric surgeon who did not think this episode was related to Lap-Band.  Patient reports that since she was discharged from the hospital, she has been having regular bowel movements, 2-3 times a week which are hard.  She tried MiraLAX for 2 to 3 days after she was released from the hospital which did not help. She continues to have nausea, without vomiting, stays bloated all day long and states it is worse after eating. She denies any rectal bleeding.  No evidence of anemia. She denies tobacco use, marijuana use, alcohol use She denies any opioid use  NSAIDs: None  Antiplts/Anticoagulants/Anti thrombotics: None  GI Procedures: None She denies any family history of IBD  Past Medical History:  Diagnosis Date   Agoraphobia with panic attacks    Anxiety    Depression    Dyslipidemia    not requiring meds   Hernia    Kidney stone    Migraine headache     Past Surgical History:  Procedure Laterality Date   CYSTOSCOPY/URETEROSCOPY/HOLMIUM LASER/STENT  PLACEMENT Right 12/18/2020   Procedure: CYSTOSCOPY/URETEROSCOPY/HOLMIUM LASER/STENT PLACEMENT;  Surgeon: Hollice Espy, MD;  Location: [facility] ORS;  Service: Urology;  Laterality: Right;   HERNIA REPAIR  1985   INTRAUTERINE DEVICE INSERTION  01/27/2015   Mirena   LAPAROSCOPIC GASTRIC BANDING N/A 12/26/2012   Procedure: LAPAROSCOPIC GASTRIC BANDING REVISION ;  Surgeon: Gayland Curry, MD;  Location: [facility] ORS;  Service: General;  Laterality: N/A;   TONSILLECTOMY AND ADENOIDECTOMY  2007   Current Outpatient Medications:    ALPRAZolam (XANAX) 1 MG tablet, TAKE 1/2 TO 1 TABLET EVERY 4 HOURS AS NEEDED FOR      ANXIETY, Disp: 90 tablet, Rfl: 1   amitriptyline (ELAVIL) 100 MG tablet, TAKE 1 TABLET DAILY (Patient taking differently: Take 100 mg by mouth at bedtime.), Disp: 90 tablet, Rfl: 3   levonorgestrel (MIRENA) 20 MCG/24HR IUD, 1 each by Intrauterine route once. Inserted 01/21/2020, Disp: , Rfl:    pantoprazole (PROTONIX) 40 MG tablet, Take 1 tablet (40 mg total) by mouth 2 (two) times daily., Disp: 60 tablet, Rfl: 0   phentermine (ADIPEX-P) 37.5 MG tablet, Take 37.5 mg by mouth daily., Disp: , Rfl:    phentermine (ADIPEX-P) 37.5 MG tablet, Take by mouth., Disp: , Rfl:    sertraline (ZOLOFT) 100 MG tablet, Take 1.5 tablets (150 mg total) by mouth daily. (Patient taking differently: Take 100 mg by mouth daily.), Disp: 135 tablet, Rfl: 4   terbinafine (LAMISIL)  250 MG tablet, Take 1 tablet (250 mg total) by mouth daily., Disp: 90 tablet, Rfl: 0  Family History  Problem Relation Age of Onset   Hypertension Father    Stroke Father    Hyperlipidemia Father    Migraines Mother    Cancer Mother        cervical   Hyperlipidemia Mother    Bipolar disorder Sister    Migraines Sister    Breast cancer Paternal Grandmother 62     Social History   Tobacco Use   Smoking status: Never   Smokeless tobacco: Never  Vaping Use   Vaping Use: Never used  Substance Use Topics   Alcohol use: Not Currently     Alcohol/week: 0.0 standard drinks   Drug use: No    Allergies as of 05/02/2021 - Review Complete 05/02/2021  Allergen Reaction Noted   Imitrex [sumatriptan] Nausea And Vomiting 09/29/2017    Review of Systems:    All systems reviewed and negative except where noted in HPI.   Physical Exam:  BP 111/77 (BP Location: Left Arm, Patient Position: Sitting, Cuff Size: Normal)    Pulse 79    Temp 98.6 F (37 C) (Oral)    Ht '5\' 5"'  (1.651 m)    Wt 189 lb 9.6 oz (86 kg)    BMI 31.55 kg/m  No LMP recorded. (Menstrual status: IUD).  General:   Alert,  Well-developed, well-nourished, pleasant and cooperative in NAD Head:  Normocephalic and atraumatic. Eyes:  Sclera clear, no icterus.   Conjunctiva pink. Ears:  Normal auditory acuity. Nose:  No deformity, discharge, or lesions. Mouth:  No deformity or lesions,oropharynx pink & moist. Neck:  Supple; no masses or thyromegaly. Lungs:  Respirations even and unlabored.  Clear throughout to auscultation.   No wheezes, crackles, or rhonchi. No acute distress. Heart:  Regular rate and rhythm; no murmurs, clicks, rubs, or gallops. Abdomen:  Normal bowel sounds. Soft, palpable Lap-Band port in the right upper quadrant, left lower quadrant tenderness and non-distended without masses, hepatosplenomegaly or hernias noted.  No guarding or rebound tenderness.   Rectal: Not performed Msk:  Symmetrical without gross deformities. Good, equal movement & strength bilaterally. Pulses:  Normal pulses noted. Extremities:  No clubbing or edema.  No cyanosis. Neurologic:  Alert and oriented x3;  grossly normal neurologically. Skin:  Intact without significant lesions or rashes. No jaundice. Psych:  Alert and cooperative. Normal mood and affect.  Imaging Studies: Reviewed  Assessment and Plan:   Kristen [name] is a 40 y.o. pleasant Caucasian female with class I obesity s/p laparoscopic adjustable gastric band in August 2012 is seen in consultation for recent  episode of small bowel obstruction in 02/2021 requiring hospital admission which was thought to be secondary to adhesion based on the CT scan  Recommend colonoscopy with possible TI evaluation to evaluate for any underlying Crohn's Recommend MR enterography to evaluate for small bowel lesions Patient prefers to get these diagnostic tests done by end of this year as she met her deductible Trial of MiraLAX daily along with a soluble fiber such as Benefiber or Citrucel  Follow up after above work-up   Kristen Darby, MD

## 2021-05-02 NOTE — Addendum Note (Signed)
Addended by: Linward Foster on: 05/02/2021 03:40 PM   Modules accepted: Orders

## 2021-05-02 NOTE — Addendum Note (Signed)
Addended by: Linward Foster on: 05/02/2021 03:30 PM   Modules accepted: Orders

## 2021-05-02 NOTE — Progress Notes (Signed)
GAVE A CLENPIQ SAMPLE

## 2021-05-02 NOTE — Progress Notes (Signed)
Called patient she understands appointment and instructions

## 2021-05-02 NOTE — Progress Notes (Signed)
Got the mr enter appointment set for May 18 2021 at 300pm at [facility]  be there at 12:30 and nothing to eat or drink 4 hours prior to appointment at `1230

## 2021-05-04 ENCOUNTER — Encounter: Payer: Self-pay | Admitting: Gastroenterology

## 2021-05-07 ENCOUNTER — Encounter
Admission: RE | Disposition: A | Discharge status: Home / Self Care | Payer: Self-pay | Source: Home / Self Care | Attending: Gastroenterology

## 2021-05-07 ENCOUNTER — Other Ambulatory Visit: Payer: Self-pay | Admitting: Gastroenterology

## 2021-05-07 ENCOUNTER — Ambulatory Visit: Payer: BC Managed Care – PPO | Admitting: Certified Registered"

## 2021-05-07 ENCOUNTER — Encounter: Payer: Self-pay | Admitting: Gastroenterology

## 2021-05-07 ENCOUNTER — Ambulatory Visit
Admission: RE | Admit: 2021-05-07 | Discharge: 2021-05-07 | Disposition: A | Discharge status: Home / Self Care | Payer: BC Managed Care – PPO | Attending: Gastroenterology | Admitting: Gastroenterology

## 2021-05-07 DIAGNOSIS — Z8719 Personal history of other diseases of the digestive system: Secondary | ICD-10-CM

## 2021-05-07 DIAGNOSIS — R933 Abnormal findings on diagnostic imaging of other parts of digestive tract: Secondary | ICD-10-CM | POA: Insufficient documentation

## 2021-05-07 DIAGNOSIS — K219 Gastro-esophageal reflux disease without esophagitis: Secondary | ICD-10-CM | POA: Insufficient documentation

## 2021-05-07 HISTORY — PX: COLONOSCOPY WITH PROPOFOL: SHX5780

## 2021-05-07 LAB — POCT PREGNANCY, URINE: Preg Test, Ur: NEGATIVE

## 2021-05-07 SURGERY — COLONOSCOPY WITH PROPOFOL
Anesthesia: General

## 2021-05-07 MED ORDER — PROPOFOL 10 MG/ML IV BOLUS
INTRAVENOUS | Status: DC | PRN
  Administered 2021-05-07: 100 mg via INTRAVENOUS

## 2021-05-07 MED ORDER — GOLYTELY 236 G PO SOLR
4000.0000 mL | Freq: Once | ORAL | 0 refills | Status: AC
Start: 2021-05-07 — End: 2021-05-07

## 2021-05-07 MED ORDER — SODIUM CHLORIDE 0.9 % IV SOLN
INTRAVENOUS | Status: DC
  Administered 2021-05-07: 08:00:00 20 mL/h via INTRAVENOUS

## 2021-05-07 MED ORDER — PROPOFOL 500 MG/50ML IV EMUL
INTRAVENOUS | Status: DC | PRN
  Administered 2021-05-07: 130 ug/kg/min via INTRAVENOUS

## 2021-05-07 MED ORDER — PHENYLEPHRINE HCL (PRESSORS) 10 MG/ML IV SOLN
INTRAVENOUS | Status: AC
  Filled 2021-05-07: qty 1

## 2021-05-07 MED ORDER — PROPOFOL 500 MG/50ML IV EMUL
INTRAVENOUS | Status: AC
  Filled 2021-05-07: qty 100

## 2021-05-07 MED ORDER — LIDOCAINE HCL (CARDIAC) PF 100 MG/5ML IV SOSY
PREFILLED_SYRINGE | INTRAVENOUS | Status: DC | PRN
  Administered 2021-05-07: 100 mg via INTRAVENOUS

## 2021-05-07 MED ORDER — ONDANSETRON HCL 4 MG PO TABS: 4.0000 mg | ORAL_TABLET | Freq: Four times a day (QID) | ORAL | 0 refills | Status: DC | PRN

## 2021-05-07 MED ORDER — SODIUM CHLORIDE 0.9 % IV SOLN: INTRAVENOUS | Status: DC

## 2021-05-07 NOTE — Anesthesia Postprocedure Evaluation (Signed)
Anesthesia Post Note  Patient: Arlyce Dice  Procedure(s) Performed: COLONOSCOPY WITH PROPOFOL  Patient location during evaluation: Phase II Anesthesia Type: General Level of consciousness: awake and alert, awake and oriented Pain management: pain level controlled Vital Signs Assessment: post-procedure vital signs reviewed and stable Respiratory status: spontaneous breathing, nonlabored ventilation and respiratory function stable Cardiovascular status: blood pressure returned to baseline and stable Postop Assessment: no apparent nausea or vomiting Anesthetic complications: no   No notable events documented.   Last Vitals:  Vitals:   05/07/21 0850 05/07/21 0900  BP: 94/68 103/71  Pulse: 79 77  Resp: (!) 7 15  Temp:    SpO2: 100% 100%    Last Pain:  Vitals:   05/07/21 0830  TempSrc: Temporal  PainSc:                  Manfred Arch

## 2021-05-07 NOTE — Anesthesia Preprocedure Evaluation (Signed)
Anesthesia Evaluation  Patient identified by MRN, date of birth, ID band Patient awake    Reviewed: Allergy & Precautions, H&P , NPO status , Patient's Chart, lab work & pertinent test results, reviewed documented beta blocker date and time   Airway Mallampati: II  TM Distance: >3 FB Neck ROM: full    Dental  (+) Teeth Intact   Pulmonary neg pulmonary ROS,    Pulmonary exam normal        Cardiovascular Exercise Tolerance: Good negative cardio ROS Normal cardiovascular exam Rhythm:regular Rate:Normal     Neuro/Psych  Headaches, PSYCHIATRIC DISORDERS Anxiety Depression    GI/Hepatic Neg liver ROS, Bowel prep,GERD  Medicated,  Endo/Other  negative endocrine ROS  Renal/GU Renal disease  negative genitourinary   Musculoskeletal   Abdominal   Peds  Hematology negative hematology ROS (+)   Anesthesia Other Findings Agoraphobia with panic attacks    Anxiety    Depression    Dyslipidemia  not requiring meds  Hernia    Kidney stone    Migraine headache       Reproductive/Obstetrics negative OB ROS                            Anesthesia Physical  Anesthesia Plan  ASA: 2  Anesthesia Plan: General   Post-op Pain Management:    Induction: Intravenous  PONV Risk Score and Plan: 3 and Propofol infusion and TIVA  Airway Management Planned: Natural Airway and Nasal Cannula  Additional Equipment:   Intra-op Plan:   Post-operative Plan:   Informed Consent: I have reviewed the patients History and Physical, chart, labs and discussed the procedure including the risks, benefits and alternatives for the proposed anesthesia with the patient or authorized representative who has indicated his/her understanding and acceptance.     Dental Advisory Given  Plan Discussed with: CRNA, Anesthesiologist and Surgeon  Anesthesia Plan Comments:        Anesthesia Quick Evaluation

## 2021-05-07 NOTE — Op Note (Signed)
[facility] Gastroenterology Patient Name: Kristen [name] Procedure Date: 05/07/2021 8:23 AM MRN: [id number] Account #: [id number] Date of Birth: Nov 25, 1980 Admit Type: Outpatient Age: 40 Room: [facility] ENDO ROOM 1 Gender: Female Note Status: Finalized Instrument Name: Prentice Docker [id number] Procedure:             Colonoscopy Indications:           Abnormal CT of the GI tract Providers:             Toney Reil MD, MD Referring MD:          Demetrios Isaacs. Sherrie Mustache, MD (Referring MD) Medicines:             General Anesthesia Complications:         No immediate complications. Estimated blood loss: None. Procedure:             Pre-Anesthesia Assessment:                        - Prior to the procedure, a History and Physical was                         performed, and patient medications and allergies were                         reviewed. The patient is competent. The risks and                         benefits of the procedure and the sedation options and                         risks were discussed with the patient. All questions                         were answered and informed consent was obtained.                         Patient identification and proposed procedure were                         verified by the physician, the nurse, the                         anesthesiologist, the anesthetist and the technician                         in the pre-procedure area in the procedure room in the                         endoscopy suite. Mental Status Examination: alert and                         oriented. Airway Examination: normal oropharyngeal                         airway and neck mobility. Respiratory Examination:                         clear to auscultation. CV Examination: normal.  Prophylactic Antibiotics: The patient does not require                         prophylactic antibiotics. Prior Anticoagulants: The                         patient has  taken no previous anticoagulant or                         antiplatelet agents. ASA Grade Assessment: II - A                         patient with mild systemic disease. After reviewing                         the risks and benefits, the patient was deemed in                         satisfactory condition to undergo the procedure. The                         anesthesia plan was to use general anesthesia.                         Immediately prior to administration of medications,                         the patient was re-assessed for adequacy to receive                         sedatives. The heart rate, respiratory rate, oxygen                         saturations, blood pressure, adequacy of pulmonary                         ventilation, and response to care were monitored                         throughout the procedure. The physical status of the                         patient was re-assessed after the procedure.                        After obtaining informed consent, the colonoscope was                         passed under direct vision. Throughout the procedure,                         the patient's blood pressure, pulse, and oxygen                         saturations were monitored continuously. The                         Colonoscope was introduced through the anus with the  intention of advancing to the cecum. The scope was                         advanced to the rectum before the procedure was                         aborted. Medications were given. The colonoscopy was                         extremely difficult due to inadequate bowel prep.                         Successful completion of the procedure was aided by                         none. The patient tolerated the procedure well. The                         quality of the bowel preparation was poor. Findings:      The perianal and digital rectal examinations were normal. Pertinent       negatives  include normal sphincter tone and no palpable rectal lesions.      Extensive amounts of semi-solid stool was found in the entire colon,       precluding visualization. Procedure aborted Impression:            - Preparation of the colon was poor.                        - Stool in the entire examined colon.                        - No specimens collected. Recommendation:        - Discharge patient to home (with escort).                        - Clear liquid diet today.                        - Repeat colonoscopy tomorrow with repeat prep if                         patient is agreeable because the bowel preparation was                         poor. Procedure Code(s):     --- Professional ---                        [phone], 53, Colonoscopy, flexible; diagnostic,                         including collection of specimen(s) by brushing or                         washing, when performed (separate procedure) Diagnosis Code(s):     --- Professional ---                        R93.3, Abnormal findings on diagnostic imaging of  other parts of digestive tract CPT copyright 2019 American Medical Association. All rights reserved. The codes documented in this report are preliminary and upon coder review may  be revised to meet current compliance requirements. Dr. Libby Maw Toney Reil MD, MD 05/07/2021 8:36:54 AM This report has been signed electronically. Number of Addenda: 0 Note Initiated On: 05/07/2021 8:23 AM Total Procedure Duration: 0 hours 1 minute 19 seconds  Estimated Blood Loss:  Estimated blood loss: none.      [facility]

## 2021-05-07 NOTE — Brief Op Note (Signed)
Procedure aborted due to poor prep and visibility

## 2021-05-07 NOTE — H&P (Signed)
Kristen Repress, MD [address]  [address]  [place], [place] [address]  Main: [phone]  Fax: [phone] Pager: [phone]  Primary Care Physician:  Kristen Limes, MD Primary Gastroenterologist:  Dr. Arlyss [name]  Pre-Procedure History & Physical: HPI:  Kristen [name] is a 40 y.o. female is here for an colonoscopy.   Past Medical History:  Diagnosis Date   Agoraphobia with panic attacks    Anxiety    Depression    Dyslipidemia    not requiring meds   Hernia    Kidney stone    Migraine headache     Past Surgical History:  Procedure Laterality Date   CYSTOSCOPY/URETEROSCOPY/HOLMIUM LASER/STENT PLACEMENT Right 12/18/2020   Procedure: CYSTOSCOPY/URETEROSCOPY/HOLMIUM LASER/STENT PLACEMENT;  Surgeon: Kristen Scotland, MD;  Location: [facility] ORS;  Service: Urology;  Laterality: Right;   DIAGNOSTIC LAPAROSCOPY     HERNIA REPAIR  05/21/1983   INTRAUTERINE DEVICE INSERTION  01/27/2015   Mirena   LAPAROSCOPIC GASTRIC BANDING N/A 12/26/2012   Procedure: LAPAROSCOPIC GASTRIC BANDING REVISION ;  Surgeon: Kristen Ina, MD;  Location: [facility] ORS;  Service: General;  Laterality: N/A;   TONSILLECTOMY AND ADENOIDECTOMY  05/20/2005    Prior to Admission medications   Medication Sig Start Date End Date Taking? Authorizing Provider  ALPRAZolam Prudy Feeler) 1 MG tablet TAKE 1/2 TO 1 TABLET EVERY 4 HOURS AS NEEDED FOR      ANXIETY 03/26/21  Yes Kristen Limes, MD  amitriptyline (ELAVIL) 100 MG tablet TAKE 1 TABLET DAILY Patient taking differently: Take 100 mg by mouth at bedtime. 10/15/20  Yes Kristen Limes, MD  levonorgestrel (MIRENA) 20 MCG/24HR IUD 1 each by Intrauterine route once. Inserted 01/21/2020   Yes [provider]  pantoprazole (PROTONIX) 40 MG tablet Take 1 tablet (40 mg total) by mouth 2 (two) times daily. 03/08/21  Yes [name], Kristen Latif, DO  phentermine (ADIPEX-P) 37.5 MG tablet Take 37.5 mg by mouth daily. 02/23/21  Yes [provider]  sertraline  (ZOLOFT) 100 MG tablet Take 1.5 tablets (150 mg total) by mouth daily. Patient taking differently: Take 100 mg by mouth daily. 09/15/20 12/09/21 Yes Kristen Limes, MD  terbinafine (LAMISIL) 250 MG tablet Take 1 tablet (250 mg total) by mouth daily. 04/30/21 07/29/21 Yes McDonald, Rachelle Hora, DPM    Allergies as of 05/02/2021 - Review Complete 05/02/2021  Allergen Reaction Noted   Imitrex [sumatriptan] Nausea And Vomiting 09/29/2017    Family History  Problem Relation Age of Onset   Hypertension Father    Stroke Father    Hyperlipidemia Father    Migraines Mother    Cancer Mother        cervical   Hyperlipidemia Mother    Bipolar disorder Sister    Migraines Sister    Breast cancer Paternal Grandmother 62    Social History   Socioeconomic History   Marital status: Divorced    Spouse name: Not on file   Number of children: Not on file   Years of education: Not on file   Highest education level: Not on file  Occupational History   Not on file  Tobacco Use   Smoking status: Never   Smokeless tobacco: Never  Vaping Use   Vaping Use: Never used  Substance and Sexual Activity   Alcohol use: Not Currently    Alcohol/week: 0.0 standard drinks   Drug use: No   Sexual activity: Yes    Birth control/protection: I.U.D.  Other Topics Concern  Not on file  Social History Narrative   Not on file   Social Determinants of Health   Financial Resource Strain: Not on file  Food Insecurity: Not on file  Transportation Needs: Not on file  Physical Activity: Not on file  Stress: Not on file  Social Connections: Not on file  Intimate Partner Violence: Not on file    Review of Systems: See HPI, otherwise negative ROS  Physical Exam: BP 106/80    Pulse (!) 113    Temp 97.7 F (36.5 C) (Temporal)    Resp 20    Ht 5\' 6"  (1.676 m)    Wt 86.2 kg    SpO2 98%    BMI 30.67 kg/m  General:   Alert,  pleasant and cooperative in NAD Head:  Normocephalic and atraumatic. Neck:  Supple; no  masses or thyromegaly. Lungs:  Clear throughout to auscultation.    Heart:  Regular rate and rhythm. Abdomen:  Soft, nontender and nondistended. Normal bowel sounds, without guarding, and without rebound.   Neurologic:  Alert and  oriented x4;  grossly normal neurologically.  Impression/Plan: is here for an colonoscopy to be performed for h/o small bowel obstruction  Risks, benefits, limitations, and alternatives regarding  colonoscopy have been reviewed with the patient.  Questions have been answered.  All parties agreeable.   Arlyce Dice, MD  05/07/2021, 8:24 AM

## 2021-05-07 NOTE — Transfer of Care (Signed)
Immediate Anesthesia Transfer of Care Note  Patient: Kristen [name]  Procedure(s) Performed: COLONOSCOPY WITH PROPOFOL  Patient Location: PACU and Endoscopy Unit  Anesthesia Type:General  Level of Consciousness: drowsy  Airway & Oxygen Therapy: Patient Spontanous Breathing  Post-op Assessment: Report given to RN  Post vital signs: stable  Last Vitals:  Vitals Value Taken Time  BP    Temp    Pulse    Resp    SpO2      Last Pain:  Vitals:   05/07/21 0723  TempSrc: Temporal  PainSc: 0-No pain         Complications: No notable events documented.

## 2021-05-08 ENCOUNTER — Encounter
Admission: RE | Disposition: A | Discharge status: Home / Self Care | Payer: Self-pay | Source: Ambulatory Visit | Attending: Gastroenterology

## 2021-05-08 ENCOUNTER — Encounter: Payer: Self-pay | Admitting: Gastroenterology

## 2021-05-08 ENCOUNTER — Ambulatory Visit: Payer: BC Managed Care – PPO | Admitting: Certified Registered Nurse Anesthetist

## 2021-05-08 ENCOUNTER — Ambulatory Visit (HOSPITAL_BASED_OUTPATIENT_CLINIC_OR_DEPARTMENT_OTHER)
Admission: RE | Admit: 2021-05-08 | Discharge: 2021-05-08 | Disposition: A | Discharge status: Home / Self Care | Payer: BC Managed Care – PPO | Source: Ambulatory Visit | Attending: Gastroenterology | Admitting: Gastroenterology

## 2021-05-08 ENCOUNTER — Other Ambulatory Visit: Payer: Self-pay

## 2021-05-08 DIAGNOSIS — Z8719 Personal history of other diseases of the digestive system: Secondary | ICD-10-CM | POA: Insufficient documentation

## 2021-05-08 DIAGNOSIS — R933 Abnormal findings on diagnostic imaging of other parts of digestive tract: Secondary | ICD-10-CM | POA: Insufficient documentation

## 2021-05-08 HISTORY — PX: COLONOSCOPY WITH PROPOFOL: SHX5780

## 2021-05-08 SURGERY — COLONOSCOPY WITH PROPOFOL
Anesthesia: General

## 2021-05-08 MED ORDER — PROPOFOL 10 MG/ML IV BOLUS
INTRAVENOUS | Status: DC | PRN
  Administered 2021-05-08: 80 mg via INTRAVENOUS
  Administered 2021-05-08: 20 mg via INTRAVENOUS
  Administered 2021-05-08: 50 mg via INTRAVENOUS

## 2021-05-08 MED ORDER — PROPOFOL 500 MG/50ML IV EMUL
INTRAVENOUS | Status: DC | PRN
  Administered 2021-05-08: 160 ug/kg/min via INTRAVENOUS

## 2021-05-08 MED ORDER — SODIUM CHLORIDE 0.9 % IV SOLN: INTRAVENOUS | Status: DC

## 2021-05-08 MED ORDER — MIDAZOLAM HCL 2 MG/2ML IJ SOLN
INTRAMUSCULAR | Status: AC
  Filled 2021-05-08: qty 2

## 2021-05-08 MED ORDER — MIDAZOLAM HCL 2 MG/2ML IJ SOLN
INTRAMUSCULAR | Status: DC | PRN
Start: 2021-05-08 — End: 2021-05-08
  Administered 2021-05-08: 2 mg via INTRAVENOUS

## 2021-05-08 NOTE — Anesthesia Preprocedure Evaluation (Signed)
Anesthesia Evaluation  Patient identified by MRN, date of birth, ID band Patient awake    Airway Mallampati: II  TM Distance: >3 FB     Dental  (+) Teeth Intact   Pulmonary neg pulmonary ROS,    breath sounds clear to auscultation       Cardiovascular Exercise Tolerance: Good  Rhythm:Regular     Neuro/Psych Anxiety negative neurological ROS     GI/Hepatic negative GI ROS, Neg liver ROS,   Endo/Other  negative endocrine ROS  Renal/GU      Musculoskeletal negative musculoskeletal ROS (+)   Abdominal Normal abdominal exam  (+)   Peds negative pediatric ROS (+)  Hematology negative hematology ROS (+)   Anesthesia Other Findings   Reproductive/Obstetrics negative OB ROS                             Anesthesia Physical Anesthesia Plan  ASA: 2  Anesthesia Plan: General   Post-op Pain Management:    Induction: Intravenous  PONV Risk Score and Plan:   Airway Management Planned: Natural Airway and Nasal Cannula  Additional Equipment:   Intra-op Plan:   Post-operative Plan:   Informed Consent: I have reviewed the patients History and Physical, chart, labs and discussed the procedure including the risks, benefits and alternatives for the proposed anesthesia with the patient or authorized representative who has indicated his/her understanding and acceptance.       Plan Discussed with: CRNA and Surgeon  Anesthesia Plan Comments:         Anesthesia Quick Evaluation

## 2021-05-08 NOTE — Op Note (Signed)
[facility] Gastroenterology Patient Name: Kristen [name] Procedure Date: 05/08/2021 12:21 PM MRN: [id number] Account #: [id number] Date of Birth: October 22, 1980 Admit Type: Outpatient Age: 40 Room: [facility] ENDO ROOM 3 Gender: Female Note Status: Finalized Instrument Name: Colonscope [id number] Procedure:             Colonoscopy Indications:           This is the patient's first colonoscopy, , Abnormal CT                         of the GI tract, h/o small bowel obstruction Providers:             Toney Reil MD, MD Referring MD:          Demetrios Isaacs. Sherrie Mustache, MD (Referring MD) Medicines:             General Anesthesia Complications:         No immediate complications. Estimated blood loss: None. Procedure:             Pre-Anesthesia Assessment:                        - Prior to the procedure, a History and Physical was                         performed, and patient medications and allergies were                         reviewed. The patient is competent. The risks and                         benefits of the procedure and the sedation options and                         risks were discussed with the patient. All questions                         were answered and informed consent was obtained.                         Patient identification and proposed procedure were                         verified by the physician, the nurse, the                         anesthesiologist, the anesthetist and the technician                         in the pre-procedure area in the procedure room in the                         endoscopy suite. Mental Status Examination: alert and                         oriented. Airway Examination: normal oropharyngeal                         airway and neck mobility. Respiratory Examination:  clear to auscultation. CV Examination: normal.                         Prophylactic Antibiotics: The patient does not require                          prophylactic antibiotics. Prior Anticoagulants: The                         patient has taken no previous anticoagulant or                         antiplatelet agents. ASA Grade Assessment: II - A                         patient with mild systemic disease. After reviewing                         the risks and benefits, the patient was deemed in                         satisfactory condition to undergo the procedure. The                         anesthesia plan was to use general anesthesia.                         Immediately prior to administration of medications,                         the patient was re-assessed for adequacy to receive                         sedatives. The heart rate, respiratory rate, oxygen                         saturations, blood pressure, adequacy of pulmonary                         ventilation, and response to care were monitored                         throughout the procedure. The physical status of the                         patient was re-assessed after the procedure.                        After obtaining informed consent, the colonoscope was                         passed under direct vision. Throughout the procedure,                         the patient's blood pressure, pulse, and oxygen                         saturations were monitored continuously. The  Colonoscope was introduced through the anus and                         advanced to the 10 cm into the ileum. The colonoscopy                         was performed without difficulty. The patient                         tolerated the procedure well. The quality of the bowel                         preparation was fair. Findings:      The perianal and digital rectal examinations were normal. Pertinent       negatives include normal sphincter tone and no palpable rectal lesions.      The terminal ileum appeared normal.      The entire examined colon appeared normal.      The  retroflexed view of the distal rectum and anal verge was normal and       showed no anal or rectal abnormalities. Impression:            - Preparation of the colon was fair.                        - The examined portion of the ileum was normal.                        - The entire examined colon is normal.                        - The distal rectum and anal verge are normal on                         retroflexion view.                        - No specimens collected. Recommendation:        - Discharge patient to home (with escort).                        - Resume previous diet today.                        - Continue present medications. Procedure Code(s):     --- Professional ---                        [phone], Colonoscopy, flexible; diagnostic, including                         collection of specimen(s) by brushing or washing, when                         performed (separate procedure) Diagnosis Code(s):     --- Professional ---                        R93.3, Abnormal findings on diagnostic imaging of  other parts of digestive tract CPT copyright 2019 American Medical Association. All rights reserved. The codes documented in this report are preliminary and upon coder review may  be revised to meet current compliance requirements. Dr. Libby Maw Toney Reil MD, MD 05/08/2021 12:41:23 PM This report has been signed electronically. Number of Addenda: 0 Note Initiated On: 05/08/2021 12:21 PM Scope Withdrawal Time: 0 hours 5 minutes 46 seconds  Total Procedure Duration: 0 hours 12 minutes 56 seconds  Estimated Blood Loss:  Estimated blood loss: none.      [facility]

## 2021-05-08 NOTE — Anesthesia Postprocedure Evaluation (Signed)
Anesthesia Post Note  Patient: Kristen [name]  Procedure(s) Performed: COLONOSCOPY WITH PROPOFOL  Patient location during evaluation: PACU Anesthesia Type: General Level of consciousness: awake and awake and alert Pain management: satisfactory to patient Vital Signs Assessment: post-procedure vital signs reviewed and stable Respiratory status: respiratory function stable Cardiovascular status: stable Anesthetic complications: no   No notable events documented.   Last Vitals:  Vitals:   05/08/21 1242 05/08/21 1252  BP: 98/74 (!) 86/60  Pulse: 90   Resp: (!) 22   Temp: (!) 36 C   SpO2: 98%     Last Pain:  Vitals:   05/08/21 1242  TempSrc: Temporal  PainSc: Asleep                 VAN STAVEREN,Areona Homer

## 2021-05-08 NOTE — H&P (Signed)
Kristen Repress, MD [address]  [address]  [place], [place] [address]  Main: [phone]  Fax: [phone] Pager: [phone]  Primary Care Physician:  [name], MD Primary Gastroenterologist:  Dr. Arlyss [name]  Pre-Procedure History & Physical: HPI:  Kristen [name] is a 40 y.o. female is here for an colonoscopy.   Past Medical History:  Diagnosis Date   Agoraphobia with panic attacks    Anxiety    Depression    Dyslipidemia    not requiring meds   Hernia    Kidney stone    Migraine headache     Past Surgical History:  Procedure Laterality Date   COLONOSCOPY WITH PROPOFOL N/A 05/07/2021   Procedure: COLONOSCOPY WITH PROPOFOL;  Surgeon: Toney Reil, MD;  Location: [facility] ENDOSCOPY;  Service: Gastroenterology;  Laterality: N/A;   CYSTOSCOPY/URETEROSCOPY/HOLMIUM LASER/STENT PLACEMENT Right 12/18/2020   Procedure: CYSTOSCOPY/URETEROSCOPY/HOLMIUM LASER/STENT PLACEMENT;  Surgeon: Vanna Scotland, MD;  Location: [facility] ORS;  Service: Urology;  Laterality: Right;   DIAGNOSTIC LAPAROSCOPY     HERNIA REPAIR  05/21/1983   INTRAUTERINE DEVICE INSERTION  01/27/2015   Mirena   LAPAROSCOPIC GASTRIC BANDING N/A 12/26/2012   Procedure: LAPAROSCOPIC GASTRIC BANDING REVISION ;  Surgeon: Atilano Ina, MD;  Location: [facility] ORS;  Service: General;  Laterality: N/A;   TONSILLECTOMY AND ADENOIDECTOMY  05/20/2005    Prior to Admission medications   Medication Sig Start Date End Date Taking? Authorizing Provider  amitriptyline (ELAVIL) 100 MG tablet TAKE 1 TABLET DAILY Patient taking differently: Take 100 mg by mouth at bedtime. 10/15/20  Yes [name], MD  levonorgestrel (MIRENA) 20 MCG/24HR IUD 1 each by Intrauterine route once. Inserted 01/21/2020   Yes [provider]  ondansetron (ZOFRAN) 4 MG tablet Take 1 tablet (4 mg total) by mouth every 6 (six) hours as needed for up to 30 doses for nausea or vomiting. 05/07/21  Yes Gardy Montanari, Loel Dubonnet, MD  pantoprazole  (PROTONIX) 40 MG tablet Take 1 tablet (40 mg total) by mouth 2 (two) times daily. 03/08/21  Yes Sheikh, Omair Latif, DO  phentermine (ADIPEX-P) 37.5 MG tablet Take 37.5 mg by mouth daily. 02/23/21  Yes [provider]  sertraline (ZOLOFT) 100 MG tablet Take 1.5 tablets (150 mg total) by mouth daily. Patient taking differently: Take 100 mg by mouth daily. 09/15/20 12/09/21 Yes [name], MD  terbinafine (LAMISIL) 250 MG tablet Take 1 tablet (250 mg total) by mouth daily. 04/30/21 07/29/21 Yes McDonald, Rachelle Hora, DPM  ALPRAZolam Prudy Feeler) 1 MG tablet TAKE 1/2 TO 1 TABLET EVERY 4 HOURS AS NEEDED FOR      ANXIETY 03/26/21   [name], MD    Allergies as of 05/07/2021 - Review Complete 05/07/2021  Allergen Reaction Noted   Imitrex [sumatriptan] Nausea And Vomiting 09/29/2017    Family History  Problem Relation Age of Onset   Hypertension Father    Stroke Father    Hyperlipidemia Father    Migraines Mother    Cancer Mother        cervical   Hyperlipidemia Mother    Bipolar disorder Sister    Migraines Sister    Breast cancer Paternal Grandmother 27    Social History   Socioeconomic History   Marital status: Divorced    Spouse name: Not on file   Number of children: Not on file   Years of education: Not on file   Highest education level: Not on file  Occupational History  Not on file  Tobacco Use   Smoking status: Never   Smokeless tobacco: Never  Vaping Use   Vaping Use: Never used  Substance and Sexual Activity   Alcohol use: Not Currently    Alcohol/week: 0.0 standard drinks   Drug use: No   Sexual activity: Yes    Birth control/protection: I.U.D.  Other Topics Concern   Not on file  Social History Narrative   Not on file   Social Determinants of Health   Financial Resource Strain: Not on file  Food Insecurity: Not on file  Transportation Needs: Not on file  Physical Activity: Not on file  Stress: Not on file  Social Connections: Not on file   Intimate Partner Violence: Not on file    Review of Systems: See HPI, otherwise negative ROS  Physical Exam: BP 112/78    Pulse (!) 103    Temp 97.8 F (36.6 C) (Temporal)    Resp 16    Ht 5\' 6"  (1.676 m)    Wt 84.4 kg    SpO2 98%    BMI 30.02 kg/m  General:   Alert,  pleasant and cooperative in NAD Head:  Normocephalic and atraumatic. Neck:  Supple; no masses or thyromegaly. Lungs:  Clear throughout to auscultation.    Heart:  Regular rate and rhythm. Abdomen:  Soft, nontender and nondistended. Normal bowel sounds, without guarding, and without rebound.   Neurologic:  Alert and  oriented x4;  grossly normal neurologically.  Impression/Plan: is here for an colonoscopy to be performed for h/o SBO  Risks, benefits, limitations, and alternatives regarding  colonoscopy have been reviewed with the patient.  Questions have been answered.  All parties agreeable.   Gaylan Gerold, MD  05/08/2021, 12:07 PM

## 2021-05-08 NOTE — Transfer of Care (Signed)
Immediate Anesthesia Transfer of Care Note  Patient: Kristen [name]  Procedure(s) Performed: COLONOSCOPY WITH PROPOFOL  Patient Location: PACU  Anesthesia Type:General  Level of Consciousness: drowsy  Airway & Oxygen Therapy: Patient Spontanous Breathing and Patient connected to nasal cannula oxygen  Post-op Assessment: Report given to RN and Post -op Vital signs reviewed and stable  Post vital signs: Reviewed and stable  Last Vitals:  Vitals Value Taken Time  BP    Temp    Pulse 90 05/08/21 1242  Resp 23 05/08/21 1242  SpO2 98 % 05/08/21 1242  Vitals shown include unvalidated device data.  Last Pain:  Vitals:   05/08/21 1142  TempSrc: Temporal  PainSc: 0-No pain         Complications: No notable events documented.

## 2021-05-09 ENCOUNTER — Encounter: Payer: Self-pay | Admitting: Gastroenterology

## 2021-05-18 ENCOUNTER — Ambulatory Visit
Admission: RE | Admit: 2021-05-18 | Discharge: 2021-05-18 | Disposition: A | Discharge status: Home / Self Care | Payer: BC Managed Care – PPO | Source: Ambulatory Visit | Attending: Gastroenterology | Admitting: Gastroenterology

## 2021-05-18 ENCOUNTER — Other Ambulatory Visit: Payer: Self-pay

## 2021-05-18 DIAGNOSIS — Z8719 Personal history of other diseases of the digestive system: Secondary | ICD-10-CM | POA: Insufficient documentation

## 2021-05-18 MED ORDER — GADOBUTROL 1 MMOL/ML IV SOLN
8.0000 mL | Freq: Once | INTRAVENOUS | Status: AC | PRN
  Administered 2021-05-18: 16:00:00 8 mL via INTRAVENOUS

## 2021-05-22 ENCOUNTER — Encounter: Payer: Self-pay | Admitting: Gastroenterology

## 2021-05-22 ENCOUNTER — Other Ambulatory Visit: Payer: Self-pay

## 2021-05-22 ENCOUNTER — Encounter: Payer: Self-pay | Admitting: Dermatology

## 2021-05-22 ENCOUNTER — Ambulatory Visit: Payer: BC Managed Care – PPO | Admitting: Dermatology

## 2021-05-22 DIAGNOSIS — L578 Other skin changes due to chronic exposure to nonionizing radiation: Secondary | ICD-10-CM

## 2021-05-22 DIAGNOSIS — L988 Other specified disorders of the skin and subcutaneous tissue: Secondary | ICD-10-CM | POA: Diagnosis not present

## 2021-05-22 DIAGNOSIS — L811 Chloasma: Secondary | ICD-10-CM | POA: Diagnosis not present

## 2021-05-22 MED ORDER — TRETINOIN 0.1 % EX CREA: TOPICAL_CREAM | Freq: Every day | CUTANEOUS | 11 refills | Status: AC

## 2021-05-22 NOTE — Progress Notes (Signed)
° °  Follow-Up Visit   Subjective  Kristen [name] is a 41 y.o. female who presents for the following: Other (She is using OTC Differin for acne which is clear but would like something a little stronger.).  The following portions of the chart were reviewed this encounter and updated as appropriate:   Tobacco   Allergies   Meds   Problems   Med Hx   Surg Hx   Fam Hx      Review of Systems:  No other skin or systemic complaints except as noted in HPI or Assessment and Plan.  Objective  Well appearing patient in no apparent distress; mood and affect are within normal limits.  A focused examination was performed including face. Relevant physical exam findings are noted in the Assessment and Plan.  Face            Assessment & Plan  Elastosis of skin with fine wrinkling especially infraorbital with Actinic changes with some Melasma Face Start Tretinoin 0.1% gel qhs - advised patient she may mix with a moisturizer prior to applying if it is too irritating.  Advised patient to call if too irritating and we may consider Skin medic[employer] compounded cream tretinoin 0.05% with niacinamide and Hyaluronic acid.  May also consider [employer] Tretinoin with Hydroquinone.  Recommend sunscreen - EltaMD Consider Heliocare.  Melasma is a chronic condition of persistent pigmented patches generally on the face, worse in summer due to higher UV exposure.  Oral estrogen containing BCPs or supplements can exacerbate condition.  Recommend daily broad spectrum tinted sunscreen SPF 30+ to face, preferably with Zinc or Titanium Dioxide. Discussed Rx topical bleaching creams (i.e. hydroquinone), OTC HelioCare supplement, chemical peels (would need multiple for best result).   tretinoin (RETIN-A) 0.1 % cream - Face Apply topically at bedtime.  Actinic Damage - chronic, secondary to cumulative UV radiation exposure/sun exposure over time - diffuse scaly erythematous macules with underlying  dyspigmentation - Recommend daily broad spectrum sunscreen SPF 30+ to sun-exposed areas, reapply every 2 hours as needed.  - Recommend staying in the shade or wearing long sleeves, sun glasses (UVA+UVB protection) and wide brim hats (4-inch brim around the entire circumference of the hat). - Call for new or changing lesions.  Return if symptoms worsen or fail to improve.  I, Joanie Coddington, CMA, am acting as scribe for Armida Sans, MD . Documentation: I have reviewed the above documentation for accuracy and completeness, and I agree with the above.  Armida Sans, MD

## 2021-05-22 NOTE — Patient Instructions (Signed)

## 2021-05-24 ENCOUNTER — Ambulatory Visit: Payer: BC Managed Care – PPO | Admitting: Gastroenterology

## 2021-05-24 ENCOUNTER — Other Ambulatory Visit: Payer: Self-pay

## 2021-07-23 ENCOUNTER — Other Ambulatory Visit: Payer: Self-pay

## 2021-07-23 DIAGNOSIS — N201 Calculus of ureter: Secondary | ICD-10-CM

## 2021-07-24 NOTE — Progress Notes (Signed)
? ?07/25/2021 ?8:16 AM  ? ?Kristen [name] ?10-05-1980 ?[id number] ? ?Referring provider:  ?[name], MD ?[address] ?[address] ?[place],  [place] [address] ?Chief Complaint  ?Patient presents with  ? Nephrolithiasis  ? ? ? 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  ? ?She is s/p uncomplicated ureteroscopy on 12/18/2020. Stone analysis was 40% calcium oxalate monohydrate, 50% calcium oxalate dihydrate, and 10% hydroxyapatite.  ? ?KUB was personally reviewed today.  Possible small stone on the right, otherwise no significant residual stone burden. ? ?She is doing well today.  She had no flank pain or other stone episodes. ? ?She is very anxious about making stones.  Has been better about drinking water. ? ?PMH: ?Past Medical History:  ?Diagnosis Date  ? Agoraphobia with panic attacks   ? Anxiety   ? Depression   ? Dyslipidemia   ? not requiring meds  ? Hernia   ? Kidney stone   ? Migraine headache   ? ? ?Surgical History: ?Past Surgical History:  ?Procedure Laterality Date  ? COLONOSCOPY WITH PROPOFOL N/A 05/07/2021  ? Procedure: COLONOSCOPY WITH PROPOFOL;  Surgeon: Toney Reil, MD;  Location: [facility] ENDOSCOPY;  Service: Gastroenterology;  Laterality: N/A;  ? COLONOSCOPY WITH PROPOFOL N/A 05/08/2021  ? Procedure: COLONOSCOPY WITH PROPOFOL;  Surgeon: Toney Reil, MD;  Location: [facility] ENDOSCOPY;  Service: Gastroenterology;  Laterality: N/A;  ? CYSTOSCOPY/URETEROSCOPY/HOLMIUM LASER/STENT PLACEMENT Right 12/18/2020  ? Procedure: CYSTOSCOPY/URETEROSCOPY/HOLMIUM LASER/STENT PLACEMENT;  Surgeon: Vanna Scotland, MD;  Location: [facility] ORS;  Service: Urology;  Laterality: Right;  ? DIAGNOSTIC LAPAROSCOPY    ? HERNIA REPAIR  05/21/1983  ? INTRAUTERINE DEVICE INSERTION  01/27/2015  ? Mirena  ? LAPAROSCOPIC GASTRIC BANDING N/A 12/26/2012  ? Procedure: LAPAROSCOPIC  GASTRIC BANDING REVISION ;  Surgeon: Atilano Ina, MD;  Location: [facility] ORS;  Service: General;  Laterality: N/A;  ? TONSILLECTOMY AND ADENOIDECTOMY  05/20/2005  ? ? ?Home Medications:  ?Allergies as of 07/25/2021   ? ?   Reactions  ? Imitrex [sumatriptan] Nausea And Vomiting  ? ?  ? ?  ?Medication List  ?  ? ?  ? Accurate as of July 25, 2021 11:59 PM. If you have any questions, ask your nurse or doctor.  ?  ?  ? ?  ? ?ALPRAZolam 1 MG tablet ?Commonly known as: Prudy Feeler ?TAKE 1/2 TO 1 TABLET EVERY 4 HOURS AS NEEDED FOR      ANXIETY ?  ?amitriptyline 100 MG tablet ?Commonly known as: ELAVIL ?TAKE 1 TABLET DAILY ?What changed: when to take this ?  ?levonorgestrel 20 MCG/24HR IUD ?Commonly known as: MIRENA ?1 each by Intrauterine route once. Inserted 01/21/2020 ?  ?ondansetron 4 MG tablet ?Commonly known as: Zofran ?Take 1 tablet (4 mg total) by mouth every 6 (six) hours as needed for up to 30 doses for nausea or vomiting. ?  ?pantoprazole 40 MG tablet ?Commonly known as: PROTONIX ?Take 1 tablet (40 mg total) by mouth 2 (two) times daily. ?  ?phentermine 37.5 MG tablet ?Commonly known as: ADIPEX-P ?Take 37.5 mg by mouth daily. ?  ?sertraline 100 MG tablet ?Commonly known as: ZOLOFT ?Take 1.5 tablets (150 mg total) by mouth daily. ?What changed: how much to take ?  ?terbinafine 250 MG tablet ?Commonly known as: LAMISIL ?Take 1 tablet (250 mg total) by  mouth daily. ?  ?tretinoin 0.1 % cream ?Commonly known as: RETIN-A ?Apply topically at bedtime. ?  ? ?  ? ? ?Allergies:  ?Allergies  ?Allergen Reactions  ? Imitrex [Sumatriptan] Nausea And Vomiting  ? ? ?Family History: ?Family History  ?Problem Relation Age of Onset  ? Hypertension Father   ? Stroke Father   ? Hyperlipidemia Father   ? Migraines Mother   ? Cancer Mother   ?     cervical  ? Hyperlipidemia Mother   ? Bipolar disorder Sister   ? Migraines Sister   ? Breast cancer Paternal Grandmother 43  ? ? ?Social History:  reports that she has never smoked. She has never used  smokeless tobacco. She reports that she does not currently use alcohol. She reports that she does not use drugs. ? ? ?Physical Exam: ?BP 106/74   Pulse (!) 106   Ht 5\' 6"  (1.676 m)   Wt 189 lb (85.7 kg)   BMI 30.51 kg/m?   ?Constitutional:  Alert and oriented, No acute distress. ?HEENT:  AT, moist mucus membranes.  Trachea midline, no masses. ?Cardiovascular: No clubbing, cyanosis, or edema. ?Respiratory: Normal respiratory effort, no increased work of breathing. ?Skin: No rashes, bruises or suspicious lesions. ?Neurologic: Grossly intact, no focal deficits, moving all 4 extremities. ?Psychiatric: Normal mood and affect. ? ?Laboratory Data: ?Lab Results  ?Component Value Date  ? CREATININE 0.58 03/26/2021  ? ? ?Pertinent Imaging: ?KUB was personally reviewed today and visualized a possible punctate stone in right upper pole otherwise no significant stone burden.  Awaiting final radiologic interpretation. ? ?Assessment & Plan: ?History of kidney stones ?- S/p right ureteroscopy with laser lithotripsy  ?- KUB was reassuring  ?- Will continue to monitor with KUB in a year.  ?-Reminded of stone prevention techniques primarily increase water intake ? ?Return in 1 year for KUB ? ?13/11/2020 as a Tawni Millers for Neurosurgeon, MD.,have documented all relevant documentation on the behalf of Vanna Scotland, MD,as directed by  Vanna Scotland, MD while in the presence of Vanna Scotland, MD. ? ?I have reviewed the above documentation for accuracy and completeness, and I agree with the above.  ? ?Vanna Scotland, MD ? ? ?[facility] ?[address], [address] ?[place], [place] ?[phone] ?

## 2021-07-25 ENCOUNTER — Ambulatory Visit
Admission: RE | Admit: 2021-07-25 | Discharge: 2021-07-25 | Disposition: A | Discharge status: Home / Self Care | Payer: BC Managed Care – PPO | Source: Ambulatory Visit | Attending: Urology | Admitting: Urology

## 2021-07-25 ENCOUNTER — Encounter: Payer: Self-pay | Admitting: Urology

## 2021-07-25 ENCOUNTER — Other Ambulatory Visit: Payer: Self-pay

## 2021-07-25 ENCOUNTER — Ambulatory Visit: Payer: BC Managed Care – PPO | Admitting: Urology

## 2021-07-25 ENCOUNTER — Ambulatory Visit
Admission: RE | Admit: 2021-07-25 | Discharge: 2021-07-25 | Disposition: A | Discharge status: Home / Self Care | Payer: BC Managed Care – PPO | Attending: Urology | Admitting: Urology

## 2021-07-25 VITALS — BP 106/74 | HR 106 | Ht 66.0 in | Wt 189.0 lb

## 2021-07-25 DIAGNOSIS — N200 Calculus of kidney: Secondary | ICD-10-CM

## 2021-07-25 DIAGNOSIS — N201 Calculus of ureter: Secondary | ICD-10-CM

## 2021-08-06 ENCOUNTER — Ambulatory Visit: Payer: BC Managed Care – PPO | Admitting: Podiatry

## 2021-08-06 ENCOUNTER — Other Ambulatory Visit: Payer: Self-pay

## 2021-08-06 ENCOUNTER — Encounter: Payer: Self-pay | Admitting: Podiatry

## 2021-08-06 DIAGNOSIS — B351 Tinea unguium: Secondary | ICD-10-CM | POA: Diagnosis not present

## 2021-08-06 MED ORDER — FLUCONAZOLE 150 MG PO TABS: 150.0000 mg | ORAL_TABLET | ORAL | 0 refills | Status: DC

## 2021-08-06 NOTE — Progress Notes (Signed)
?  Subjective:  ?Patient ID: Kristen [name], female    DOB: 1980/09/02,  MRN: [id number] ? ?Chief Complaint  ?Patient presents with  ? Nail Problem  ?  "I can notice a small diffrence"  ? ? ?41 y.o. female returns for follow-up with the above complaint. History confirmed with patient.  There is slight improvement still feels closely about the same to her ? ?Objective:  ?Physical Exam: ?warm, good capillary refill, no trophic changes or ulcerative lesions, normal DP and PT pulses and normal sensory exam.  Superficial white onychomycosis 1 through 4 bilaterally with hallux being the worst, slightly improved since last visit still has some superficial white and yellow discoloration ? ? ? ? ? ? ? ? ? ? ? ?Assessment:  ? ?1. Onychomycosis   ? ? ? ?Plan:  ?Patient was evaluated and treated and all questions answered. ? ?We again discussed further treatment options.  New photographs were taken.  Discussed the option of laser treatment which I think is probably cost prohibitive at this point.  I recommended switching from terbinafine to oral Diflucan weekly, possible that the infectious agent is something that would be more responsive to this such as a yeast or mold species.  I we will see her back in 6 months after this ? ? ? ? ?Return in about 6 months (around 02/06/2022) for follow up after nail fungus treatment.  ? ?

## 2021-08-28 ENCOUNTER — Other Ambulatory Visit: Payer: Self-pay | Admitting: Family Medicine

## 2021-08-28 DIAGNOSIS — F410 Panic disorder [episodic paroxysmal anxiety] without agoraphobia: Secondary | ICD-10-CM

## 2021-08-28 DIAGNOSIS — F419 Anxiety disorder, unspecified: Secondary | ICD-10-CM

## 2021-08-30 ENCOUNTER — Ambulatory Visit: Payer: BC Managed Care – PPO | Admitting: Gastroenterology

## 2021-08-30 ENCOUNTER — Encounter: Payer: Self-pay | Admitting: Gastroenterology

## 2021-08-30 ENCOUNTER — Other Ambulatory Visit: Payer: Self-pay

## 2021-08-30 VITALS — BP 120/81 | HR 102 | Temp 98.2°F | Ht 66.0 in | Wt 199.5 lb

## 2021-08-30 DIAGNOSIS — K219 Gastro-esophageal reflux disease without esophagitis: Secondary | ICD-10-CM | POA: Diagnosis not present

## 2021-08-30 DIAGNOSIS — Z9884 Bariatric surgery status: Secondary | ICD-10-CM

## 2021-08-30 DIAGNOSIS — K5909 Other constipation: Secondary | ICD-10-CM

## 2021-08-30 DIAGNOSIS — Z8719 Personal history of other diseases of the digestive system: Secondary | ICD-10-CM | POA: Diagnosis not present

## 2021-08-30 MED ORDER — PANTOPRAZOLE SODIUM 40 MG PO TBEC: 40.0000 mg | DELAYED_RELEASE_TABLET | Freq: Every day | ORAL | 3 refills | Status: DC

## 2021-08-30 NOTE — Patient Instructions (Signed)
Gave Ibguard samples  ?

## 2021-08-30 NOTE — Progress Notes (Signed)
?  ?Kristen Repressohini R Joandy Burget, MD ?[address]  ?[address]  ?[place], [place] [address]  ?Main: [phone]  ?Fax: [phone] ? ? ? ?Gastroenterology Consultation ? ?Referring Provider:     Malva LimesFisher, Donald E, MD ?Primary Care Physician:  Malva LimesFisher, Donald E, MD ?Primary Gastroenterologist:  Dr. Arlyss Repressohini R Sanaii Caporaso ?Reason for Consultation:     History of small bowel obstruction, chronic constipation ?      ? HPI:   ?Kristen [name] is a 41 y.o. female referred by Dr. Malva LimesFisher, Donald E, MD  for consultation & management of hospital admission for small bowel obstruction in 02/2021.  Patient has history of laparoscopic adjustable gastric band in August 2012 by Dr. Andrey CampanileWilson.  Patient has lost about 100 pounds since the Lap-Band.  Small bowel obstruction based on the CT scan was likely due to adhesions.  Patient was managed conservatively with NG tube placement, her symptoms resolved and she was slowly advance to soft diet at the time of discharge. ? ?Patient had a follow-up with her bariatric surgeon who did not think this episode was related to Lap-Band.  Patient reports that since she was discharged from the hospital, she has been having regular bowel movements, 2-3 times a week which are hard.  She tried MiraLAX for 2 to 3 days after she was released from the hospital which did not help. She continues to have nausea, without vomiting, stays bloated all day long and states it is worse after eating. She denies any rectal bleeding.  No evidence of anemia. ? ?Follow-up visit 08/30/2021 ?Patient is here for follow-up of constipation.  She underwent colonoscopy after 2-day prep, there was no evidence of inflammation or a stricture including TI evaluation.  MR enterography was also normal.  Her Lap-Band is now deflated and she was experiencing significant upper GI symptoms.  She reports that she has been eating large amount of fiber including dietary fiber as well as Bene fiber.  She gained more than 10 pounds since deflation of lap band but  overall feeling significantly better.  She reports having 1 formed bowel movement daily.  Her only concern today is mild left-sided abdominal bloating.  She does admit to snacking on chips daily.  She denies any carbonated beverages other than lemonade Crystallite and water.  She switched PPI to Protonix 40 mg once a day in the morning for reflux which she is helping with heartburn.  She is requesting new prescription. ? ? ?She denies tobacco use, marijuana use, alcohol use ?She denies any opioid use ? ?NSAIDs: None ? ?Antiplts/Anticoagulants/Anti thrombotics: None ? ?GI Procedures: None ?She denies any family history of IBD ? ?Past Medical History:  ?Diagnosis Date  ? Agoraphobia with panic attacks   ? Anxiety   ? Depression   ? Dyslipidemia   ? not requiring meds  ? Hernia   ? Kidney stone   ? Migraine headache   ? ? ?Past Surgical History:  ?Procedure Laterality Date  ? COLONOSCOPY WITH PROPOFOL N/A 05/07/2021  ? Procedure: COLONOSCOPY WITH PROPOFOL;  Surgeon: Toney ReilVanga, Mairany Bruno Reddy, MD;  Location: [facility] ENDOSCOPY;  Service: Gastroenterology;  Laterality: N/A;  ? COLONOSCOPY WITH PROPOFOL N/A 05/08/2021  ? Procedure: COLONOSCOPY WITH PROPOFOL;  Surgeon: Toney ReilVanga, Nyanna Heideman Reddy, MD;  [facility] END[facility]COPY;  Service: Gastroenterology;  Laterality: N/A;  ? CYSTOSCOPY/URETEROSCOPY/HOLMIUM LASER/STENT PLACEMENT Right 12/18/2020  ? Procedure: CYSTOSCOPY/URETEROSCOPY/HOLMIUM LASER/STENT PLACEMENT;  Surgeon: Vanna ScotlandBrandon, Ashley, MD;  Location: [facility] ORS;  Service: Urology;  Laterality: Right;  ? DIAGNOSTIC LAPAROSCOPY    ?  HERNIA REPAIR  05/21/1983  ? INTRAUTERINE DEVICE INSERTION  01/27/2015  ? Mirena  ? LAPAROSCOPIC GASTRIC BANDING N/A 12/26/2012  ? Procedure: LAPAROSCOPIC GASTRIC BANDING REVISION ;  Surgeon: Atilano Ina, MD;  Location: [facility] ORS;  Service: General;  Laterality: N/A;  ? TONSILLECTOMY AND ADENOIDECTOMY  05/20/2005  ? ?Current Outpatient Medications:  ?  ALPRAZolam (XANAX) 1 MG tablet, TAKE 1/2 TO 1 TABLET EVERY 4  HOURS AS NEEDED FOR      ANXIETY, Disp: 90 tablet, Rfl: 1 ?  amitriptyline (ELAVIL) 100 MG tablet, TAKE 1 TABLET DAILY (Patient taking differently: Take 100 mg by mouth at bedtime.), Disp: 90 tablet, Rfl: 3 ?  fluconazole (DIFLUCAN) 150 MG tablet, Take 1 tablet (150 mg total) by mouth once a week., Disp: 26 tablet, Rfl: 0 ?  levonorgestrel (MIRENA) 20 MCG/24HR IUD, 1 each by Intrauterine route once. Inserted 01/21/2020, Disp: , Rfl:  ?  pantoprazole (PROTONIX) 40 MG tablet, Take 1 tablet (40 mg total) by mouth daily before breakfast., Disp: 90 tablet, Rfl: 3 ?  sertraline (ZOLOFT) 100 MG tablet, TAKE 1 AND 1/2 TABLETS(150 MG TOTAL) DAILY, Disp: 135 tablet, Rfl: 0 ?  tretinoin (RETIN-A) 0.1 % cream, Apply topically at bedtime., Disp: 45 g, Rfl: 11 ? ?Family History  ?Problem Relation Age of Onset  ? Hypertension Father   ? Stroke Father   ? Hyperlipidemia Father   ? Migraines Mother   ? Cancer Mother   ?     cervical  ? Hyperlipidemia Mother   ? Bipolar disorder Sister   ? Migraines Sister   ? Breast cancer Paternal Grandmother 61  ?  ? ?Social History  ? ?Tobacco Use  ? Smoking status: Never  ? Smokeless tobacco: Never  ?Vaping Use  ? Vaping Use: Never used  ?Substance Use Topics  ? Alcohol use: Not Currently  ?  Alcohol/week: 0.0 standard drinks  ? Drug use: No  ? ? ?Allergies as of 08/30/2021 - Review Complete 08/30/2021  ?Allergen Reaction Noted  ? Imitrex [sumatriptan] Nausea And Vomiting 09/29/2017  ? ? ?Review of Systems:    ?All systems reviewed and negative except where noted in HPI. ? ? Physical Exam:  ?BP 120/81 (BP Location: Left Arm, Patient Position: Sitting, Cuff Size: Normal)   Pulse (!) 102   Temp 98.2 ?F (36.8 ?C) (Oral)   Ht 5\' 6"  (1.676 m)   Wt 199 lb 8 oz (90.5 kg)   BMI 32.20 kg/m?  ?No LMP recorded. (Menstrual status: IUD). ? ?General:   Alert,  Well-developed, well-nourished, pleasant and cooperative in NAD ?Head:  Normocephalic and atraumatic. ?Eyes:  Sclera clear, no icterus.    Conjunctiva pink. ?Ears:  Normal auditory acuity. ?Nose:  No deformity, discharge, or lesions. ?Mouth:  No deformity or lesions,oropharynx pink & moist. ?Neck:  Supple; no masses or thyromegaly. ?Lungs:  Respirations even and unlabored.  Clear throughout to auscultation.   No wheezes, crackles, or rhonchi. No acute distress. ?Heart:  Regular rate and rhythm; no murmurs, clicks, rubs, or gallops. ?Abdomen:  Normal bowel sounds. Soft, palpable Lap-Band port in the right upper quadrant, left lower quadrant tenderness and non-distended without masses, hepatosplenomegaly or hernias noted.  No guarding or rebound tenderness.   ?Rectal: Not performed ?Msk:  Symmetrical without gross deformities. Good, equal movement & strength bilaterally. ?Pulses:  Normal pulses noted. ?Extremities:  No clubbing or edema.  No cyanosis. ?Neurologic:  Alert and oriented x3;  grossly normal neurologically. ?Skin:  Intact without significant lesions or  rashes. No jaundice. ?Psych:  Alert and cooperative. Normal mood and affect. ? ?Imaging Studies: ?Reviewed ? ?Assessment and Plan:  ? ?LUTISHA KNOCHE is a 41 y.o. 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  Colonoscopy including TI evaluation was unremarkable.  MR enterography was normal. ? ?MRI revealed moderate stool burden only ?The patient reports having regular bowel movements with high-fiber diet ?Encouraged to drink adequate amount of water ?She takes MiraLAX daily ?Trial of IBgard for abdominal bloating ? ?Chronic GERD ?Continue Protonix 40 mg daily before breakfast, prescription sent ? ?Follow up as needed ? ? ?Kristen Repress, MD ? ?

## 2021-10-03 ENCOUNTER — Other Ambulatory Visit: Payer: Self-pay | Admitting: Family Medicine

## 2021-11-26 ENCOUNTER — Other Ambulatory Visit: Payer: Self-pay | Admitting: Family Medicine

## 2021-11-26 DIAGNOSIS — F419 Anxiety disorder, unspecified: Secondary | ICD-10-CM

## 2021-11-26 DIAGNOSIS — F410 Panic disorder [episodic paroxysmal anxiety] without agoraphobia: Secondary | ICD-10-CM

## 2021-12-24 ENCOUNTER — Encounter: Payer: Self-pay | Admitting: Podiatry

## 2021-12-31 ENCOUNTER — Other Ambulatory Visit: Payer: Self-pay | Admitting: Family Medicine

## 2021-12-31 DIAGNOSIS — Z1231 Encounter for screening mammogram for malignant neoplasm of breast: Secondary | ICD-10-CM

## 2022-01-01 ENCOUNTER — Other Ambulatory Visit: Payer: Self-pay | Admitting: Family Medicine

## 2022-01-18 ENCOUNTER — Ambulatory Visit
Admission: RE | Admit: 2022-01-18 | Discharge: 2022-01-18 | Disposition: A | Discharge status: Home / Self Care | Payer: BC Managed Care – PPO | Source: Ambulatory Visit | Attending: Family Medicine | Admitting: Family Medicine

## 2022-01-18 DIAGNOSIS — Z1231 Encounter for screening mammogram for malignant neoplasm of breast: Secondary | ICD-10-CM | POA: Diagnosis not present

## 2022-01-22 ENCOUNTER — Other Ambulatory Visit: Payer: Self-pay | Admitting: Family Medicine

## 2022-01-22 DIAGNOSIS — R928 Other abnormal and inconclusive findings on diagnostic imaging of breast: Secondary | ICD-10-CM

## 2022-01-22 DIAGNOSIS — N6489 Other specified disorders of breast: Secondary | ICD-10-CM

## 2022-01-25 ENCOUNTER — Encounter: Payer: Self-pay | Admitting: Family Medicine

## 2022-01-25 DIAGNOSIS — T753XXA Motion sickness, initial encounter: Secondary | ICD-10-CM

## 2022-01-25 MED ORDER — SCOPOLAMINE 1 MG/3DAYS TD PT72: 1.0000 | MEDICATED_PATCH | TRANSDERMAL | 1 refills | Status: DC

## 2022-02-06 ENCOUNTER — Ambulatory Visit: Payer: BC Managed Care – PPO | Admitting: Podiatry

## 2022-02-14 ENCOUNTER — Ambulatory Visit
Admission: RE | Admit: 2022-02-14 | Discharge: 2022-02-14 | Disposition: A | Discharge status: Home / Self Care | Payer: BC Managed Care – PPO | Source: Ambulatory Visit | Attending: Family Medicine | Admitting: Family Medicine

## 2022-02-14 DIAGNOSIS — R928 Other abnormal and inconclusive findings on diagnostic imaging of breast: Secondary | ICD-10-CM

## 2022-02-14 DIAGNOSIS — N6489 Other specified disorders of breast: Secondary | ICD-10-CM | POA: Diagnosis present

## 2022-04-01 ENCOUNTER — Other Ambulatory Visit: Payer: Self-pay | Admitting: Family Medicine

## 2022-04-24 ENCOUNTER — Other Ambulatory Visit: Payer: Self-pay | Admitting: Family Medicine

## 2022-04-24 NOTE — Telephone Encounter (Signed)
Requested medication (s) are due for refill today: yes  Requested medication (s) are on the active medication list: yes  Last refill:  04/01/22 #30  Future visit scheduled: no  Notes to clinic:   called pt to make appt for refills.    Requested Prescriptions  Pending Prescriptions Disp Refills   amitriptyline (ELAVIL) 100 MG tablet [Pharmacy Med Name: AMITRIPTYLIN TAB 100MG ] 30 tablet 0    Sig: TAKE 1 TABLET AT BEDTIME     Psychiatry:  Antidepressants - Heterocyclics (TCAs) Failed - 04/24/2022  2:09 AM      Failed - Valid encounter within last 6 months    Recent Outpatient Visits           1 year ago Small bowel obstruction [facility])   [facility], [place]   1 year ago Right ear pain   [facility], MD   2 years ago Chronic anxiety   [facility], MD   2 years ago Chronic anxiety   [facility], MD   3 years ago Suspected Covid-19 Virus Infection   [facility] [place], [place], Alessandra Bevels       Future Appointments             In 3 months [place], [place] [facility]

## 2022-05-08 NOTE — Progress Notes (Signed)
I,Kristen [name],acting as a scribe for Kristen Merry, MD.,have documented all relevant documentation on the behalf of Kristen Merry, MD,as directed by  Kristen Merry, MD while in the presence of Kristen Merry, MD.   Established patient visit   Patient: Kristen [name]   DOB: 30-Dec-1980   41 y.o. Female  MRN: [id number] Visit Date: 05/10/2022  Today's healthcare provider: [name], MD   Chief Complaint  Patient presents with   Follow-up   Subjective    HPI  Follow up for Panic disorder without agoraphobia:  The patient was last seen for this 1 years ago. She continues on sertraline 150mg  every day which she has been taking for several years and dong very well. She occasionally has a 1-2 period of anxiety or depression, but states that 90% of the time she feels great. She continues to talk with therapist on regular basis.  Denies any adverse effects from medications. She also takes amitriptyline every night for migraine prevention but also finds it helps her sleep well.    She also has prescription for alprazolam which she takes when she feels a panic attack starting. She does not take this most days, but occasionally has bad days that she may need to take 2 or 3 times. It remains effective with no adverse effects    -----------------------------------------------------------------------------------------   Medications: Outpatient Medications Prior to Visit  Medication Sig   ALPRAZolam (XANAX) 1 MG tablet TAKE 1/2 TO 1 TABLET EVERY 4 HOURS AS NEEDED FOR      ANXIETY   amitriptyline (ELAVIL) 100 MG tablet TAKE 1 TABLET AT BEDTIME   levonorgestrel (MIRENA) 20 MCG/24HR IUD 1 each by Intrauterine route once. Inserted 01/21/2020   pantoprazole (PROTONIX) 40 MG tablet Take 1 tablet (40 mg total) by mouth daily before breakfast.   Semaglutide-Weight Management 1.7 MG/0.75ML SOAJ Inject into the skin.   sertraline (ZOLOFT) 100 MG tablet TAKE 1 AND 1/2 TABLETS (150MG  TOTAL) DAILY    tretinoin (RETIN-A) 0.1 % cream Apply topically at bedtime.   [DISCONTINUED] fluconazole (DIFLUCAN) 150 MG tablet Take 1 tablet (150 mg total) by mouth once a week.   [DISCONTINUED] scopolamine (TRANSDERM-SCOP, 1.5 MG,) 1 MG/3DAYS Place 1 patch (1.5 mg total) onto the skin every 3 (three) days.   No facility-administered medications prior to visit.    Review of Systems  Constitutional:  Negative for chills, diaphoresis and fever.  HENT:  Negative for congestion, ear discharge, ear pain, hearing loss, nosebleeds, sore throat and tinnitus.   Eyes:  Negative for photophobia, pain, discharge and redness.  Respiratory:  Negative for cough, shortness of breath, wheezing and stridor.   Cardiovascular:  Negative for chest pain, palpitations and leg swelling.  Gastrointestinal:  Negative for abdominal pain, blood in stool, constipation, diarrhea, nausea and vomiting.  Endocrine: Negative for polydipsia.  Genitourinary:  Negative for dysuria, flank pain, frequency, hematuria and urgency.  Musculoskeletal:  Negative for back pain, myalgias and neck pain.  Skin:  Negative for rash.  Allergic/Immunologic: Negative for environmental allergies.  Neurological:  Negative for dizziness, tremors, seizures, weakness and headaches.  Hematological:  Does not bruise/bleed easily.  Psychiatric/Behavioral:  Negative for hallucinations and suicidal ideas. The patient is not nervous/anxious.        Objective    BP 106/75 (BP Location: Left Arm, Patient Position: Sitting, Cuff Size: Normal)   Pulse (!) 102   Resp 14   Wt 188 lb (85.3 kg)   SpO2 98%   BMI 30.34 kg/m  Physical Exam   General: Appearance:    Obese female in no acute distress  Eyes:    PERRL, conjunctiva/corneas clear, EOM's intact       Lungs:     Clear to auscultation bilaterally, respirations unlabored  Heart:    Tachycardic. Normal rhythm. No murmurs, rubs, or gallops.    MS:   All extremities are intact.    Neurologic:   Awake,  alert, oriented x 3. No apparent focal neurological defect.         Assessment & Plan     1. Panic disorder without agoraphobia Doing well on sertraline for maintenance and prn alprazolam.  - sertraline (ZOLOFT) 100 MG tablet; TAKE 1 AND 1/2 TABLETS (150MG  TOTAL) DAILY  Dispense: 135 tablet; Refill: 3  2. Chronic anxiety   3. Migraine without aura and without status migrainosus, not intractable Very well controlled. Continue  amitriptyline (ELAVIL) 100 MG tablet; Take 1 tablet (100 mg total) by mouth at bedtime.  Dispense: 90 tablet; Refill: 3  Follow up annually     The entirety of the information documented in the History of Present Illness, Review of Systems and Physical Exam were personally obtained by me. Portions of this information were initially documented by the CMA and reviewed by me for thoroughness and accuracy.     , MD  [facility] [phone] (phone) [phone] (fax)  [facility]

## 2022-05-10 ENCOUNTER — Ambulatory Visit: Payer: BC Managed Care – PPO | Admitting: Family Medicine

## 2022-05-10 ENCOUNTER — Encounter: Payer: Self-pay | Admitting: Family Medicine

## 2022-05-10 VITALS — BP 106/75 | HR 102 | Resp 14 | Wt 188.0 lb

## 2022-05-10 DIAGNOSIS — F410 Panic disorder [episodic paroxysmal anxiety] without agoraphobia: Secondary | ICD-10-CM | POA: Diagnosis not present

## 2022-05-10 DIAGNOSIS — F419 Anxiety disorder, unspecified: Secondary | ICD-10-CM | POA: Diagnosis not present

## 2022-05-10 DIAGNOSIS — G43009 Migraine without aura, not intractable, without status migrainosus: Secondary | ICD-10-CM | POA: Diagnosis not present

## 2022-05-10 MED ORDER — SERTRALINE HCL 100 MG PO TABS: ORAL_TABLET | ORAL | 3 refills | Status: DC

## 2022-05-10 MED ORDER — AMITRIPTYLINE HCL 100 MG PO TABS: 100.0000 mg | ORAL_TABLET | Freq: Every day | ORAL | 3 refills | Status: DC

## 2022-07-24 ENCOUNTER — Ambulatory Visit
Admission: RE | Admit: 2022-07-24 | Discharge: 2022-07-24 | Disposition: A | Discharge status: Home / Self Care | Payer: BC Managed Care – PPO | Source: Ambulatory Visit | Attending: Urology | Admitting: Urology

## 2022-07-24 ENCOUNTER — Ambulatory Visit
Admission: RE | Admit: 2022-07-24 | Discharge: 2022-07-24 | Disposition: A | Discharge status: Home / Self Care | Payer: BC Managed Care – PPO | Attending: Urology | Admitting: Urology

## 2022-07-24 ENCOUNTER — Ambulatory Visit: Payer: BC Managed Care – PPO | Admitting: Urology

## 2022-07-24 VITALS — BP 106/73 | HR 90 | Ht 66.0 in | Wt 201.0 lb

## 2022-07-24 DIAGNOSIS — N200 Calculus of kidney: Secondary | ICD-10-CM

## 2022-07-24 DIAGNOSIS — Z87442 Personal history of urinary calculi: Secondary | ICD-10-CM

## 2022-07-24 NOTE — Progress Notes (Signed)
I, Kristen [name],acting as a scribe for Hollice Espy, MD.,have documented all relevant documentation on the behalf of Hollice Espy, MD,as directed by  Hollice Espy, MD while in the presence of Hollice Espy, MD.   07/24/22 10:16 AM   Kristen [name] 02-27-1981 [id number]  Referring provider: Birdie Sons, MD [address] [place],  [place] [address]  Chief Complaint  Patient presents with   Nephrolithiasis    Follow up    HPI: 42 year-old female who presents today for kidney stone follow-up. She was last seen a year ago.   She is s/p uncomplicated ureteroscopy on 12/18/2020 for a large 1.2 cm right renal pelvic stone. Stone analysis was 40% calcium oxalate monohydrate, 50% calcium oxalate dihydrate, and 10% hydroxyapatite.   KUB personally reviewed today and awaiting final radiologic interpretation. No obvious stone burden.   She did not report any changes in lifestyle to prevent stone formation. She did not report any new symptoms or concerns related to kidney stones.  PMH: Past Medical History:  Diagnosis Date   Agoraphobia with panic attacks    Anxiety    Depression    Dyslipidemia    not requiring meds   Hernia    Kidney stone    Migraine headache    Nausea vomiting and diarrhea 03/04/2021   SBO (small bowel obstruction) ([place]) 03/04/2021    Surgical History: Past Surgical History:  Procedure Laterality Date   COLONOSCOPY WITH PROPOFOL N/A 05/07/2021   Procedure: COLONOSCOPY WITH PROPOFOL;  Surgeon: Lin Landsman, MD;  Location: [facility] ENDOSCOPY;  Service: Gastroenterology;  Laterality: N/A;   COLONOSCOPY WITH PROPOFOL N/A 05/08/2021   Procedure: COLONOSCOPY WITH PROPOFOL;  Surgeon: Lin Landsman, MD;  Location: [facility] ENDOSCOPY;  Service: Gastroenterology;  Laterality: N/A;   CYSTOSCOPY/URETEROSCOPY/HOLMIUM LASER/STENT PLACEMENT Right 12/18/2020   Procedure: CYSTOSCOPY/URETEROSCOPY/HOLMIUM LASER/STENT PLACEMENT;  Surgeon: Hollice Espy, MD;  Location: [facility] ORS;  Service: Urology;  Laterality: Right;   DIAGNOSTIC LAPAROSCOPY     HERNIA REPAIR  05/21/1983   INTRAUTERINE DEVICE INSERTION  01/27/2015   Mirena   LAPAROSCOPIC GASTRIC BANDING N/A 12/26/2012   Procedure: LAPAROSCOPIC GASTRIC BANDING REVISION ;  Surgeon: Gayland Curry, MD;  Location: [facility] ORS;  Service: General;  Laterality: N/A;   TONSILLECTOMY AND ADENOIDECTOMY  05/20/2005    Home Medications:  Allergies as of 07/24/2022       Reactions   Imitrex [sumatriptan] Nausea And Vomiting        Medication List        Accurate as of July 24, 2022 10:16 AM. If you have any questions, ask your nurse or doctor.          ALPRAZolam 1 MG tablet Commonly known as: XANAX TAKE 1/2 TO 1 TABLET EVERY 4 HOURS AS NEEDED FOR      ANXIETY   amitriptyline 100 MG tablet Commonly known as: ELAVIL Take 1 tablet (100 mg total) by mouth at bedtime.   levonorgestrel 20 MCG/24HR IUD Commonly known as: MIRENA 1 each by Intrauterine route once. Inserted 01/21/2020   pantoprazole 40 MG tablet Commonly known as: PROTONIX Take 1 tablet (40 mg total) by mouth daily before breakfast.   sertraline 100 MG tablet Commonly known as: ZOLOFT TAKE 1 AND 1/2 TABLETS ('150MG'$  TOTAL) DAILY        Allergies:  Allergies  Allergen Reactions   Imitrex [Sumatriptan] Nausea And Vomiting    Family History: Family History  Problem Relation Age of Onset   Hypertension  Father    Stroke Father    Hyperlipidemia Father    Migraines Mother    Cancer Mother        cervical   Hyperlipidemia Mother    Bipolar disorder Sister    Migraines Sister    Breast cancer Paternal Grandmother 90    Social History:  reports that she has never smoked. She has never used smokeless tobacco. She reports that she does not currently use alcohol. She reports that she does not use drugs.   Physical Exam: BP 106/73   Pulse 90   Ht '5\' 6"'$  (1.676 m)   Wt 201 lb (91.2 kg)   BMI 32.44 kg/m    Constitutional:  Alert and oriented, No acute distress. HEENT: [place] AT, moist mucus membranes.  Trachea midline, no masses. Neurologic: Grossly intact, no focal deficits, moving all 4 extremities. Psychiatric: Normal mood and affect.  Pertinent Imaging:  KUB personally reviewed today and awaiting final radiologic interpretation. No obvious stone burden.  Assessment & Plan:    History of kidney stones - No obvious stone burden on KUB today. Will await the final radiologic interpretation of the KUB and notify her if any additional stones are identified. - We discussed general stone prevention techniques including drinking plenty water with goal of producing 2.5 L urine daily, increased citric acid intake, avoidance of high oxalate containing foods, and decreased salt intake.  Information about dietary recommendations given today.   - Advised that no further x-rays are needed at this time, given the absence of new stone formation in the last year and a half. She was also advised to contact the clinic if she experiences any symptoms or concerns suggestive of new stone formation.  Return if symptoms worsen or fail to improve.  I have reviewed the above documentation for accuracy and completeness, and I agree with the above.   Hollice Espy, MD  [facility] [address], [place], [place] [address] [phone]

## 2022-08-05 IMAGING — RF DG UGI W/ HIGH DENSITY W/O KUB
14 of 24 series · 14 of 24 positions shown · non-contrast
Comparison: 12/02/2017

CLINICAL DATA: History of laparoscopic gastric banding surgery.

EXAM:
UPPER GI SERIES WITH KUB
TECHNIQUE: After obtaining a scout radiograph a routine upper GI series was
performed using thin and high density barium.
FLUOROSCOPY TIME:  Fluoroscopy Time:  54 seconds
Radiation Exposure Index (if provided by the fluoroscopic device):
4.7 mGy
Number of Acquired Spot Images: 0

[Series 1: t abdomen supine · 0.14mm/px · 1 of 1 slices shown]
[im 1/1]
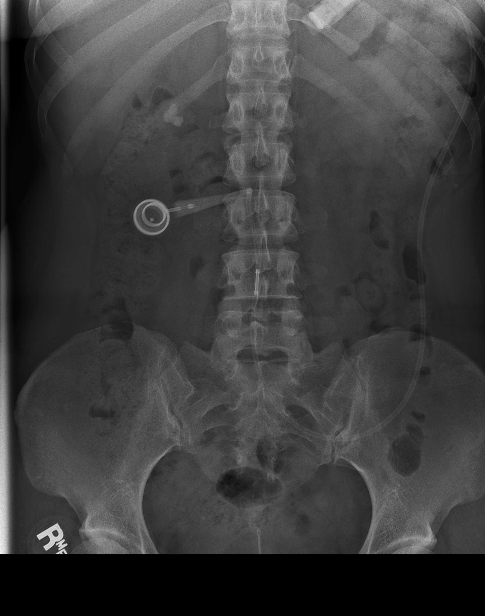

[Series 3: cp_standard · 0.25mm/px · 1 of 1 slices shown (1 of 13)]
[im 1/1]
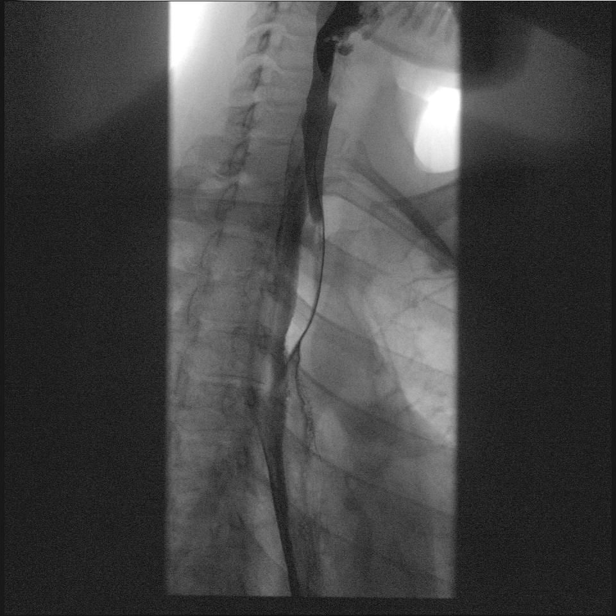

[Series 5: cp_standard · 0.25mm/px · 1 of 1 slices shown (2 of 13)]
[im 1/1]
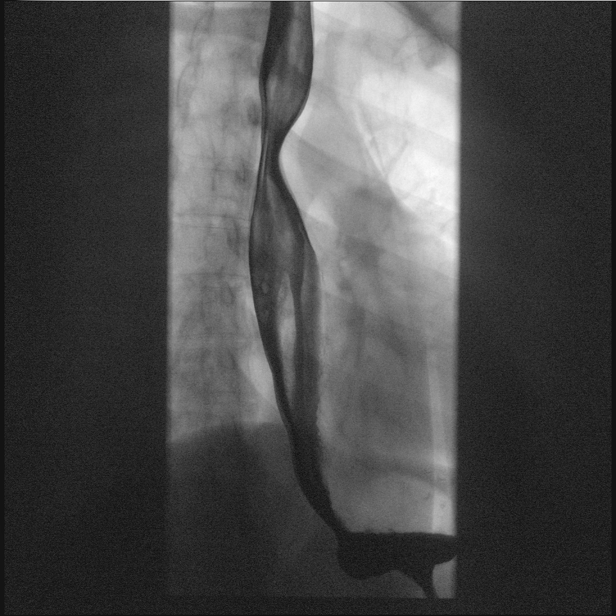

[Series 7: cp_standard · 0.25mm/px · 1 of 1 slices shown (3 of 13)]
[im 1/1]
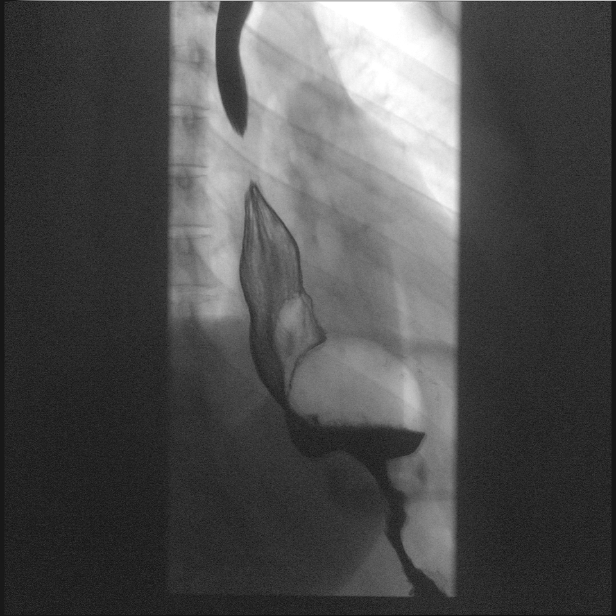

[Series 8: cp_standard · 0.25mm/px · 1 of 1 slices shown (4 of 13)]
[im 1/1]
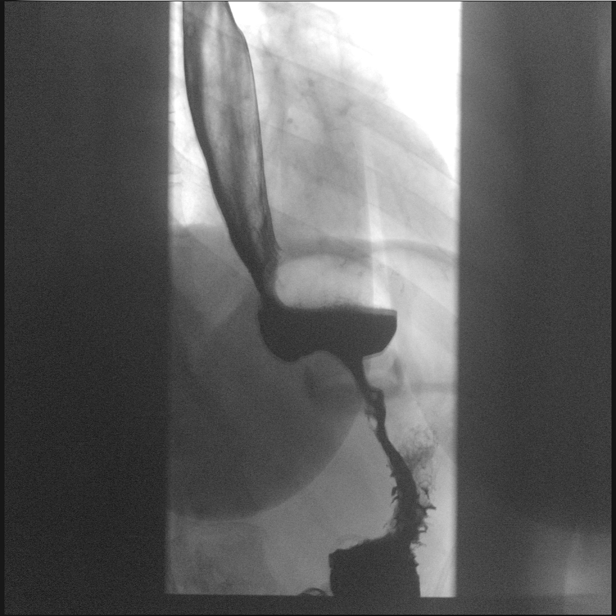

[Series 10: cp_standard · 0.27mm/px · 1 of 1 slices shown (5 of 13)]
[im 1/1]
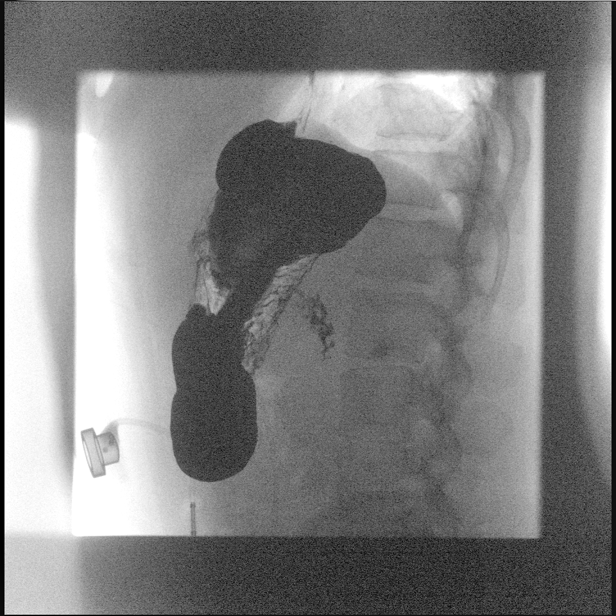

[Series 12: cp_standard · 0.27mm/px · 1 of 1 slices shown (6 of 13)]
[im 1/1]
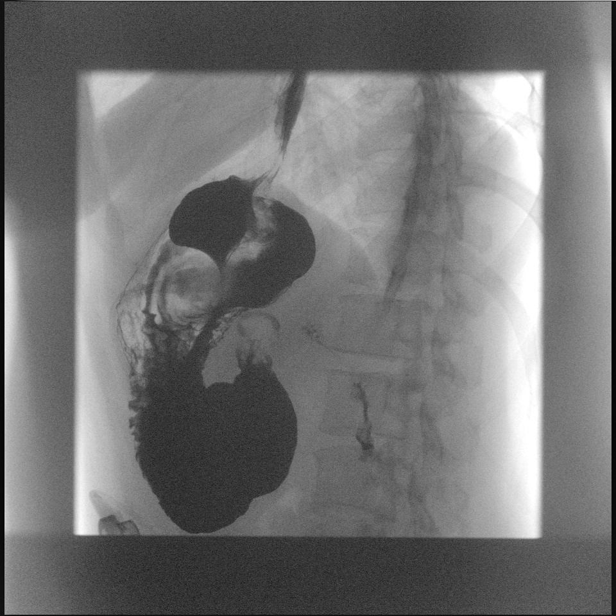

[Series 13: cp_standard · 0.27mm/px · 1 of 1 slices shown (7 of 13)]
[im 1/1]
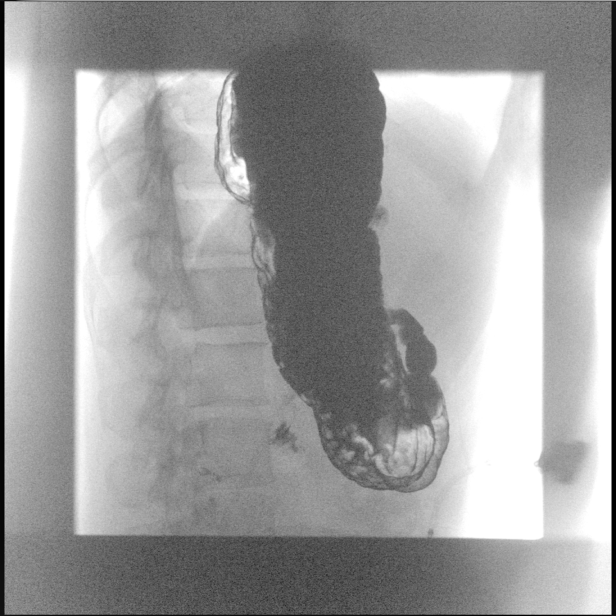

[Series 15: cp_standard · 0.27mm/px · 1 of 1 slices shown (8 of 13)]
[im 1/1]
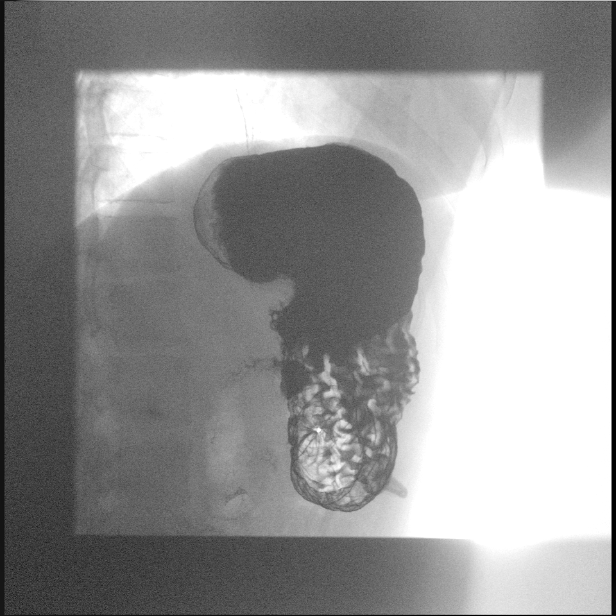

[Series 17: cp_standard · 0.27mm/px · 1 of 1 slices shown (9 of 13)]
[im 1/1]
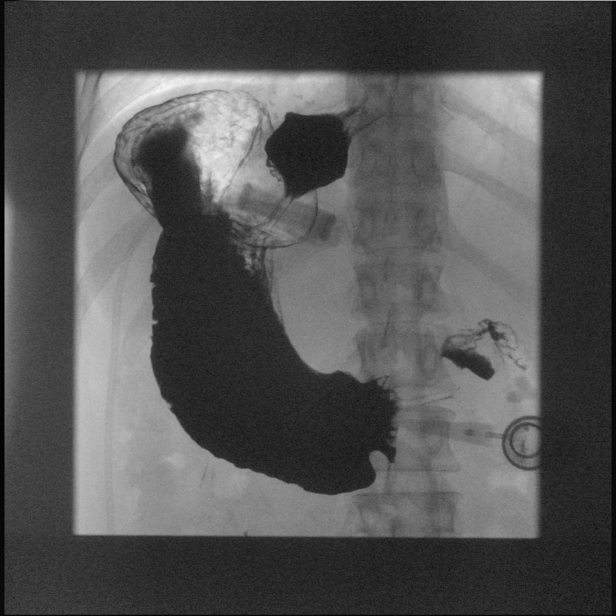

[Series 19: cp_standard · 0.27mm/px · 1 of 1 slices shown (10 of 13)]
[im 1/1]
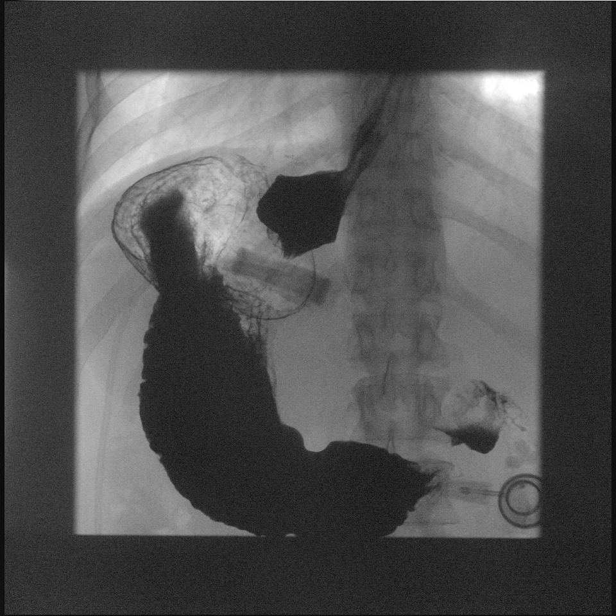

[Series 20: cp_standard · 0.27mm/px · 1 of 1 slices shown (11 of 13)]
[im 1/1]
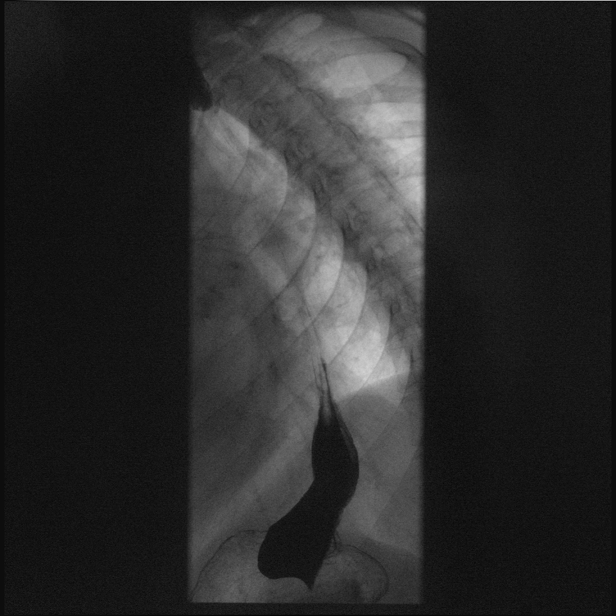

[Series 22: cp_standard · 0.27mm/px · 1 of 1 slices shown (12 of 13)]
[im 1/1]
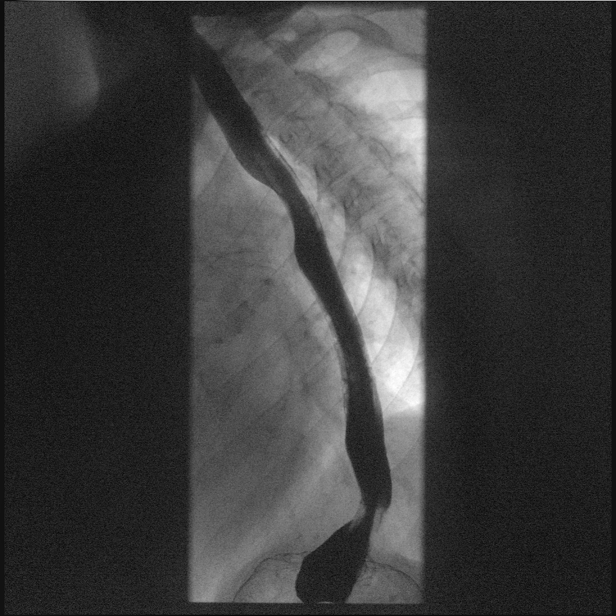

[Series 24: cp_standard · 0.27mm/px · 1 of 1 slices shown (13 of 13)]
[im 1/1]
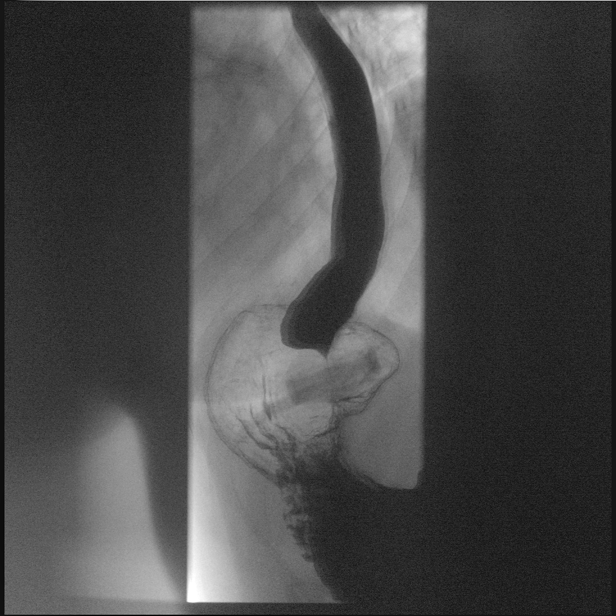

[14 of 24 positions shown; findings below may reference images not displayed]

FINDINGS: KUB: Lap band is present.  T-type IUD is noted.

No bowel dilatation to suggest obstruction. No evidence of
pneumoperitoneum, portal venous gas or pneumatosis.

No pathologic calcifications along the expected course of the
ureters.

No acute osseous abnormality.

Examination of the esophagus demonstrated normal esophageal
motility. Normal esophageal morphology without evidence of
esophagitis or ulceration. No esophageal stricture, diverticula, or
mass lesion. No evidence of hiatal hernia. Moderate severity
gastroesophageal reflux.

Examination of the stomach demonstrated normal rugal folds and areae
gastricae. The gastric mucosa appeared unremarkable without evidence
of ulceration, scarring, or mass lesion. Gastric motility and
emptying was normal. Lap band is present around the proximal stomach
in similar position to the prior examination of 12/02/2017. Proximal
pouch is dilated with contrast flowing through the lap band. The
opening at the level of the lap band measures 6 mm in diameter.
Fluoroscopic examination of the duodenum demonstrates normal
motility and morphology without evidence of ulceration or mass
lesion.
IMPRESSION: 1. Lap band is present around the proximal stomach in similar
position to the prior examination of 12/02/2017. Proximal pouch
dilatation concerning for band slippage. Contrast flows through the
lap band with small amounts of residual contrast in the proximal
pouch. The opening at the level of the lap band measures 6 mm in
diameter concerning for relative stomal stenosis.

2. Moderate severity gastroesophageal reflux as can be seen with
relative stomal stenosis at the level of the lap band.

## 2022-09-03 NOTE — Progress Notes (Signed)
I,Sulibeya S Dimas,acting as a Neurosurgeon for [name], MD.,have documented all relevant documentation on the behalf of [name], MD,as directed by  [name], MD while in the presence of [name], MD.   MyChart Video Visit    Virtual Visit via Video Note   This format is felt to be most appropriate for this patient at this time. Physical exam was limited by quality of the video and audio technology used for the visit.   Patient location: home Provider location: [facility]  I discussed the limitations of evaluation and management by telemedicine and the availability of in person appointments. The patient expressed understanding and agreed to proceed.  Patient: Kristen [name]   DOB: January 02, 1981   42 y.o. Female  MRN: [id number] Visit Date: 09/04/2022  Today's healthcare provider: [name], MD   Chief Complaint  Patient presents with   URI   Subjective    HPI  Upper respiratory symptoms She complains of achiness, non productive cough, post nasal drip, shortness of breath, sinus pressure, and sneezing.with chills, night sweats. Onset of symptoms was  3 weeks ago and worsening.She is drinking plenty of fluids.  Dry, cough is persistent, short of breath, sweats. No history of asthma. Past history is significant for no history of pneumonia or bronchitis. Patient is non-smoker. OTC cold medications including Zyrtec, Advil cold and sinus. No improvement. No allergy nose spray.   Also reports 4-5 months fatigue, tingling in feet and general myalgia, hot&cold, chills and sweats, feels like bones hurting intermittently.  ---------------------------------------------------------------------------------------------------   Medications: Outpatient Medications Prior to Visit  Medication Sig   ALPRAZolam (XANAX) 1 MG tablet TAKE 1/2 TO 1 TABLET EVERY 4 HOURS AS NEEDED FOR      ANXIETY   amitriptyline (ELAVIL) 100 MG tablet Take 1 tablet (100 mg total) by mouth at bedtime.    levonorgestrel (MIRENA) 20 MCG/24HR IUD 1 each by Intrauterine route once. Inserted 01/21/2020   pantoprazole (PROTONIX) 40 MG tablet Take 1 tablet (40 mg total) by mouth daily before breakfast.   phentermine (ADIPEX-P) 37.5 MG tablet Take 37.5 mg by mouth daily before breakfast.   sertraline (ZOLOFT) 100 MG tablet TAKE 1 AND 1/2 TABLETS (150MG  TOTAL) DAILY   No facility-administered medications prior to visit.    Review of Systems  Constitutional:  Positive for fatigue. Negative for chills and fever.  HENT:  Positive for postnasal drip, sinus pressure and sinus pain. Negative for congestion and rhinorrhea.   Respiratory:  Positive for cough and shortness of breath. Negative for wheezing.   Endocrine: Positive for cold intolerance and heat intolerance.  Musculoskeletal:  Positive for myalgias.  Allergic/Immunologic: Positive for environmental allergies.  Neurological:  Positive for numbness.      Objective    BP 110/68   Temp 99.2 F (37.3 C)   Wt 203 lb (92.1 kg)   BMI 32.77 kg/m      Physical Exam   Awake, alert, oriented x 3. In no apparent distress.Coughing persistently throughout interview.   Assessment & Plan     1. Subacute cough  - azithromycin (ZITHROMAX) 250 MG tablet; Take 2 tablets on day 1, then 1 tablet daily on days 2 through 5  Dispense: 6 tablet; Refill: 0 - HYDROcodone bit-homatropine (HYCODAN) 5-1.5 MG/5ML syrup; Take 5 mLs by mouth every 8 (eight) hours as needed for cough.  Dispense: 120 mL; Refill: 0 - DG Chest 2 View; Future - QuantiFERON-TB Gold Plus  2. Shortness of breath  - azithromycin (  ZITHROMAX) 250 MG tablet; Take 2 tablets on day 1, then 1 tablet daily on days 2 through 5  Dispense: 6 tablet; Refill: 0 - HYDROcodone bit-homatropine (HYCODAN) 5-1.5 MG/5ML syrup; Take 5 mLs by mouth every 8 (eight) hours as needed for cough.  Dispense: 120 mL; Refill: 0 - DG Chest 2 View; Future - QuantiFERON-TB Gold Plus  3. Myalgia  - ANA w/Reflex if  Positive  4. Numbness and tingling of both feet  - Vitamin B12 - VITAMIN D 25 Hydroxy (Vit-D Deficiency, Fractures)  5. Arthralgia, unspecified joint  - ANA w/Reflex if Positive - Lyme Disease Serology w/Reflex  6. Chills  - CBC with Differential/Platelet - QuantiFERON-TB Gold Plus  7. Diaphoresis  - CBC with Differential/Platelet - QuantiFERON-TB Gold Plus  8. Other fatigue  - Comprehensive metabolic panel - TSH - T4, free - Vitamin B12 - VITAMIN D 25 Hydroxy (Vit-D Deficiency, Fractures) - Lyme Disease Serology w/Reflex - QuantiFERON-TB Gold Plus     I discussed the assessment and treatment plan with the patient. The patient was provided an opportunity to ask questions and all were answered. The patient agreed with the plan and demonstrated an understanding of the instructions.   The patient was advised to call back or seek an in-person evaluation if the symptoms worsen or if the condition fails to improve as anticipated.  I provided 15 minutes of non-face-to-face time during this encounter.  The entirety of the information documented in the History of Present Illness, Review of Systems and Physical Exam were personally obtained by me. Portions of this information were initially documented by the CMA and reviewed by me for thoroughness and accuracy.    [name], MD [facility] [phone] (phone) [phone] (fax)  [facility]

## 2022-09-04 ENCOUNTER — Telehealth: Payer: BC Managed Care – PPO | Admitting: Family Medicine

## 2022-09-04 ENCOUNTER — Encounter: Payer: Self-pay | Admitting: Family Medicine

## 2022-09-04 ENCOUNTER — Ambulatory Visit
Admission: RE | Admit: 2022-09-04 | Discharge: 2022-09-04 | Disposition: A | Discharge status: Home / Self Care | Payer: BC Managed Care – PPO | Attending: Family Medicine | Admitting: Family Medicine

## 2022-09-04 ENCOUNTER — Ambulatory Visit
Admission: RE | Admit: 2022-09-04 | Discharge: 2022-09-04 | Disposition: A | Discharge status: Home / Self Care | Payer: BC Managed Care – PPO | Source: Ambulatory Visit | Attending: Family Medicine | Admitting: Family Medicine

## 2022-09-04 VITALS — BP 110/68 | Temp 99.2°F | Wt 203.0 lb

## 2022-09-04 DIAGNOSIS — R5383 Other fatigue: Secondary | ICD-10-CM

## 2022-09-04 DIAGNOSIS — R200 Anesthesia of skin: Secondary | ICD-10-CM | POA: Diagnosis not present

## 2022-09-04 DIAGNOSIS — R0602 Shortness of breath: Secondary | ICD-10-CM

## 2022-09-04 DIAGNOSIS — M791 Myalgia, unspecified site: Secondary | ICD-10-CM | POA: Diagnosis not present

## 2022-09-04 DIAGNOSIS — R052 Subacute cough: Secondary | ICD-10-CM

## 2022-09-04 DIAGNOSIS — R6883 Chills (without fever): Secondary | ICD-10-CM

## 2022-09-04 DIAGNOSIS — M2550 Pain in unspecified joint: Secondary | ICD-10-CM

## 2022-09-04 DIAGNOSIS — R202 Paresthesia of skin: Secondary | ICD-10-CM

## 2022-09-04 DIAGNOSIS — R61 Generalized hyperhidrosis: Secondary | ICD-10-CM

## 2022-09-04 MED ORDER — HYDROCODONE BIT-HOMATROP MBR 5-1.5 MG/5ML PO SOLN
5.0000 mL | Freq: Three times a day (TID) | ORAL | 0 refills | Status: DC | PRN
Start: 2022-09-04 — End: 2024-04-09

## 2022-09-04 MED ORDER — AZITHROMYCIN 250 MG PO TABS
ORAL_TABLET | ORAL | 0 refills | Status: AC
Start: 2022-09-04 — End: 2022-09-09

## 2022-09-05 LAB — CBC WITH DIFFERENTIAL/PLATELET
Eos: 5 %
Lymphocytes Absolute: 2.6 10*3/uL (ref 0.7–3.1)
MCV: 93 fL (ref 79–97)
Monocytes Absolute: 0.6 10*3/uL (ref 0.1–0.9)

## 2022-09-06 LAB — CBC WITH DIFFERENTIAL/PLATELET
Immature Granulocytes: 0 %
MCHC: 34.4 g/dL (ref 31.5–35.7)
Neutrophils: 54 %
WBC: 7.8 10*3/uL (ref 3.4–10.8)

## 2022-09-06 LAB — ANA W/REFLEX IF POSITIVE: Anti Nuclear Antibody (ANA): NEGATIVE

## 2022-09-06 LAB — LYME DISEASE SEROLOGY W/REFLEX: Lyme Total Antibody EIA: NEGATIVE

## 2022-09-06 LAB — QUANTIFERON-TB GOLD PLUS

## 2022-09-08 LAB — COMPREHENSIVE METABOLIC PANEL
ALT: 19 IU/L (ref 0–32)
AST: 18 IU/L (ref 0–40)
Albumin/Globulin Ratio: 1.5 (ref 1.2–2.2)
Albumin: 4.3 g/dL (ref 3.9–4.9)
Alkaline Phosphatase: 92 IU/L (ref 44–121)
BUN/Creatinine Ratio: 13 (ref 9–23)
BUN: 9 mg/dL (ref 6–24)
Bilirubin Total: 0.5 mg/dL (ref 0.0–1.2)
CO2: 23 mmol/L (ref 20–29)
Calcium: 10.1 mg/dL (ref 8.7–10.2)
Chloride: 102 mmol/L (ref 96–106)
Creatinine, Ser: 0.67 mg/dL (ref 0.57–1.00)
Globulin, Total: 2.8 g/dL (ref 1.5–4.5)
Glucose: 106 mg/dL — ABNORMAL HIGH (ref 70–99)
Potassium: 4.2 mmol/L (ref 3.5–5.2)
Sodium: 139 mmol/L (ref 134–144)
Total Protein: 7.1 g/dL (ref 6.0–8.5)
eGFR: 112 mL/min/{1.73_m2} (ref >59)

## 2022-09-08 LAB — CBC WITH DIFFERENTIAL/PLATELET
Basophils Absolute: 0.1 10*3/uL (ref 0.0–0.2)
Basos: 1 %
EOS (ABSOLUTE): 0.4 10*3/uL (ref 0.0–0.4)
Hematocrit: 41.9 % (ref 34.0–46.6)
Hemoglobin: 14.4 g/dL (ref 11.1–15.9)
Immature Grans (Abs): 0 10*3/uL (ref 0.0–0.1)
Lymphs: 33 %
MCH: 31.9 pg (ref 26.6–33.0)
Monocytes: 7 %
Neutrophils Absolute: 4.2 10*3/uL (ref 1.4–7.0)
Platelets: 355 10*3/uL (ref 150–450)
RBC: 4.52 x10E6/uL (ref 3.77–5.28)
RDW: 11.3 % — ABNORMAL LOW (ref 11.7–15.4)

## 2022-09-08 LAB — T4, FREE: Free T4: 0.94 ng/dL (ref 0.82–1.77)

## 2022-09-08 LAB — QUANTIFERON-TB GOLD PLUS
QuantiFERON Mitogen Value: 7.15 IU/mL
QuantiFERON Nil Value: 0.02 IU/mL
QuantiFERON TB2 Ag Value: 0.03 IU/mL
QuantiFERON-TB Gold Plus: NEGATIVE

## 2022-09-08 LAB — VITAMIN B12: Vitamin B-12: 646 pg/mL (ref 232–1245)

## 2022-09-08 LAB — TSH: TSH: 1.04 u[IU]/mL (ref 0.450–4.500)

## 2022-09-08 LAB — VITAMIN D 25 HYDROXY (VIT D DEFICIENCY, FRACTURES): Vit D, 25-Hydroxy: 19.5 ng/mL — ABNORMAL LOW (ref 30.0–100.0)

## 2022-09-09 ENCOUNTER — Telehealth: Payer: Self-pay | Admitting: Family Medicine

## 2022-09-09 ENCOUNTER — Encounter: Payer: Self-pay | Admitting: Family Medicine

## 2022-09-09 NOTE — Telephone Encounter (Signed)
French Ana from [facility] calling to report that the chest x-ray from the patient  shows evidence of pneumonia. Please have imaging report reviewed by provider.

## 2022-09-10 ENCOUNTER — Encounter: Payer: Self-pay | Admitting: Family Medicine

## 2022-09-10 DIAGNOSIS — J189 Pneumonia, unspecified organism: Secondary | ICD-10-CM

## 2022-09-10 MED ORDER — DOXYCYCLINE HYCLATE 100 MG PO TABS
100.0000 mg | ORAL_TABLET | Freq: Two times a day (BID) | ORAL | 0 refills | Status: AC
Start: 2022-09-10 — End: 2022-09-20

## 2022-11-11 ENCOUNTER — Ambulatory Visit: Payer: BC Managed Care – PPO | Admitting: Physician Assistant

## 2022-11-11 VITALS — BP 106/75 | HR 77 | Temp 98.7°F | Ht 66.0 in | Wt 213.0 lb

## 2022-11-11 DIAGNOSIS — H938X2 Other specified disorders of left ear: Secondary | ICD-10-CM

## 2022-11-11 DIAGNOSIS — H6123 Impacted cerumen, bilateral: Secondary | ICD-10-CM

## 2022-11-11 MED ORDER — LEVOCETIRIZINE DIHYDROCHLORIDE 5 MG PO TABS
5.0000 mg | ORAL_TABLET | Freq: Every evening | ORAL | 1 refills | Status: DC
Start: 2022-11-11 — End: 2024-04-09

## 2022-11-11 NOTE — Progress Notes (Signed)
Established patient visit  Patient: Kristen [name]   DOB: Jan 13, 1981   42 y.o. Female  MRN: [id number] Visit Date: 11/11/2022  Today's healthcare provider: Debera Lat, PA-C   Chief Complaint  Patient presents with   Ear Fullness   Subjective     HPI   Pt states she has had pressure in her left ear for about 2 weeks. Pt has tried debrox and hydrogen peroxide with relief of symptoms. Last edited by Daneen Schick, CMA on 11/11/2022  2:55 PM.      Discussed the use of AI scribe software for clinical note transcription with the patient, who gave verbal consent to proceed.  History of Present Illness   The patient, with a history of seasonal allergies and depression, presents with a two-week history of fullness in the left ear. She initially attributed this to allergies and treated it with over-the-counter medications including Benadryl and Advil Sinus. The patient reports that the fullness in the ear persisted even after resolution of allergy symptoms, including nasal congestion and post-nasal drainage.  She has a history of managing their seasonal allergies, which typically occur in the spring, with Zyrtec. She also has a history of depression, which is currently managed with medication and counseling.          11/11/2022    2:57 PM 09/04/2022    8:44 AM 04/18/2020    7:52 AM  Depression screen PHQ 2/9  Decreased Interest 3 0 0  Down, Depressed, Hopeless 2 0 0  PHQ - 2 Score 5 0 0  Altered sleeping 1  0  Tired, decreased energy 3  0  Change in appetite 3  0  Feeling bad or failure about yourself  2  0  Trouble concentrating 1  0  Moving slowly or fidgety/restless 0  0  Suicidal thoughts 0  0  PHQ-9 Score 15  0  Difficult doing work/chores Not difficult at all  Not difficult at all      04/18/2020    7:53 AM  GAD 7 : Generalized Anxiety Score  Nervous, Anxious, on Edge 2  Control/stop worrying 2  Worry too much - different things 3  Trouble relaxing 3  Restless 0   Easily annoyed or irritable 0  Afraid - awful might happen 0  Total GAD 7 Score 10  Anxiety Difficulty Not difficult at all    Medications: Outpatient Medications Prior to Visit  Medication Sig   ALPRAZolam (XANAX) 1 MG tablet TAKE 1/2 TO 1 TABLET EVERY 4 HOURS AS NEEDED FOR      ANXIETY   amitriptyline (ELAVIL) 100 MG tablet Take 1 tablet (100 mg total) by mouth at bedtime.   levonorgestrel (MIRENA) 20 MCG/24HR IUD 1 each by Intrauterine route once. Inserted 01/21/2020   metFORMIN (GLUCOPHAGE) 500 MG tablet Take 500 mg by mouth daily.   pantoprazole (PROTONIX) 40 MG tablet Take 1 tablet (40 mg total) by mouth daily before breakfast.   phentermine (ADIPEX-P) 37.5 MG tablet Take 37.5 mg by mouth daily before breakfast.   sertraline (ZOLOFT) 100 MG tablet TAKE 1 AND 1/2 TABLETS (150MG  TOTAL) DAILY   HYDROcodone bit-homatropine (HYCODAN) 5-1.5 MG/5ML syrup Take 5 mLs by mouth every 8 (eight) hours as needed for cough.   No facility-administered medications prior to visit.    Review of Systems  All other systems reviewed and are negative.  Except see HPI      Objective    BP 106/75 (BP Location: Right Arm, Patient Position: Sitting,  Cuff Size: Normal)   Pulse 77   Temp 98.7 F (37.1 C) (Oral)   Ht 5\' 6"  (1.676 m)   Wt 213 lb (96.6 kg)   SpO2 99%   BMI 34.38 kg/m    Physical Exam Vitals reviewed.  Constitutional:      General: She is not in acute distress.    Appearance: Normal appearance. She is well-developed. She is not diaphoretic.  HENT:     Head: Normocephalic and atraumatic.     Right Ear: There is impacted cerumen.     Left Ear: There is impacted cerumen.     Nose: Congestion and rhinorrhea present.     Mouth/Throat:     Comments: Some postnasal drainage Eyes:     General: No scleral icterus.       Right eye: No discharge.        Left eye: No discharge.     Extraocular Movements: Extraocular movements intact.     Conjunctiva/sclera: Conjunctivae normal.      Pupils: Pupils are equal, round, and reactive to light.  Neck:     Thyroid: No thyromegaly.  Cardiovascular:     Rate and Rhythm: Normal rate and regular rhythm.     Pulses: Normal pulses.     Heart sounds: Normal heart sounds. No murmur heard. Pulmonary:     Effort: Pulmonary effort is normal. No respiratory distress.     Breath sounds: Normal breath sounds. No wheezing, rhonchi or rales.  Musculoskeletal:     Cervical back: Normal range of motion and neck supple.     Right lower leg: No edema.     Left lower leg: No edema.  Lymphadenopathy:     Cervical: Cervical adenopathy present.  Skin:    General: Skin is warm and dry.     Findings: No rash.  Neurological:     Mental Status: She is alert and oriented to person, place, and time. Mental status is at baseline.  Psychiatric:        Mood and Affect: Mood normal.        Behavior: Behavior normal.      No results found for any visits on 11/11/22.  Assessment & Plan        Eustachian Tube Dysfunction: Fullness in left ear for two weeks, could be related to allergies. Some nasal congestion, post nasal drainage. No wheezing. Recent use of over-the-counter Benadryl and Advil Sinus. Seasonal allergies typically treated with Zyrtec. Ear lavage performed with some relief noted. -Switch from Zyrtec to Xyzal for antihistamine therapy. -Continue nasal saline rinse or spray. -Start Flonase, two puffs twice daily for one week, then decrease to one puff. -Consider use of a humidifier and increased water intake. -Consider Mucinex to decrease post nasal drainage. -Consider warm salt gargle to decrease post nasal drainage. - levocetirizine (XYZAL) 5 MG tablet; Take 1 tablet (5 mg total) by mouth every evening.  Dispense: 30 tablet; Refill: 1   Bilateral hearing loss due to cerumen impaction After soaking with Debrox, ear canals were irrigated with water until left ear canal was cleared. Patient reported improvement in hearing bilaterally.  Patient tolerated procedure well.  Was performed by CMA -Check ear after lavage, see above.  Depression: High depression score noted. Currently on medication and receiving counseling. No need for dosage increase reported. -Continue current medication and counseling. -Consider incorporating more relaxation techniques and meditation.      No follow-ups on file.     The patient was advised to call  back or seek an in-person evaluation if the symptoms worsen or if the condition fails to improve as anticipated.  I discussed the assessment and treatment plan with the patient. The patient was provided an opportunity to ask questions and all were answered. The patient agreed with the plan and demonstrated an understanding of the instructions.  I, Debera Lat, PA-C have reviewed all documentation for this visit. The documentation on  11/11/22 for the exam, diagnosis, procedures, and orders are all accurate and complete.  Debera Lat, [facility], MMS [facility] [phone] (phone) [phone] (fax)  [facility]

## 2023-02-25 ENCOUNTER — Encounter: Payer: Self-pay | Admitting: Family Medicine

## 2023-02-26 ENCOUNTER — Other Ambulatory Visit: Payer: Self-pay

## 2023-02-26 DIAGNOSIS — F410 Panic disorder [episodic paroxysmal anxiety] without agoraphobia: Secondary | ICD-10-CM

## 2023-02-26 MED ORDER — ALPRAZOLAM 1 MG PO TABS: ORAL_TABLET | ORAL | 2 refills | Status: DC

## 2023-05-01 ENCOUNTER — Ambulatory Visit: Payer: BC Managed Care – PPO | Admitting: Obstetrics and Gynecology

## 2023-05-01 ENCOUNTER — Encounter: Payer: Self-pay | Admitting: Obstetrics and Gynecology

## 2023-05-01 VITALS — BP 112/75 | HR 94 | Ht 66.0 in | Wt 223.0 lb

## 2023-05-01 DIAGNOSIS — Z01419 Encounter for gynecological examination (general) (routine) without abnormal findings: Secondary | ICD-10-CM | POA: Diagnosis not present

## 2023-05-01 NOTE — Progress Notes (Signed)
Patient presents for Annual.  LMP: No cycles w/ IUD  Last pap:  08/29/20 WNL   No pap today. Contraception: IUD:   Mammogram:  02/14/2022 STD Screening: Declines   CC: Annual/None

## 2023-05-01 NOTE — Progress Notes (Signed)
Subjective:     Kristen [name] is a 42 y.o. female P0 with amenorrhea secondary to levonorgestrel IUD and BMI 35 who  is here for a comprehensive physical exam. The patient reports no problems. Patient has had IUD since 2021. She denies any episodes of vaginal bleeding or abnormal discharge. Patient is not currently sexually active. She denies pelvic pain. She denies urinary incontinence or issues with bowel movements  Past Medical History:  Diagnosis Date   Agoraphobia with panic attacks    Anxiety    Depression    Dyslipidemia    not requiring meds   Hernia    Kidney stone    Migraine headache    Nausea vomiting and diarrhea 03/04/2021   SBO (small bowel obstruction) (HCC) 03/04/2021   Past Surgical History:  Procedure Laterality Date   COLONOSCOPY WITH PROPOFOL N/A 05/07/2021   Procedure: COLONOSCOPY WITH PROPOFOL;  Surgeon: Toney Reil, MD;  Location: [facility] ENDOSCOPY;  Service: Gastroenterology;  Laterality: N/A;   COLONOSCOPY WITH PROPOFOL N/A 05/08/2021   Procedure: COLONOSCOPY WITH PROPOFOL;  Surgeon: Toney Reil, MD;  Location: [facility] ENDOSCOPY;  Service: Gastroenterology;  Laterality: N/A;   CYSTOSCOPY/URETEROSCOPY/HOLMIUM LASER/STENT PLACEMENT Right 12/18/2020   Procedure: CYSTOSCOPY/URETEROSCOPY/HOLMIUM LASER/STENT PLACEMENT;  Surgeon: Vanna Scotland, MD;  Location: [facility] ORS;  Service: Urology;  Laterality: Right;   DIAGNOSTIC LAPAROSCOPY     HERNIA REPAIR  05/21/1983   INTRAUTERINE DEVICE INSERTION  01/27/2015   Mirena   LAPAROSCOPIC GASTRIC BANDING N/A 12/26/2012   Procedure: LAPAROSCOPIC GASTRIC BANDING REVISION ;  Surgeon: Atilano Ina, MD;  Location: [facility] ORS;  Service: General;  Laterality: N/A;   TONSILLECTOMY AND ADENOIDECTOMY  05/20/2005   Family History  Problem Relation Age of Onset   Hypertension Father    Stroke Father    Hyperlipidemia Father    Migraines Mother    Cancer Mother        cervical   Hyperlipidemia Mother    Bipolar disorder  Sister    Migraines Sister    Breast cancer Paternal Grandmother 72   Social History   Socioeconomic History   Marital status: Divorced    Spouse name: Not on file   Number of children: Not on file   Years of education: Not on file   Highest education level: Some college, no degree  Occupational History   Not on file  Tobacco Use   Smoking status: Never   Smokeless tobacco: Never  Vaping Use   Vaping status: Never Used  Substance and Sexual Activity   Alcohol use: Not Currently    Alcohol/week: 0.0 standard drinks of alcohol   Drug use: No   Sexual activity: Yes    Birth control/protection: I.U.D.  Other Topics Concern   Not on file  Social History Narrative   Not on file   Social Drivers of Health   Financial Resource Strain: Low Risk  (11/11/2022)   Overall Financial Resource Strain (CARDIA)    Difficulty of Paying Living Expenses: Not very hard  Food Insecurity: No Food Insecurity (11/11/2022)   Hunger Vital Sign    Worried About Running Out of Food in the Last Year: Never true    Ran Out of Food in the Last Year: Never true  Transportation Needs: No Transportation Needs (11/11/2022)   PRAPARE - Administrator, Civil Service (Medical): No    Lack of Transportation (Non-Medical): No  Physical Activity: Insufficiently Active (11/11/2022)   Exercise Vital Sign    Days of  Exercise per Week: 3 days    Minutes of Exercise per Session: 40 min  Stress: Stress Concern Present (11/11/2022)   [employer] of Occupational Health - Occupational Stress Questionnaire    Feeling of Stress : Rather much  Social Connections: Socially Isolated (11/11/2022)   Social Connection and Isolation Panel [NHANES]    Frequency of Communication with Friends and Family: More than three times a week    Frequency of Social Gatherings with Friends and Family: Once a week    Attends Religious Services: Never    Database administrator or Organizations: No    Attends Museum/gallery exhibitions officer: Not on file    Marital Status: Divorced  Intimate Partner Violence: Not on file   Health Maintenance  Topic Date Due   COVID-19 Vaccine (1) Never done   Hepatitis C Screening  Never done   INFLUENZA VACCINE  12/19/2022   Cervical Cancer Screening (HPV/Pap Cotest)  08/29/2025   DTaP/Tdap/Td (9 - Td or Tdap) 03/25/2026   HPV VACCINES  Completed   HIV Screening  Completed       Review of Systems Pertinent items noted in HPI and remainder of comprehensive ROS otherwise negative.   Objective:  Blood pressure 112/75, pulse 94, height 5\' 6"  (1.676 m), weight 223 lb (101.2 kg).   GENERAL: Well-developed, well-nourished female in no acute distress.  HEENT: Normocephalic, atraumatic. Sclerae anicteric.  NECK: Supple. Normal thyroid.  LUNGS: Clear to auscultation bilaterally.  HEART: Regular rate and rhythm. BREASTS: Symmetric in size. No palpable masses or lymphadenopathy, skin changes, or nipple drainage. ABDOMEN: Soft, nontender, nondistended. No organomegaly. PELVIC: Normal external female genitalia. Vagina is pink and rugated.  Normal discharge. Normal appearing cervix. Uterus is normal in size. No adnexal mass or tenderness. Chaperone present during the pelvic exam EXTREMITIES: No cyanosis, clubbing, or edema, 2+ distal pulses.     Assessment:    Healthy female exam.      Plan:    Pap smear normal in 2022 Screening mammogram ordered Health maintenance labs ordered and patient will return in a fasting state for blood collection Patient will be contacted with abnormal results  See After Visit Summary for Counseling Recommendations

## 2023-05-02 ENCOUNTER — Other Ambulatory Visit: Payer: BC Managed Care – PPO

## 2023-05-02 ENCOUNTER — Other Ambulatory Visit: Payer: Self-pay | Admitting: Obstetrics and Gynecology

## 2023-05-02 DIAGNOSIS — Z01419 Encounter for gynecological examination (general) (routine) without abnormal findings: Secondary | ICD-10-CM

## 2023-05-02 DIAGNOSIS — Z1231 Encounter for screening mammogram for malignant neoplasm of breast: Secondary | ICD-10-CM

## 2023-05-03 LAB — HEMOGLOBIN A1C
Est. average glucose Bld gHb Est-mCnc: 97 mg/dL
Hgb A1c MFr Bld: 5 % (ref 4.8–5.6)

## 2023-05-03 LAB — COMPREHENSIVE METABOLIC PANEL
ALT: 21 [IU]/L (ref 0–32)
AST: 20 [IU]/L (ref 0–40)
Albumin: 4.4 g/dL (ref 3.9–4.9)
Alkaline Phosphatase: 88 [IU]/L (ref 44–121)
BUN/Creatinine Ratio: 17 (ref 9–23)
BUN: 10 mg/dL (ref 6–24)
Bilirubin Total: 0.4 mg/dL (ref 0.0–1.2)
CO2: 21 mmol/L (ref 20–29)
Calcium: 9.7 mg/dL (ref 8.7–10.2)
Chloride: 104 mmol/L (ref 96–106)
Creatinine, Ser: 0.6 mg/dL (ref 0.57–1.00)
Globulin, Total: 2.1 g/dL (ref 1.5–4.5)
Glucose: 89 mg/dL (ref 70–99)
Potassium: 4.7 mmol/L (ref 3.5–5.2)
Sodium: 139 mmol/L (ref 134–144)
Total Protein: 6.5 g/dL (ref 6.0–8.5)
eGFR: 114 mL/min/{1.73_m2} (ref >59)

## 2023-05-03 LAB — CBC
Hematocrit: 42 % (ref 34.0–46.6)
Hemoglobin: 13.6 g/dL (ref 11.1–15.9)
MCH: 31 pg (ref 26.6–33.0)
MCHC: 32.4 g/dL (ref 31.5–35.7)
MCV: 96 fL (ref 79–97)
Platelets: 349 10*3/uL (ref 150–450)
RBC: 4.39 x10E6/uL (ref 3.77–5.28)
RDW: 11.7 % (ref 11.7–15.4)
WBC: 6.4 10*3/uL (ref 3.4–10.8)

## 2023-05-03 LAB — LIPID PANEL
Chol/HDL Ratio: 3.2 {ratio} (ref 0.0–4.4)
Cholesterol, Total: 209 mg/dL — ABNORMAL HIGH (ref 100–199)
HDL: 65 mg/dL (ref >39)
LDL Chol Calc (NIH): 131 mg/dL — ABNORMAL HIGH (ref 0–99)
Triglycerides: 75 mg/dL (ref 0–149)
VLDL Cholesterol Cal: 13 mg/dL (ref 5–40)

## 2023-05-03 LAB — TSH: TSH: 0.98 u[IU]/mL (ref 0.450–4.500)

## 2023-05-06 ENCOUNTER — Encounter: Payer: Self-pay | Admitting: Obstetrics and Gynecology

## 2023-05-23 ENCOUNTER — Ambulatory Visit
Admission: RE | Admit: 2023-05-23 | Discharge: 2023-05-23 | Disposition: A | Discharge status: Home / Self Care | Payer: 59 | Source: Ambulatory Visit | Attending: Obstetrics and Gynecology | Admitting: Obstetrics and Gynecology

## 2023-05-23 DIAGNOSIS — Z1231 Encounter for screening mammogram for malignant neoplasm of breast: Secondary | ICD-10-CM | POA: Insufficient documentation

## 2023-08-02 ENCOUNTER — Emergency Department

## 2023-08-02 ENCOUNTER — Emergency Department
Admission: EM | Admit: 2023-08-02 | Discharge: 2023-08-02 | Disposition: A | Discharge status: Other Acute Inpatient Hospital | Attending: Emergency Medicine | Admitting: Emergency Medicine

## 2023-08-02 ENCOUNTER — Emergency Department (HOSPITAL_COMMUNITY): Admission: EM | Admit: 2023-08-02 | Discharge: 2023-08-02 | Disposition: A | Discharge status: Home / Self Care

## 2023-08-02 ENCOUNTER — Other Ambulatory Visit: Payer: Self-pay

## 2023-08-02 ENCOUNTER — Encounter (HOSPITAL_COMMUNITY): Payer: Self-pay | Admitting: *Deleted

## 2023-08-02 DIAGNOSIS — R112 Nausea with vomiting, unspecified: Secondary | ICD-10-CM | POA: Diagnosis present

## 2023-08-02 DIAGNOSIS — D72829 Elevated white blood cell count, unspecified: Secondary | ICD-10-CM | POA: Diagnosis not present

## 2023-08-02 DIAGNOSIS — E860 Dehydration: Secondary | ICD-10-CM | POA: Insufficient documentation

## 2023-08-02 DIAGNOSIS — R1110 Vomiting, unspecified: Secondary | ICD-10-CM

## 2023-08-02 LAB — URINALYSIS, ROUTINE W REFLEX MICROSCOPIC
Bilirubin Urine: NEGATIVE
Glucose, UA: NEGATIVE mg/dL
Ketones, ur: 80 mg/dL — AB
Nitrite: NEGATIVE
Protein, ur: 30 mg/dL — AB
Specific Gravity, Urine: 1.028 (ref 1.005–1.030)
pH: 5 (ref 5.0–8.0)

## 2023-08-02 LAB — COMPREHENSIVE METABOLIC PANEL
ALT: 18 U/L (ref 0–44)
AST: 19 U/L (ref 15–41)
Albumin: 4.2 g/dL (ref 3.5–5.0)
Alkaline Phosphatase: 70 U/L (ref 38–126)
Anion gap: 8 (ref 5–15)
BUN: 9 mg/dL (ref 6–20)
CO2: 22 mmol/L (ref 22–32)
Calcium: 9.3 mg/dL (ref 8.9–10.3)
Chloride: 107 mmol/L (ref 98–111)
Creatinine, Ser: 0.67 mg/dL (ref 0.44–1.00)
GFR, Estimated: >60 mL/min (ref >60)
Glucose, Bld: 90 mg/dL (ref 70–99)
Potassium: 3.9 mmol/L (ref 3.5–5.1)
Sodium: 137 mmol/L (ref 135–145)
Total Bilirubin: 1.4 mg/dL — ABNORMAL HIGH (ref 0.0–1.2)
Total Protein: 7.3 g/dL (ref 6.5–8.1)

## 2023-08-02 LAB — CBC WITH DIFFERENTIAL/PLATELET
Abs Immature Granulocytes: 0.05 10*3/uL (ref 0.00–0.07)
Basophils Absolute: 0 10*3/uL (ref 0.0–0.1)
Basophils Relative: 0 %
Eosinophils Absolute: 0 10*3/uL (ref 0.0–0.5)
Eosinophils Relative: 0 %
HCT: 41.6 % (ref 36.0–46.0)
Hemoglobin: 14.5 g/dL (ref 12.0–15.0)
Immature Granulocytes: 0 %
Lymphocytes Relative: 8 %
Lymphs Abs: 1.2 10*3/uL (ref 0.7–4.0)
MCH: 31.8 pg (ref 26.0–34.0)
MCHC: 34.9 g/dL (ref 30.0–36.0)
MCV: 91.2 fL (ref 80.0–100.0)
Monocytes Absolute: 0.5 10*3/uL (ref 0.1–1.0)
Monocytes Relative: 4 %
Neutro Abs: 13.1 10*3/uL — ABNORMAL HIGH (ref 1.7–7.7)
Neutrophils Relative %: 88 %
Platelets: 326 10*3/uL (ref 150–400)
RBC: 4.56 MIL/uL (ref 3.87–5.11)
RDW: 12.4 % (ref 11.5–15.5)
WBC: 14.9 10*3/uL — ABNORMAL HIGH (ref 4.0–10.5)
nRBC: 0 % (ref 0.0–0.2)

## 2023-08-02 LAB — LIPASE, BLOOD: Lipase: 35 U/L (ref 11–51)

## 2023-08-02 LAB — TROPONIN I (HIGH SENSITIVITY): Troponin I (High Sensitivity): <2 ng/L (ref <18)

## 2023-08-02 LAB — POC URINE PREG, ED: Preg Test, Ur: NEGATIVE

## 2023-08-02 MED ORDER — LACTATED RINGERS IV BOLUS
1000.0000 mL | Freq: Once | INTRAVENOUS | Status: AC
Start: 2023-08-02 — End: 2023-08-02
  Administered 2023-08-02: 1000 mL via INTRAVENOUS

## 2023-08-02 MED ORDER — DIPHENHYDRAMINE HCL 50 MG/ML IJ SOLN
25.0000 mg | Freq: Once | INTRAMUSCULAR | Status: AC
  Administered 2023-08-02: 25 mg via INTRAVENOUS
  Filled 2023-08-02: qty 1

## 2023-08-02 MED ORDER — ONDANSETRON 4 MG PO TBDP
4.0000 mg | ORAL_TABLET | Freq: Once | ORAL | Status: AC
  Administered 2023-08-02: 4 mg via ORAL
  Filled 2023-08-02: qty 1

## 2023-08-02 MED ORDER — KETOROLAC TROMETHAMINE 30 MG/ML IJ SOLN
15.0000 mg | Freq: Once | INTRAMUSCULAR | Status: AC
  Administered 2023-08-02: 15 mg via INTRAVENOUS
  Filled 2023-08-02: qty 1

## 2023-08-02 MED ORDER — PROCHLORPERAZINE EDISYLATE 10 MG/2ML IJ SOLN
10.0000 mg | Freq: Once | INTRAMUSCULAR | Status: AC
  Administered 2023-08-02: 10 mg via INTRAVENOUS
  Filled 2023-08-02: qty 2

## 2023-08-02 MED ORDER — LACTATED RINGERS IV BOLUS
1000.0000 mL | Freq: Once | INTRAVENOUS | Status: AC
  Administered 2023-08-02: 1000 mL via INTRAVENOUS

## 2023-08-02 MED ORDER — ACETAMINOPHEN 500 MG PO TABS
1000.0000 mg | ORAL_TABLET | Freq: Once | ORAL | Status: AC
  Administered 2023-08-02: 1000 mg via ORAL
  Filled 2023-08-02: qty 2

## 2023-08-02 MED ORDER — IOHEXOL 300 MG/ML  SOLN
75.0000 mL | Freq: Once | INTRAMUSCULAR | Status: AC | PRN
  Administered 2023-08-02: 75 mL via INTRAVENOUS

## 2023-08-02 MED ORDER — LIDOCAINE VISCOUS HCL 2 % MT SOLN
15.0000 mL | Freq: Once | OROMUCOSAL | Status: AC
Start: 2023-08-02 — End: 2023-08-02
  Administered 2023-08-02: 15 mL via OROMUCOSAL
  Filled 2023-08-02: qty 15

## 2023-08-02 NOTE — ED Notes (Addendum)
 Patient states she threw up the Tylenol. Dr. Katrinka Blazing informed. Approximately 500 ml of LR has been infused.

## 2023-08-02 NOTE — ED Notes (Signed)
 Instructions given per trauma surgeon  the pt did not want to see the ed doctor  she will follow up as directed by the trauma surgeon

## 2023-08-02 NOTE — ED Triage Notes (Signed)
 Pt seen by the trauma surgeon   call [phone] with results of water

## 2023-08-02 NOTE — ED Triage Notes (Signed)
 The pt has kept down the water she drank she feels better but still has the lt jaw pain

## 2023-08-02 NOTE — Consult Note (Signed)
 Consulting Physician: Hyman Hopes Aliani Caccavale  Referring Provider: Dr. Katrinka Blazing  Chief Complaint: Jaw pain, nausea and vomiting  Reason for Consult: Concern for lap band overfil   Subjective   HPI: Kristen [name] is an 43 y.o. female who is here for jaw pain, nausea and vomiting.  She has a history of lap band by Dr. Andrey Campanile.  She last had a fill last year.  She has been able to eat, and has been gaining some weight.  She has not felt overfilled.  She started having jaw pain, nausea and vomiting and inability to keep anything down starting on Thursday.  This progressed and so she presented to the ER.  After a few bags of fluid, she is feeling better and would not like to adjust the amount of fluid in her band.  Past Medical History:  Diagnosis Date   Agoraphobia with panic attacks    Anxiety    Depression    Dyslipidemia    not requiring meds   Hernia    Kidney stone    Migraine headache    Nausea vomiting and diarrhea 03/04/2021   SBO (small bowel obstruction) (HCC) 03/04/2021    Past Surgical History:  Procedure Laterality Date   COLONOSCOPY WITH PROPOFOL N/A 05/07/2021   Procedure: COLONOSCOPY WITH PROPOFOL;  Surgeon: Toney Reil, MD;  Location: [facility] ENDOSCOPY;  Service: Gastroenterology;  Laterality: N/A;   COLONOSCOPY WITH PROPOFOL N/A 05/08/2021   Procedure: COLONOSCOPY WITH PROPOFOL;  Surgeon: Toney Reil, MD;  [facility];  Service: Gastroenterology;  Laterality: N/A;   CYSTOSCOPY/URETEROSCOPY/HOLMIUM LASER/STENT PLACEMENT Right 12/18/2020   Procedure: CYSTOSCOPY/URETEROSCOPY/HOLMIUM LASER/STENT PLACEMENT;  Surgeon: Vanna Scotland, MD;  Location: [facility] ORS;  Service: Urology;  Laterality: Right;   DIAGNOSTIC LAPAROSCOPY     HERNIA REPAIR  05/21/1983   INTRAUTERINE DEVICE INSERTION  01/27/2015   Mirena   LAPAROSCOPIC GASTRIC BANDING N/A 12/26/2012   Procedure: LAPAROSCOPIC GASTRIC BANDING REVISION ;  Surgeon: Atilano Ina, MD;  Location: [facility] ORS;   Service: General;  Laterality: N/A;   TONSILLECTOMY AND ADENOIDECTOMY  05/20/2005    Family History  Problem Relation Age of Onset   Hypertension Father    Stroke Father    Hyperlipidemia Father    Migraines Mother    Cancer Mother        cervical   Hyperlipidemia Mother    Bipolar disorder Sister    Migraines Sister    Breast cancer Paternal Grandmother 10    Social:  reports that she has never smoked. She has never used smokeless tobacco. She reports that she does not currently use alcohol. She reports that she does not use drugs.  Allergies:  Allergies  Allergen Reactions   Imitrex [Sumatriptan] Nausea And Vomiting    Medications: Current Outpatient Medications  Medication Instructions   ALPRAZolam (XANAX) 1 MG tablet TAKE 1/2 TO 1 TABLET EVERY 4 HOURS AS NEEDED FOR      ANXIETY   amitriptyline (ELAVIL) 100 mg, Oral, Daily at bedtime   HYDROcodone bit-homatropine (HYCODAN) 5-1.5 MG/5ML syrup 5 mLs, Oral, Every 8 hours PRN   levocetirizine (XYZAL) 5 mg, Oral, Every evening   levonorgestrel (MIRENA) 20 MCG/24HR IUD 1 each,  Once   metFORMIN (GLUCOPHAGE) 500 mg, Daily   pantoprazole (PROTONIX) 40 mg, Oral, Daily before breakfast   phentermine (ADIPEX-P) 37.5 mg, Daily before breakfast   sertraline (ZOLOFT) 100 MG tablet TAKE 1 AND 1/2 TABLETS (150MG  TOTAL) DAILY    ROS - all of the  below systems have been reviewed with the patient and positives are indicated with bold text General: chills, fever or night sweats Eyes: blurry vision or double vision ENT: epistaxis or sore throat Allergy/Immunology: itchy/watery eyes or nasal congestion Hematologic/Lymphatic: bleeding problems, blood clots or swollen lymph nodes Endocrine: temperature intolerance or unexpected weight changes Breast: new or changing breast lumps or nipple discharge Resp: cough, shortness of breath, or wheezing CV: chest pain or dyspnea on exertion GI: as per HPI GU: dysuria, trouble voiding, or  hematuria MSK: joint pain or joint stiffness Neuro: TIA or stroke symptoms Derm: pruritus and skin lesion changes Psych: anxiety and depression  Objective   PE Blood pressure 101/65, pulse 100, temperature 98 F (36.7 C), resp. rate 16, SpO2 96%. Constitutional: NAD; conversant; no deformities Eyes: Moist conjunctiva; no lid lag; anicteric; PERRL Neck: Trachea midline; no thyromegaly Lungs: Normal respiratory effort; no tactile fremitus CV: RRR; no palpable thrills; no pitting edema GI: Abd Soft, nontender, band port palpable; no palpable hepatosplenomegaly MSK: Normal range of motion of extremities; no clubbing/cyanosis Psychiatric: Appropriate affect; alert and oriented x3 Lymphatic: No palpable cervical or axillary lymphadenopathy  Results for orders placed or performed during the hospital encounter of 08/02/23 (from the past 24 hours)  CBC with Differential     Status: Abnormal   Collection Time: 08/02/23  2:17 PM  Result Value Ref Range   WBC 14.9 (H) 4.0 - 10.5 K/uL   RBC 4.56 3.87 - 5.11 MIL/uL   Hemoglobin 14.5 12.0 - 15.0 g/dL   HCT 16.1 09.6 - 04.5 %   MCV 91.2 80.0 - 100.0 fL   MCH 31.8 26.0 - 34.0 pg   MCHC 34.9 30.0 - 36.0 g/dL   RDW 40.9 81.1 - 91.4 %   Platelets 326 150 - 400 K/uL   nRBC 0.0 0.0 - 0.2 %   Neutrophils Relative % 88 %   Neutro Abs 13.1 (H) 1.7 - 7.7 K/uL   Lymphocytes Relative 8 %   Lymphs Abs 1.2 0.7 - 4.0 K/uL   Monocytes Relative 4 %   Monocytes Absolute 0.5 0.1 - 1.0 K/uL   Eosinophils Relative 0 %   Eosinophils Absolute 0.0 0.0 - 0.5 K/uL   Basophils Relative 0 %   Basophils Absolute 0.0 0.0 - 0.1 K/uL   Immature Granulocytes 0 %   Abs Immature Granulocytes 0.05 0.00 - 0.07 K/uL  Comprehensive metabolic panel     Status: Abnormal   Collection Time: 08/02/23  2:17 PM  Result Value Ref Range   Sodium 137 135 - 145 mmol/L   Potassium 3.9 3.5 - 5.1 mmol/L   Chloride 107 98 - 111 mmol/L   CO2 22 22 - 32 mmol/L   Glucose, Bld 90 70 -  99 mg/dL   BUN 9 6 - 20 mg/dL   Creatinine, Ser 7.82 0.44 - 1.00 mg/dL   Calcium 9.3 8.9 - 95.6 mg/dL   Total Protein 7.3 6.5 - 8.1 g/dL   Albumin 4.2 3.5 - 5.0 g/dL   AST 19 15 - 41 U/L   ALT 18 0 - 44 U/L   Alkaline Phosphatase 70 38 - 126 U/L   Total Bilirubin 1.4 (H) 0.0 - 1.2 mg/dL   GFR, Estimated >21 >30 mL/min   Anion gap 8 5 - 15  Troponin I (High Sensitivity)     Status: None   Collection Time: 08/02/23  2:17 PM  Result Value Ref Range   Troponin I (High Sensitivity) <2 <18 ng/L  Lipase, blood     Status: None   Collection Time: 08/02/23  2:17 PM  Result Value Ref Range   Lipase 35 11 - 51 U/L  Urinalysis, Routine w reflex microscopic -Urine, Clean Catch     Status: Abnormal   Collection Time: 08/02/23  3:31 PM  Result Value Ref Range   Color, Urine YELLOW (A) YELLOW   APPearance HAZY (A) CLEAR   Specific Gravity, Urine 1.028 1.005 - 1.030   pH 5.0 5.0 - 8.0   Glucose, UA NEGATIVE NEGATIVE mg/dL   Hgb urine dipstick LARGE (A) NEGATIVE   Bilirubin Urine NEGATIVE NEGATIVE   Ketones, ur 80 (A) NEGATIVE mg/dL   Protein, ur 30 (A) NEGATIVE mg/dL   Nitrite NEGATIVE NEGATIVE   Leukocytes,Ua SMALL (A) NEGATIVE   RBC / HPF 0-5 0 - 5 RBC/hpf   WBC, UA 11-20 0 - 5 WBC/hpf   Bacteria, UA RARE (A) NONE SEEN   Squamous Epithelial / HPF 0-5 0 - 5 /HPF   Mucus PRESENT    Hyaline Casts, UA PRESENT   POC urine preg, ED     Status: None   Collection Time: 08/02/23  3:41 PM  Result Value Ref Range   Preg Test, Ur Negative Negative    Imaging Orders  No imaging studies ordered today     Assessment and Plan   Kristen [name] is an 43 y.o. female with a history of lap band who presents with jaw pain, nausea and vomiting.  In general her band has not felt too tight recently, she felt like she got sick on Thursday.  She was very dehydrated on initial evaluation in the ER and symptoms have improved with hydration.  I think she should try to drink some fluids.  If she is able to  drink without nausea/vomiting she could leave on liquid diet with protein shakes and follow up closely with Dr. Andrey Campanile to monitor band tightness and if any adjustments are needed they could be performed in the office.  If she does not tolerate liquids, she will need fluid removed in the ER for a band holiday with office follow up.   Quentin Ore, MD  [facility] Use [email or web address] to contact on call provider  New Patient Billing: [id number] - Moderate MDM

## 2023-08-02 NOTE — ED Notes (Signed)
 Pt progress reported to the  trauma doctor who is comfortable for the pt to go home  unless she wants to be sen by the edp  which she does not   she is comfortable going home

## 2023-08-02 NOTE — ED Notes (Signed)
 Patient had episode of vomiting, small amount of emesis. Dr. Katrinka Blazing aware. Patient uses Listerine and holds it at the back of the mouth on the left side and gets relief.

## 2023-08-02 NOTE — ED Provider Notes (Signed)
 [facility] Provider Note    Event Date/Time   First MD Initiated Contact with Patient 08/02/23 1501     (approximate)   History   Jaw Pain   HPI  Kristen [name] is a 43 y.o. female who presents to the ED for evaluation of Jaw Pain   Patient resents to the ED alongside her father for evaluation of about 3 days severe left mandibular pain.  Nausea and vomiting.  Symptoms started with atraumatic pain to her left mandible, reports pain throughout the left side of her neck below the level of the ear.  Reports hearing/auditory acuity is at baseline.  Vision at baseline, no odynophagia, URI symptoms or mucosal sore throat.  Also reporting recurrent nausea and vomiting and inability to keep much down.  Denies any abdominal pain but has had a history of a Lap-Band procedure, 2012, followed by [facility]  Reports long history of migraines, anxiety but this does not feel a migraine to her.   Physical Exam   Triage Vital Signs: ED Triage Vitals  Encounter Vitals Group     BP 08/02/23 1412 121/77     Systolic BP Percentile --      Diastolic BP Percentile --      Pulse Rate 08/02/23 1412 (!) 127     Resp 08/02/23 1412 18     Temp 08/02/23 1412 98.2 F (36.8 C)     Temp Source 08/02/23 1412 Oral     SpO2 08/02/23 1412 98 %     Weight --      Height --      Head Circumference --      Peak Flow --      Pain Score 08/02/23 1413 7     Pain Loc --      Pain Education --      Exclude from Growth Chart --     Most recent vital signs: Vitals:   08/02/23 1704 08/02/23 1730  BP: 114/75 (!) 108/58  Pulse: (!) 103 86  Resp: 16   Temp:    SpO2: 98% 96%    General: Awake, no distress.  CV:  Good peripheral perfusion.  Resp:  Normal effort.  Abd:  No distention.  Epigastric tenderness is noted, benign lower abdomen. MSK:  No deformity noted.  Neuro:  No focal deficits appreciated. Other:  ENT exam is quite benign: TMs bilaterally  are occluded by impacted cerumen but no signs of external auditory canal inflammation or inflammation to the ear externally.  No tenderness to the mastoid process.  No signs of soft tissue swelling or asymmetry to her neck bilaterally.  No palpable lymph nodes or masses bilaterally, anterior or posterior chain.  Able to open her mouth widely without signs of uvular deviation, mucosal swelling, PTA.  Dentition intact without fractured teeth or signs of periodontal disease.   ED Results / Procedures / Treatments   Labs (all labs ordered are listed, but only abnormal results are displayed) Labs Reviewed  CBC WITH DIFFERENTIAL/PLATELET - Abnormal; Notable for the following components:      Result Value   WBC 14.9 (*)    Neutro Abs 13.1 (*)    All other components within normal limits  COMPREHENSIVE METABOLIC PANEL - Abnormal; Notable for the following components:   Total Bilirubin 1.4 (*)    All other components within normal limits  URINALYSIS, ROUTINE W REFLEX MICROSCOPIC - Abnormal; Notable for the following components:   Color, Urine  YELLOW (*)    APPearance HAZY (*)    Hgb urine dipstick LARGE (*)    Ketones, ur 80 (*)    Protein, ur 30 (*)    Leukocytes,Ua SMALL (*)    Bacteria, UA RARE (*)    All other components within normal limits  LIPASE, BLOOD  POC URINE PREG, ED  TROPONIN I (HIGH SENSITIVITY)    EKG Sinus tachycardia with a rate of 141 bpm.  Normal axis and intervals.  No clear signs of acute ischemia.  RADIOLOGY CT abdomen/pelvis interpreted by me with fluid-filled esophagus and Lap-Band in place. CT soft tissue neck interpreted by me without evidence of acute pathology  Official radiology report(s): CT ABDOMEN PELVIS W CONTRAST Result Date: 08/02/2023 CLINICAL DATA:  LEFT band placement.  Emesis EXAM: CT ABDOMEN AND PELVIS WITH CONTRAST TECHNIQUE: Multidetector CT imaging of the abdomen and pelvis was performed using the standard protocol following bolus  administration of intravenous contrast. RADIATION DOSE REDUCTION: This exam was performed according to the departmental dose-optimization program which includes automated exposure control, adjustment of the mA and/or kV according to patient size and/or use of iterative reconstruction technique. CONTRAST:  75mL OMNIPAQUE IOHEXOL 300 MG/ML  SOLN COMPARISON:  CT abdomen 03/04/2021 FINDINGS: Lower chest: Lung bases are clear. Hepatobiliary: No focal hepatic lesion. Normal gallbladder. No biliary duct dilatation. Common bile duct is normal. Pancreas: Pancreas is normal. No ductal dilatation. No pancreatic inflammation. Spleen: Normal spleen Adrenals/urinary tract: Adrenal glands and kidneys are normal. The ureters and bladder normal. Stomach/Bowel: Gastric banding device in place in the proximal body of the stomach. The fundus of the stomach is above the banding device. This orientation is similar comparison exam. There is fluid within the distal esophagus. The stomach below the date banding device is decompressed. Small bowel normal. There is stool in the ascending colon. LEFT colon normal. The access port for the banding device is in the RIGHT abdominal wall subcutaneous tissue. The access tubing is continuous. Vascular/Lymphatic: Abdominal aorta is normal caliber. No periportal or retroperitoneal adenopathy. No pelvic adenopathy. Reproductive: IUD expected location in the uterus. Other: No free fluid. Musculoskeletal: No aggressive osseous lesion. IMPRESSION: 1. Gastric banding device in the proximal body of the stomach with the fundus above the banding device. Fluid within the esophagus. Stomach is decompressed distal to the banding device. Recommend correlation for obstructive symptoms. 2. No bowel obstruction. 3. Gallbladder normal. Electronically Signed   By: Genevive Bi M.D.   On: 08/02/2023 16:59   CT Soft Tissue Neck W Contrast Result Date: 08/02/2023 CLINICAL DATA:  Severe left-sided neck and  mandibular region pain. EXAM: CT NECK WITH CONTRAST TECHNIQUE: Multidetector CT imaging of the neck was performed using the standard protocol following the bolus administration of intravenous contrast. RADIATION DOSE REDUCTION: This exam was performed according to the departmental dose-optimization program which includes automated exposure control, adjustment of the mA and/or kV according to patient size and/or use of iterative reconstruction technique. CONTRAST:  75mL OMNIPAQUE IOHEXOL 300 MG/ML  SOLN COMPARISON:  None Available. FINDINGS: Pharynx and larynx: No sign of mucosal or submucosal mass or inflammatory disease. Parapharyngeal space is negative. The esophagus is seen to be dilated and fluid-filled beginning at the thoracic inlet. Salivary glands: Parotid and submandibular glands are normal. No stone, mass or inflammatory change. Thyroid: Normal Lymph nodes: No lymphadenopathy on either side of the neck. Normal bilateral cervical chain nodes. Vascular: No abnormal vascular finding. Limited intracranial: Normal Visualized orbits: Normal Mastoids and visualized paranasal sinuses: Clear  Skeleton: No evidence of bone, joint or dental pathology. Upper chest: Dilated fluid-filled esophagus as noted above. Other: None IMPRESSION: 1. Dilated fluid-filled esophagus beginning at the thoracic inlet. 2. No other abnormality seen to explain the clinical presentation. No evidence of salivary gland stone, mass or inflammatory change. No lymphadenopathy. No mucosal or submucosal disease. No bone, joint or dental finding. Electronically Signed   By: Paulina Fusi M.D.   On: 08/02/2023 16:34    PROCEDURES and INTERVENTIONS:  Procedures  Medications  ondansetron (ZOFRAN-ODT) disintegrating tablet 4 mg (4 mg Oral Given 08/02/23 1418)  ketorolac (TORADOL) 30 MG/ML injection 15 mg (15 mg Intravenous Given 08/02/23 1557)  acetaminophen (TYLENOL) tablet 1,000 mg (1,000 mg Oral Given 08/02/23 1548)  prochlorperazine  (COMPAZINE) injection 10 mg (10 mg Intravenous Given 08/02/23 1602)  diphenhydrAMINE (BENADRYL) injection 25 mg (25 mg Intravenous Given 08/02/23 1554)  lactated ringers bolus 1,000 mL (0 mLs Intravenous Stopped 08/02/23 1800)  iohexol (OMNIPAQUE) 300 MG/ML solution 75 mL (75 mLs Intravenous Contrast Given 08/02/23 1625)  lidocaine (XYLOCAINE) 2 % viscous mouth solution 15 mL (15 mLs Mouth/Throat Given 08/02/23 1707)  lactated ringers bolus 1,000 mL (1,000 mLs Intravenous New Bag/Given 08/02/23 1800)     IMPRESSION / MDM / ASSESSMENT AND PLAN / ED COURSE  I reviewed the triage vital signs and the nursing notes.  Differential diagnosis includes, but is not limited to, cluster headache, migraine headache, cholecystitis, SBO, sialoadenitis, mastoiditis, periodontal disease  {Patient presents with symptoms of an acute illness or injury that is potentially life-threatening.  Patient presents to the ED with recurrent emesis and signs of her Lap-Band possibly being too tight causing obstructive pathology.  We will have to transfer her to [facility] to facilitate pediatric surgery adjusting this as this cannot be done at our facility.   She also presents with some atypical left-sided head and neck discomfort that I suspect is more related to headache/migraine, follows with Toradol and has not recurred and she is reassuring underlying CT.  Ketones in the urine suggest dehydration, mild leukocytosis without infectious etiology suspected.  Normal metabolic panel, lipase and troponins.  Clinical Course as of 08/02/23 1831  Sat Aug 02, 2023  1722 Reassessed.  Headache and jaw pain is resolved but she still is nauseous and has thrown up multiple times.  CT is noted.  We will consult with surgery [DS]  1742 I consult with both Dr. Claudine Mouton and Dr. Mia Creek, surgery and GI, neither have experience with lap bands or other adjustment.  They recommend I discussed with patient's primary surgical team, [facility].  I have the secretary reach out to them for me [DS]  1757 I talk with our secretaries, apparently [facility] isn't answering. She's tried calling the phone number on [employer], but it "just rings and rings and rings" and no one answers. We will at least try the  [DS]  1801 I consult with Dr. Dossie Der, he's on call for [facility], he is happy to help adjust her lap band but she'll need to be transferred. I talk with patient and father, they're preferring to drive by POV and I think that's very reasonable. EMS transport seems excessive. Will reach out to the ER at [facility] for transfer ED to ED, by POV [DS]  1821 Benjiman Core accepts [DS]    Clinical Course User Index [DS] Delton Prairie, MD     FINAL CLINICAL IMPRESSION(S) / ED DIAGNOSES   Final diagnoses:  Nausea and  vomiting, unspecified vomiting type  Dehydration     Rx / DC Orders   ED Discharge Orders     None        Note:  This document was prepared using Dragon voice recognition software and may include unintentional dictation errors.   Delton Prairie, MD 08/02/23 [phone]

## 2023-08-02 NOTE — ED Notes (Signed)
 Patient states she feels better. Patient is not vomiting at this time. Father remains at bedside.

## 2023-08-02 NOTE — ED Triage Notes (Signed)
 Pt c/o left jaw pain x2 days and n/v, SHOB, since yesterday. Pt is AOX4, NAD noted. Pt denies exposure to known illness.

## 2023-08-02 NOTE — ED Provider Notes (Signed)
 Patient seen by Surgery and not ED provider   Benjiman Core, MD 08/02/23 2120

## 2024-04-09 ENCOUNTER — Telehealth: Admitting: Family Medicine

## 2024-04-09 DIAGNOSIS — F32A Depression, unspecified: Secondary | ICD-10-CM

## 2024-04-09 DIAGNOSIS — F410 Panic disorder [episodic paroxysmal anxiety] without agoraphobia: Secondary | ICD-10-CM

## 2024-04-09 DIAGNOSIS — F411 Generalized anxiety disorder: Secondary | ICD-10-CM

## 2024-04-09 DIAGNOSIS — G43009 Migraine without aura, not intractable, without status migrainosus: Secondary | ICD-10-CM | POA: Diagnosis not present

## 2024-04-09 DIAGNOSIS — F419 Anxiety disorder, unspecified: Secondary | ICD-10-CM

## 2024-04-09 MED ORDER — ALPRAZOLAM 1 MG PO TABS: ORAL_TABLET | ORAL | 2 refills | Status: AC

## 2024-04-09 MED ORDER — SERTRALINE HCL 100 MG PO TABS: ORAL_TABLET | ORAL | 3 refills | Status: AC

## 2024-04-09 MED ORDER — AMITRIPTYLINE HCL 100 MG PO TABS: 100.0000 mg | ORAL_TABLET | Freq: Every day | ORAL | 3 refills | Status: AC

## 2024-04-09 NOTE — Progress Notes (Signed)
 MyChart Video Visit    Virtual Visit via Video Note   This format is felt to be most appropriate for this patient at this time. Physical exam was limited by quality of the video and audio technology used for the visit.   Patient location: home Provider location: [facility]  I discussed the limitations of evaluation and management by telemedicine and the availability of in person appointments. The patient expressed understanding and agreed to proceed.  Patient: Kristen [name]   DOB: [birth year]   43 y.o. Female  MRN: [id number] Visit Date: 04/09/2024  Today's healthcare provider: Nancyann Perry, MD    Subjective    Discussed the use of AI scribe software for clinical note transcription with the patient, who gave verbal consent to proceed.  History of Present Illness   Kristen [name] is a 43 year old female who presents for her yearly medication refills.  She is seeking refills for sertraline , amitriptyline , and alprazolam .  She takes amitriptyline  daily for headaches, experiencing severe headaches approximately once every two months and milder headaches more frequently. She notes a constant headache presence, but the medication helps manage the severity. No dizziness, wooziness, or sleepiness unless she forgets to take her medication.  She takes sertraline  for stress and anxiety at a dose of 150 mg daily. She has considered increasing the dose due to job-related stress and anxiety but has opted to see her therapist more frequently instead.  She requests a refill for alprazolam  for anxiety. The past year has been challenging with increased depression and significant weight gain. She initially suspected amitriptyline  as the cause of the weight gain but continues it due to its effectiveness for headaches.  She has previously tried phentermine for weight management and is interested in possibly resuming it in the future.       Medications: Outpatient Medications Prior to Visit  Medication  Sig   ALPRAZolam  (XANAX ) 1 MG tablet TAKE 1/2 TO 1 TABLET EVERY 4 HOURS AS NEEDED FOR      ANXIETY   amitriptyline  (ELAVIL ) 100 MG tablet Take 1 tablet (100 mg total) by mouth at bedtime.   levonorgestrel  (MIRENA ) 20 MCG/24HR IUD 1 each by Intrauterine route once. Inserted 01/21/2020   pantoprazole  (PROTONIX ) 40 MG tablet Take 1 tablet (40 mg total) by mouth daily before breakfast.   phentermine (ADIPEX-P) 37.5 MG tablet Take 37.5 mg by mouth daily before breakfast.   sertraline  (ZOLOFT ) 100 MG tablet TAKE 1 AND 1/2 TABLETS (150MG  TOTAL) DAILY   [DISCONTINUED] HYDROcodone  bit-homatropine (HYCODAN) 5-1.5 MG/5ML syrup Take 5 mLs by mouth every 8 (eight) hours as needed for cough.   [DISCONTINUED] levocetirizine (XYZAL ) 5 MG tablet Take 1 tablet (5 mg total) by mouth every evening.   [DISCONTINUED] metFORMIN (GLUCOPHAGE) 500 MG tablet Take 500 mg by mouth daily.   No facility-administered medications prior to visit.   Review of Systems     Objective    There were no vitals taken for this visit.   Physical Exam  Awake, alert, oriented x 3. In no apparent distress   Assessment & Plan        Chronic headache Chronic headaches managed with amitriptyline . Occasional severe headaches occur approximately once every two months, with milder headaches more frequent. - Continue amitriptyline  for headache management. - Refilled amitriptyline  prescription through mail order pharmacy.  Depression and generalized anxiety disorder Managed with sertraline  and alprazolam . Current sertraline  dose is 150 mg daily. She reports increased stress and anxiety, possibly related to  job stress. No desire to increase sertraline  dose at this time. - Continue sertraline  150 mg daily. - Refilled sertraline  prescription through mail order pharmacy. - Refilled alprazolam  prescription through mail order pharmacy.  Abnormal weight gain Significant weight gain reported, possibly related to medication side effects.  Both sertraline  and amitriptyline  can contribute to weight gain, though amitriptyline  is more likely. - Discussed potential restart of phentermine for weight management.         I discussed the assessment and treatment plan with the patient. The patient was provided an opportunity to ask questions and all were answered. The patient agreed with the plan and demonstrated an understanding of the instructions.   The patient was advised to call back or seek an in-person evaluation if the symptoms worsen or if the condition fails to improve as anticipated.  I provided 15 minutes of non-face-to-face time during this encounter.   Nancyann Perry, MD [facility] [phone] (phone) [phone] (fax)  [facility]

## 2024-04-10 ENCOUNTER — Encounter: Payer: Self-pay | Admitting: Family Medicine

## 2024-04-10 DIAGNOSIS — K219 Gastro-esophageal reflux disease without esophagitis: Secondary | ICD-10-CM

## 2024-04-12 MED ORDER — RABEPRAZOLE SODIUM 20 MG PO TBEC: 20.0000 mg | DELAYED_RELEASE_TABLET | Freq: Every day | ORAL | 1 refills | Status: AC

## 2024-06-25 ENCOUNTER — Other Ambulatory Visit: Payer: Self-pay | Admitting: Family Medicine

## 2024-06-25 DIAGNOSIS — Z1231 Encounter for screening mammogram for malignant neoplasm of breast: Secondary | ICD-10-CM

## 2024-07-15 ENCOUNTER — Encounter
